# Patient Record
Sex: Female | Born: 1935
Health system: Southern US, Community
[De-identification: ages and names within clinical notes are randomized; demographics above are authoritative.]

## PROBLEM LIST (undated history)

## (undated) DIAGNOSIS — M1990 Unspecified osteoarthritis, unspecified site: Secondary | ICD-10-CM

## (undated) DIAGNOSIS — R3129 Other microscopic hematuria: Secondary | ICD-10-CM

## (undated) DIAGNOSIS — I10 Essential (primary) hypertension: Secondary | ICD-10-CM

## (undated) DIAGNOSIS — N35919 Unspecified urethral stricture, male, unspecified site: Secondary | ICD-10-CM

## (undated) DIAGNOSIS — B373 Candidiasis of vulva and vagina: Secondary | ICD-10-CM

## (undated) DIAGNOSIS — E663 Overweight: Secondary | ICD-10-CM

## (undated) DIAGNOSIS — E119 Type 2 diabetes mellitus without complications: Secondary | ICD-10-CM

## (undated) DIAGNOSIS — C50919 Malignant neoplasm of unspecified site of unspecified female breast: Secondary | ICD-10-CM

## (undated) DIAGNOSIS — B3731 Acute candidiasis of vulva and vagina: Secondary | ICD-10-CM

## (undated) DIAGNOSIS — Z923 Personal history of irradiation: Secondary | ICD-10-CM

## (undated) DIAGNOSIS — N1330 Unspecified hydronephrosis: Secondary | ICD-10-CM

## (undated) DIAGNOSIS — R109 Unspecified abdominal pain: Secondary | ICD-10-CM

## (undated) DIAGNOSIS — N952 Postmenopausal atrophic vaginitis: Secondary | ICD-10-CM

## (undated) DIAGNOSIS — K219 Gastro-esophageal reflux disease without esophagitis: Secondary | ICD-10-CM

## (undated) DIAGNOSIS — N3941 Urge incontinence: Secondary | ICD-10-CM

## (undated) HISTORY — DX: Unspecified hydronephrosis: N13.30

## (undated) HISTORY — DX: Candidiasis of vulva and vagina: B37.3

## (undated) HISTORY — DX: Urge incontinence: N39.41

## (undated) HISTORY — PX: TONSILLECTOMY: SUR1361

## (undated) HISTORY — PX: CATARACT EXTRACTION, BILATERAL: SHX1313

## (undated) HISTORY — DX: Other microscopic hematuria: R31.29

## (undated) HISTORY — DX: Unspecified abdominal pain: R10.9

## (undated) HISTORY — PX: OTHER SURGICAL HISTORY: SHX169

## (undated) HISTORY — DX: Type 2 diabetes mellitus without complications: E11.9

## (undated) HISTORY — PX: ABDOMINAL HYSTERECTOMY: SHX81

## (undated) HISTORY — PX: APPENDECTOMY: SHX54

## (undated) HISTORY — DX: Acute candidiasis of vulva and vagina: B37.31

## (undated) HISTORY — PX: CHOLECYSTECTOMY: SHX55

## (undated) HISTORY — DX: Overweight: E66.3

## (undated) HISTORY — PX: FOOT SURGERY: SHX648

## (undated) HISTORY — PX: GALLBLADDER SURGERY: SHX652

## (undated) HISTORY — DX: Postmenopausal atrophic vaginitis: N95.2

## (undated) HISTORY — DX: Unspecified urethral stricture, male, unspecified site: N35.919

---

## 1998-09-26 ENCOUNTER — Ambulatory Visit (HOSPITAL_COMMUNITY): Admission: RE | Admit: 1998-09-26 | Discharge: 1998-09-26 | Payer: Self-pay | Admitting: Neurosurgery

## 1998-09-26 ENCOUNTER — Encounter: Payer: Self-pay | Admitting: Neurosurgery

## 1998-11-17 ENCOUNTER — Encounter: Payer: Self-pay | Admitting: Neurosurgery

## 1998-11-19 ENCOUNTER — Ambulatory Visit (HOSPITAL_COMMUNITY): Admission: RE | Admit: 1998-11-19 | Discharge: 1998-11-20 | Payer: Self-pay | Admitting: Neurosurgery

## 1998-11-19 ENCOUNTER — Encounter: Payer: Self-pay | Admitting: Neurosurgery

## 2004-03-14 ENCOUNTER — Encounter: Payer: Self-pay | Admitting: Podiatry

## 2004-04-02 ENCOUNTER — Ambulatory Visit: Payer: Self-pay | Admitting: Gastroenterology

## 2004-04-14 ENCOUNTER — Encounter: Payer: Self-pay | Admitting: Podiatry

## 2005-09-01 ENCOUNTER — Ambulatory Visit: Payer: Self-pay | Admitting: Internal Medicine

## 2005-09-23 ENCOUNTER — Other Ambulatory Visit: Payer: Self-pay

## 2005-09-23 ENCOUNTER — Inpatient Hospital Stay: Payer: Self-pay | Admitting: Podiatry

## 2006-03-17 ENCOUNTER — Other Ambulatory Visit: Payer: Self-pay

## 2006-03-17 ENCOUNTER — Ambulatory Visit: Payer: Self-pay | Admitting: Podiatry

## 2006-04-01 ENCOUNTER — Ambulatory Visit: Payer: Self-pay | Admitting: Podiatry

## 2006-09-07 ENCOUNTER — Ambulatory Visit: Payer: Self-pay | Admitting: Internal Medicine

## 2006-09-12 ENCOUNTER — Other Ambulatory Visit: Payer: Self-pay

## 2006-09-12 ENCOUNTER — Ambulatory Visit: Payer: Self-pay | Admitting: General Practice

## 2006-09-22 ENCOUNTER — Inpatient Hospital Stay: Payer: Self-pay | Admitting: General Practice

## 2007-06-30 ENCOUNTER — Ambulatory Visit: Payer: Self-pay | Admitting: Podiatry

## 2007-11-07 ENCOUNTER — Ambulatory Visit: Payer: Self-pay | Admitting: Internal Medicine

## 2008-08-13 ENCOUNTER — Ambulatory Visit: Payer: Self-pay | Admitting: General Practice

## 2008-08-26 ENCOUNTER — Inpatient Hospital Stay: Payer: Self-pay | Admitting: General Practice

## 2008-12-12 ENCOUNTER — Ambulatory Visit: Payer: Self-pay | Admitting: Internal Medicine

## 2009-06-14 DIAGNOSIS — C50919 Malignant neoplasm of unspecified site of unspecified female breast: Secondary | ICD-10-CM

## 2009-06-14 HISTORY — DX: Malignant neoplasm of unspecified site of unspecified female breast: C50.919

## 2009-06-14 HISTORY — PX: BREAST BIOPSY: SHX20

## 2010-02-09 ENCOUNTER — Ambulatory Visit: Payer: Self-pay | Admitting: Internal Medicine

## 2010-02-12 ENCOUNTER — Ambulatory Visit: Payer: Self-pay | Admitting: Internal Medicine

## 2010-03-10 ENCOUNTER — Ambulatory Visit: Payer: Self-pay | Admitting: Surgery

## 2010-03-12 LAB — PATHOLOGY REPORT

## 2010-03-14 ENCOUNTER — Ambulatory Visit: Payer: Self-pay | Admitting: Oncology

## 2010-03-18 ENCOUNTER — Ambulatory Visit: Payer: Self-pay | Admitting: Oncology

## 2010-03-19 LAB — CANCER ANTIGEN 27.29: CA 27.29: 37.7 U/mL (ref 0.0–38.6)

## 2010-03-23 ENCOUNTER — Ambulatory Visit: Payer: Self-pay | Admitting: Internal Medicine

## 2010-04-01 ENCOUNTER — Ambulatory Visit: Payer: Self-pay | Admitting: Surgery

## 2010-04-07 ENCOUNTER — Ambulatory Visit: Payer: Self-pay | Admitting: Surgery

## 2010-04-11 LAB — PATHOLOGY REPORT

## 2010-04-14 ENCOUNTER — Ambulatory Visit: Payer: Self-pay | Admitting: Oncology

## 2010-05-14 ENCOUNTER — Ambulatory Visit: Payer: Self-pay | Admitting: Oncology

## 2010-06-14 ENCOUNTER — Ambulatory Visit: Payer: Self-pay | Admitting: Oncology

## 2010-07-15 ENCOUNTER — Ambulatory Visit: Payer: Self-pay | Admitting: Oncology

## 2010-08-13 ENCOUNTER — Ambulatory Visit: Payer: Self-pay | Admitting: Oncology

## 2010-09-13 ENCOUNTER — Ambulatory Visit: Payer: Self-pay | Admitting: Oncology

## 2010-09-30 ENCOUNTER — Ambulatory Visit: Payer: Self-pay | Admitting: Gastroenterology

## 2010-11-11 ENCOUNTER — Ambulatory Visit: Payer: Self-pay | Admitting: Internal Medicine

## 2010-11-13 ENCOUNTER — Ambulatory Visit: Payer: Self-pay | Admitting: Internal Medicine

## 2011-01-27 ENCOUNTER — Ambulatory Visit: Payer: Self-pay | Admitting: Internal Medicine

## 2011-02-13 ENCOUNTER — Ambulatory Visit: Payer: Self-pay | Admitting: Internal Medicine

## 2011-04-14 ENCOUNTER — Ambulatory Visit: Payer: Self-pay | Admitting: Internal Medicine

## 2011-05-28 ENCOUNTER — Ambulatory Visit: Payer: Self-pay | Admitting: Internal Medicine

## 2011-06-15 ENCOUNTER — Ambulatory Visit: Payer: Self-pay | Admitting: Internal Medicine

## 2011-08-04 ENCOUNTER — Ambulatory Visit: Payer: Self-pay | Admitting: Internal Medicine

## 2011-08-13 ENCOUNTER — Ambulatory Visit: Payer: Self-pay | Admitting: Internal Medicine

## 2011-11-24 ENCOUNTER — Ambulatory Visit: Payer: Self-pay | Admitting: Internal Medicine

## 2011-11-24 LAB — HEPATIC FUNCTION PANEL A (ARMC)
Albumin: 3.7 g/dL (ref 3.4–5.0)
Alkaline Phosphatase: 108 U/L (ref 50–136)
Bilirubin, Direct: 0.1 mg/dL (ref 0.00–0.20)
Bilirubin,Total: 0.3 mg/dL (ref 0.2–1.0)
SGOT(AST): 36 U/L (ref 15–37)
SGPT (ALT): 41 U/L
Total Protein: 7.4 g/dL (ref 6.4–8.2)

## 2011-11-24 LAB — CBC CANCER CENTER
Basophil #: 0 x10 3/mm (ref 0.0–0.1)
Basophil %: 0.6 %
Eosinophil #: 0.2 x10 3/mm (ref 0.0–0.7)
Eosinophil %: 3.1 %
HCT: 36 % (ref 35.0–47.0)
HGB: 12.2 g/dL (ref 12.0–16.0)
Lymphocyte #: 2.1 x10 3/mm (ref 1.0–3.6)
Lymphocyte %: 38.6 %
MCH: 32.1 pg (ref 26.0–34.0)
MCHC: 33.8 g/dL (ref 32.0–36.0)
MCV: 95 fL (ref 80–100)
Monocyte #: 0.4 x10 3/mm (ref 0.2–0.9)
Monocyte %: 8 %
Neutrophil #: 2.7 x10 3/mm (ref 1.4–6.5)
Neutrophil %: 49.7 %
Platelet: 153 x10 3/mm (ref 150–440)
RBC: 3.79 10*6/uL — ABNORMAL LOW (ref 3.80–5.20)
RDW: 13.7 % (ref 11.5–14.5)
WBC: 5.5 x10 3/mm (ref 3.6–11.0)

## 2011-11-24 LAB — CREATININE, SERUM
EGFR (African American): 44 — ABNORMAL LOW
EGFR (Non-African Amer.): 38 — ABNORMAL LOW

## 2011-12-13 ENCOUNTER — Ambulatory Visit: Payer: Self-pay | Admitting: Internal Medicine

## 2012-04-18 ENCOUNTER — Ambulatory Visit: Payer: Self-pay | Admitting: Internal Medicine

## 2012-04-20 ENCOUNTER — Ambulatory Visit: Payer: Self-pay | Admitting: Internal Medicine

## 2012-05-08 ENCOUNTER — Ambulatory Visit: Payer: Self-pay | Admitting: Surgery

## 2012-05-09 LAB — PATHOLOGY REPORT

## 2012-06-25 ENCOUNTER — Ambulatory Visit: Payer: Self-pay | Admitting: Internal Medicine

## 2012-06-26 LAB — HEPATIC FUNCTION PANEL A (ARMC)
Alkaline Phosphatase: 86 U/L (ref 50–136)
Bilirubin,Total: 0.4 mg/dL (ref 0.2–1.0)
SGOT(AST): 40 U/L — ABNORMAL HIGH (ref 15–37)
SGPT (ALT): 40 U/L (ref 12–78)
Total Protein: 7.3 g/dL (ref 6.4–8.2)

## 2012-06-26 LAB — CBC CANCER CENTER
Basophil #: 0 x10 3/mm (ref 0.0–0.1)
Basophil %: 0.5 %
Eosinophil #: 0.2 x10 3/mm (ref 0.0–0.7)
HCT: 35.9 % (ref 35.0–47.0)
HGB: 12.7 g/dL (ref 12.0–16.0)
MCH: 33.6 pg (ref 26.0–34.0)
MCV: 95 fL (ref 80–100)
Platelet: 151 x10 3/mm (ref 150–440)
RBC: 3.77 10*6/uL — ABNORMAL LOW (ref 3.80–5.20)
RDW: 13.8 % (ref 11.5–14.5)
WBC: 5.9 x10 3/mm (ref 3.6–11.0)

## 2012-06-26 LAB — CREATININE, SERUM: Creatinine: 1.32 mg/dL — ABNORMAL HIGH (ref 0.60–1.30)

## 2012-07-15 ENCOUNTER — Ambulatory Visit: Payer: Self-pay | Admitting: Internal Medicine

## 2012-10-11 ENCOUNTER — Ambulatory Visit: Payer: Self-pay

## 2012-11-07 ENCOUNTER — Ambulatory Visit: Payer: Self-pay | Admitting: Surgery

## 2012-11-21 ENCOUNTER — Ambulatory Visit: Payer: Self-pay | Admitting: Podiatry

## 2012-11-21 LAB — BASIC METABOLIC PANEL
Anion Gap: 6 — ABNORMAL LOW (ref 7–16)
Calcium, Total: 9.4 mg/dL (ref 8.5–10.1)
Creatinine: 1.01 mg/dL (ref 0.60–1.30)
EGFR (Non-African Amer.): 54 — ABNORMAL LOW
Osmolality: 289 (ref 275–301)

## 2012-11-22 ENCOUNTER — Ambulatory Visit: Payer: Self-pay | Admitting: Podiatry

## 2012-11-24 LAB — PATHOLOGY REPORT

## 2013-02-07 ENCOUNTER — Ambulatory Visit: Payer: Self-pay | Admitting: Ophthalmology

## 2013-02-07 DIAGNOSIS — I10 Essential (primary) hypertension: Secondary | ICD-10-CM

## 2013-02-19 ENCOUNTER — Ambulatory Visit: Payer: Self-pay | Admitting: Ophthalmology

## 2013-02-23 ENCOUNTER — Ambulatory Visit: Payer: Self-pay | Admitting: Internal Medicine

## 2013-02-26 LAB — CBC CANCER CENTER
Eosinophil %: 4.4 %
HCT: 36.4 % (ref 35.0–47.0)
HGB: 12.7 g/dL (ref 12.0–16.0)
Lymphocyte #: 1.8 x10 3/mm (ref 1.0–3.6)
Lymphocyte %: 32.7 %
MCH: 33.6 pg (ref 26.0–34.0)
MCHC: 35 g/dL (ref 32.0–36.0)
Neutrophil #: 3.1 x10 3/mm (ref 1.4–6.5)
Platelet: 156 x10 3/mm (ref 150–440)
RDW: 14.3 % (ref 11.5–14.5)

## 2013-02-26 LAB — CREATININE, SERUM
EGFR (African American): 39 — ABNORMAL LOW
EGFR (Non-African Amer.): 34 — ABNORMAL LOW

## 2013-02-26 LAB — HEPATIC FUNCTION PANEL A (ARMC)
Albumin: 3.9 g/dL (ref 3.4–5.0)
Alkaline Phosphatase: 100 U/L (ref 50–136)
Bilirubin, Direct: 0.1 mg/dL (ref 0.00–0.20)
Bilirubin,Total: 0.4 mg/dL (ref 0.2–1.0)
SGOT(AST): 41 U/L — ABNORMAL HIGH (ref 15–37)

## 2013-03-08 ENCOUNTER — Ambulatory Visit: Payer: Self-pay | Admitting: Ophthalmology

## 2013-03-14 ENCOUNTER — Ambulatory Visit: Payer: Self-pay | Admitting: Internal Medicine

## 2013-03-19 ENCOUNTER — Ambulatory Visit: Payer: Self-pay | Admitting: Ophthalmology

## 2013-04-19 ENCOUNTER — Ambulatory Visit: Payer: Self-pay | Admitting: Internal Medicine

## 2013-06-29 ENCOUNTER — Ambulatory Visit: Payer: Self-pay | Admitting: Urology

## 2013-07-18 DIAGNOSIS — Z853 Personal history of malignant neoplasm of breast: Secondary | ICD-10-CM | POA: Insufficient documentation

## 2014-03-06 ENCOUNTER — Ambulatory Visit: Payer: Self-pay | Admitting: Internal Medicine

## 2014-03-06 LAB — HEPATIC FUNCTION PANEL A (ARMC)
Albumin: 3.5 g/dL (ref 3.4–5.0)
Alkaline Phosphatase: 89 U/L
BILIRUBIN TOTAL: 0.4 mg/dL (ref 0.2–1.0)
Bilirubin, Direct: <0.05 mg/dL (ref 0.00–0.20)
SGOT(AST): 35 U/L (ref 15–37)
SGPT (ALT): 45 U/L
TOTAL PROTEIN: 7.1 g/dL (ref 6.4–8.2)

## 2014-03-06 LAB — CREATININE, SERUM
Creatinine: 1.44 mg/dL — ABNORMAL HIGH (ref 0.60–1.30)
EGFR (African American): 46 — ABNORMAL LOW
EGFR (Non-African Amer.): 38 — ABNORMAL LOW

## 2014-03-06 LAB — CBC CANCER CENTER
BASOS ABS: 0 x10 3/mm (ref 0.0–0.1)
Basophil %: 0.6 %
EOS ABS: 0.2 x10 3/mm (ref 0.0–0.7)
EOS PCT: 3.9 %
HCT: 36.9 % (ref 35.0–47.0)
HGB: 12.4 g/dL (ref 12.0–16.0)
Lymphocyte #: 2.1 x10 3/mm (ref 1.0–3.6)
Lymphocyte %: 34.3 %
MCH: 32.3 pg (ref 26.0–34.0)
MCHC: 33.6 g/dL (ref 32.0–36.0)
MCV: 96 fL (ref 80–100)
MONO ABS: 0.4 x10 3/mm (ref 0.2–0.9)
Monocyte %: 7.1 %
Neutrophil #: 3.4 x10 3/mm (ref 1.4–6.5)
Neutrophil %: 54.1 %
Platelet: 178 x10 3/mm (ref 150–440)
RBC: 3.85 10*6/uL (ref 3.80–5.20)
RDW: 14.6 % — ABNORMAL HIGH (ref 11.5–14.5)
WBC: 6.2 x10 3/mm (ref 3.6–11.0)

## 2014-03-14 ENCOUNTER — Ambulatory Visit: Payer: Self-pay | Admitting: Internal Medicine

## 2014-04-22 ENCOUNTER — Ambulatory Visit: Payer: Self-pay | Admitting: Internal Medicine

## 2014-10-04 NOTE — Op Note (Signed)
PATIENT NAME:  Danielle Nixon, Danielle Nixon MR#:  [id number] DATE OF BIRTH:  Oct 20, 1935  DATE OF PROCEDURE:  02/19/2013  PREOPERATIVE DIAGNOSIS:  Cataract, left eye.    POSTOPERATIVE DIAGNOSIS:  Cataract, left eye.  PROCEDURE PERFORMED: Extracapsular cataract extraction using phacoemulsification with placement Alcon [id number], 21.0-diopter posterior chamber lens, serial number [id number].   SURGEON: Loura Back. Sharlee Rufino, M.D.   ANESTHESIA: 4% lidocaine and 0.75% Marcaine. A 50:50 mixture with 10 units/mL of HyoMax added, given this peribulbar.   COMPLICATIONS: None.   ESTIMATED BLOOD LOSS: Less than 1 mL.   ANESTHESIOLOGIST:  Dr. Andree Elk.  COMPLICATIONS:  None.  ESTIMATED BLOOD LOSS:  Less than 1 ml.  DESCRIPTION OF PROCEDURE:  The patient was brought to the operating room and given a peribulbar block.  The patient was then prepped and draped in the usual fashion.  The vertical rectus muscles were imbricated using 5-0 silk sutures.  These sutures were then clamped to the sterile drapes as bridle sutures.  A limbal peritomy was performed extending two clock hours and hemostasis was obtained with cautery.  A partial thickness scleral groove was made at the surgical limbus and dissected anteriorly in a lamellar dissection using an Alcon crescent knife.  The anterior chamber was entered supero-temporally with a Superblade and through the lamellar dissection with a 2.6 mm keratome.  DisCoVisc was used to replace the aqueous and a continuous tear capsulorrhexis was carried out.  Hydrodissection and hydrodelineation were carried out with balanced salt and a 27 gauge canula.  The nucleus was rotated to confirm the effectiveness of the hydrodissection.  Phacoemulsification was carried out using a divide-and-conquer technique.  Total ultrasound time was 1 minute and 6 seconds with an average power of 22.4%. A CDE of 26.30. No suture was placed .  Irrigation/aspiration was used to remove the residual cortex.   DisCoVisc was used to inflate the capsule and the internal incision was enlarged to 3 mm with the crescent knife.  The intraocular lens was folded and inserted into the capsular bag using the AcrySert delivery system.  Irrigation/aspiration was used to remove the residual DisCoVisc.  Miostat was injected into the anterior chamber through the paracentesis track to inflate the anterior chamber and induce miosis. One tenth of one mL of Cefuroxime delivery 1 mg of drug was placed in the anterior chamber.  The wound was checked for leaks and none were found. The conjunctiva was closed with cautery and the bridle sutures were removed.  Two drops of 0.3% Vigamox were placed on the eye.   An eye shield was placed on the eye.  The patient was discharged to the recovery room in good condition.   ____________________________ Loura Back Jovee Dettinger, MD sad:np D: 02/19/2013 14:05:37 ET T: 02/19/2013 15:30:44 ET JOB#: [id number]  cc: Remo Lipps A. Keelon Zurn, MD, <Dictator> Martie Lee MD ELECTRONICALLY SIGNED 02/26/2013 13:12

## 2014-10-04 NOTE — Op Note (Signed)
PATIENT NAME:  Danielle, Nixon MR#:  [id number] DATE OF BIRTH:  06-Jul-1935  DATE OF PROCEDURE:  11/22/2012  PREOPERATIVE DIAGNOSES: 1.  Equinus right lower extremity.  2.  Ulcer, right plantar first metatarsophalangeal joint.   POSTOPERATIVE DIAGNOSES:   1.  Equinus right lower extremity.  2.  Ulcer, right plantar first metatarsophalangeal joint.   PROCEDURES: 1.  Strayer gastroc recession right leg.  2.  Excision tibial sesamoid, right first metatarsophalangeal joint with debridement of metatarsal head.   ANESTHESIA: IV sedation with local.   HEMOSTASIS: Epinephrine 1: 200,000 infiltrated along both incision sites.   COMPLICATIONS: None.   SPECIMEN: Tibial sesamoid, for pathology.   OPERATIVE INDICATIONS: This is a 79 year old female who I have seen in the outpatient clinic with a chronic nonhealing right great toe joint ulceration. She has undergone extensive conservative treatment and presents today for surgery. All risks, benefits, alternatives, and complications associated with surgery were discussed with the patient in full and consent has been given.   OPERATIVE PROCEDURE: The patient was brought into the operating room and placed on the operating table in the supine position. IV sedation was administered by the anesthesia team. The patient was then placed on the OR table in the prone position. The posterior aspect of the calf and plantar aspect of the foot was infiltrated with a total of 14 mL of 1% lidocaine with epinephrine. The right lower extremity was then prepped and draped in the usual sterile fashion. Attention was directed to the posterior aspect of the calf, where a longitudinal incision was made at the gastroc soleal junction. Sharp and blunt dissection was taken down to the peritenon. The small saphenous and sural nerves were noted and retracted throughout the procedure. The paratenon was then resected. The gastroc soleal junction was noted and a transfer Strayer  recession was performed with the deep muscle belly noted at this time. The wound was flushed with copious amounts of irrigation. Layered closure was performed with  4-0 Vicryl for the peritenon, 4-0 Vicryl for the subcutaneous tissue and a 4-0 nylon for the skin. A dressing was then applied overlying this area. Attention was then directed to the plantar aspect of the right first metatarsophalangeal joint where a longitudinal incision was made over the tibial sesamoid region. Sharp and blunt dissection was carried down to the deeper layers. The long flexor tendon was noted at this time and retracted throughout the procedure. Next, the tibial sesamoid was then noted and all soft tissues were sharply removed away. A small cuff of the plantar aspect of the tibial sesamoid was left intact to reanastomose this area. The tibial sesamoid was then removed in toto. These joint was flushed. There was a small prominent bone  in the area and I debrided this down with a rasp. This wound was then flushed with copious amounts of irrigation and layered closure was performed with a 4-0 Vicryl for the deeper and subcutaneous tissue and a 3-0 nylon for skin. A well compressive sterile dressing was placed on the patient's right foot and she was then placed in an equalizer walker boot. She will remain nonweightbearing to her right foot and given the fact she has plantar ulcer. I will see her in the outpatient clinic in 5 to 7 days. She is to call if there are any questions in the interim    ____________________________ Pete Glatter. Vickki Muff, DPM jaf:cc D: 11/22/2012 14:52:34 ET T: 11/22/2012 19:44:26 ET JOB#: [id number]  cc: Larkin Ina A. Vickki Muff, DPM, <Dictator> Margreat Widener  Deashia Soule DPM ELECTRONICALLY SIGNED 11/29/2012 11:52

## 2014-10-04 NOTE — Op Note (Signed)
PATIENT NAME:  Danielle Nixon, Danielle Nixon MR#:  [id number] DATE OF BIRTH:  06-Mar-1936  DATE OF PROCEDURE:  03/19/2013  PREOPERATIVE DIAGNOSIS: Cataract, right eye.   POSTOPERATIVE DIAGNOSIS: Cataract, right eye.   PROCEDURE PERFORMED: Extracapsular cataract extraction using phacoemulsification with placement of an Alcon SN6CWS, 21.0-diopter posterior chamber lens, serial number [id number].   ANESTHESIA: 4% lidocaine 0.75% Marcaine a 50/50 mixture with 10 units/mL of Hylenex  added given as a peribulbar.   ANESTHESIOLOGIST: Dr. Benjamine Mola.   COMPLICATIONS: None.   ESTIMATED BLOOD LOSS: Less than 1 mL.   SURGEON:  Loura Back. Tamatha Gadbois, MD  ASSISTANT:  None.  COMPLICATIONS:  None.  ESTIMATED BLOOD LOSS:  Less than 1 ml.  DESCRIPTION OF PROCEDURE:  The patient was brought to the operating room and given a peribulbar block.  The patient was then prepped and draped in the usual fashion.  The vertical rectus muscles were imbricated using 5-0 silk sutures.  These sutures were then clamped to the sterile drapes as bridle sutures.  A limbal peritomy was performed extending 2 clock-hours and hemostasis was obtained with cautery.  A partial-thickness scleral groove was made at the surgical limbus and then dissected anteriorly in a lamellar dissection with using an Alcon crescent knife.  The anterior chamber was entered superonasally with a Superblade and through the lamellar dissection with a 2.6-mm keratome.  DisCoVisc was used to replace the aqueous and a continuous tear capsulorrhexis was carried out.  Hydrodissection and hydrodelineation were carried out with balanced salt and a 27-gauge cannula.  The nucleus was rotated to confirm the effectiveness of the hydrodissection.  Phacoemulsification was carried out using a divide-and-conquer technique.  Ultrasound time was 1 minute and 1 second with an average power of 22.8%. CDE of 24.27. No suture was placed. An AcrySert delivery system was used.    Irrigation/aspiration was used to remove the residual cortex.  DisCoVisc was used to inflate the capsule and the internal wound was enlarged to 3 mm with the crescent knife.  The intraocular lens was inserted into the capsular bag using the [employer].  Irrigation/aspiration was used to remove the residual DisCoVisc.  Miostat was injected into the anterior chamber through the paracentesis track to inflate the anterior chamber and induce miosis.  The wound was checked for leaks and wound leakage was found.  A single 10-0 suture was placed across the incision, tied and the knot was rotated superiorly.  The conjunctiva was closed with cautery and the bridle sutures were removed.  Two drops of 0.3% Vigamox were placed on the eye.  An eye shield was placed on the eye.  The patient was discharged to the recovery room in good condition.  No antibiotics were injected.    ____________________________ Loura Back. Gean Larose, MD sad:dm D: 03/19/2013 13:13:02 ET T: 03/19/2013 14:04:34 ET JOB#: [id number]  cc: Remo Lipps A. Ardie Dragoo, MD, <Dictator> Martie Lee MD ELECTRONICALLY SIGNED 04/02/2013 12:25

## 2015-03-07 ENCOUNTER — Inpatient Hospital Stay: Payer: Commercial Managed Care - HMO

## 2015-03-07 ENCOUNTER — Inpatient Hospital Stay: Payer: Commercial Managed Care - HMO | Admitting: Internal Medicine

## 2015-03-14 ENCOUNTER — Inpatient Hospital Stay: Payer: Commercial Managed Care - HMO | Attending: Internal Medicine

## 2015-03-14 ENCOUNTER — Other Ambulatory Visit: Payer: Self-pay | Admitting: *Deleted

## 2015-03-14 ENCOUNTER — Inpatient Hospital Stay (HOSPITAL_BASED_OUTPATIENT_CLINIC_OR_DEPARTMENT_OTHER): Payer: Commercial Managed Care - HMO | Admitting: Internal Medicine

## 2015-03-14 VITALS — BP 118/78 | HR 112 | Temp 98.4°F | Resp 18 | Ht 69.0 in | Wt 225.5 lb

## 2015-03-14 DIAGNOSIS — E669 Obesity, unspecified: Secondary | ICD-10-CM | POA: Insufficient documentation

## 2015-03-14 DIAGNOSIS — I10 Essential (primary) hypertension: Secondary | ICD-10-CM | POA: Insufficient documentation

## 2015-03-14 DIAGNOSIS — Z79811 Long term (current) use of aromatase inhibitors: Secondary | ICD-10-CM | POA: Insufficient documentation

## 2015-03-14 DIAGNOSIS — M8580 Other specified disorders of bone density and structure, unspecified site: Secondary | ICD-10-CM

## 2015-03-14 DIAGNOSIS — Z170 Estrogen receptor positive status [ER+]: Secondary | ICD-10-CM | POA: Diagnosis not present

## 2015-03-14 DIAGNOSIS — E785 Hyperlipidemia, unspecified: Secondary | ICD-10-CM | POA: Diagnosis not present

## 2015-03-14 DIAGNOSIS — C50911 Malignant neoplasm of unspecified site of right female breast: Secondary | ICD-10-CM

## 2015-03-14 LAB — CBC WITH DIFFERENTIAL/PLATELET
BASOS ABS: 0 10*3/uL (ref 0–0.1)
Basophils Relative: 1 %
Eosinophils Absolute: 0.2 10*3/uL (ref 0–0.7)
Eosinophils Relative: 4 %
HEMATOCRIT: 36.3 % (ref 35.0–47.0)
HEMOGLOBIN: 12.5 g/dL (ref 12.0–16.0)
LYMPHS PCT: 33 %
Lymphs Abs: 2 10*3/uL (ref 1.0–3.6)
MCH: 32 pg (ref 26.0–34.0)
MCHC: 34.5 g/dL (ref 32.0–36.0)
MCV: 92.6 fL (ref 80.0–100.0)
MONO ABS: 0.5 10*3/uL (ref 0.2–0.9)
MONOS PCT: 9 %
NEUTROS ABS: 3.3 10*3/uL (ref 1.4–6.5)
Neutrophils Relative %: 53 %
Platelets: 179 10*3/uL (ref 150–440)
RBC: 3.92 MIL/uL (ref 3.80–5.20)
RDW: 14.2 % (ref 11.5–14.5)
WBC: 6.1 10*3/uL (ref 3.6–11.0)

## 2015-03-14 LAB — CREATININE, SERUM
Creatinine, Ser: 1.27 mg/dL — ABNORMAL HIGH (ref 0.44–1.00)
GFR calc Af Amer: 46 mL/min — ABNORMAL LOW (ref >60)
GFR calc non Af Amer: 40 mL/min — ABNORMAL LOW (ref >60)

## 2015-03-14 LAB — HEPATIC FUNCTION PANEL
ALK PHOS: 76 U/L (ref 38–126)
ALT: 25 U/L (ref 14–54)
AST: 30 U/L (ref 15–41)
Albumin: 4.2 g/dL (ref 3.5–5.0)
BILIRUBIN TOTAL: 0.6 mg/dL (ref 0.3–1.2)
Total Protein: 7.6 g/dL (ref 6.5–8.1)

## 2015-03-14 NOTE — Progress Notes (Signed)
Patient is here for follow-up of breast cancer. She states that she still has bad neuropathy, but her medication is helping. She states that she forgot to take it one night and her neuropathy was very bad, so she know that her medicine is working.

## 2015-03-23 NOTE — Progress Notes (Signed)
[facility]  [phone] [phone]     ID: Danielle Nixon OB: 1936/02/18  MR#: [id number]  GDJ#:[id number]  Patient Care Team: Lottie Mussel III, MD as PCP - General (Internal Medicine)  CHIEF COMPLAINT/DIAGNOSIS:  pT2 pN0 (sn) cM0 invasive ductal carcinoma of the right breast status post lumpectomy and sentinel node study on April 07, 2010. Tumor size 2.7 cm, margins negative, grade 1.  Two sentinel lymph nodes negative. ER and PR positive. HER-2/neu negative (2+ on IHC, FISH negative, low Her2/CEP17 ratio of 1.15). Oncotype DX test on 04/22/10 showed low-risk recurrence score of 11 (average rate of distant recurrence of 7%). Started aromatase inhibitor in Feb 2012.   HPI:   Patient here for continued oncology follow-up for history of breast cancer as described above, she was last seen 1 year ago. Overall states that she is doing fairly steady. She denies feeling any new breast masses on self exam.  She is on anastrozole, denies any new side effects from it. Appetite and weight are steady. Denies any new bone pains, has chronic arthritis which is unchanged. Denies feeling new breast masses on self-exam. No new DOE, cough or chest pain.  Denies new mood disturbances.   Review of Systems   As in HPI above. In addition, no fevers, chills or night sweats. No sore throat or dysphagia.  No new cough, shortness of breath, sputum, hemoptysis or chest pain. No abdominal pain, constipation, diarrhea, dysuria or hematuria. No new bone pain. No skin rash or bleeding symptoms. Has chronic peripheral neuropathy. PS ECOG 1. Past Medical History/Past Surgical History -  Hypertension  Hyperlipidemia  Chronic peripheral neuropathy of unclear etiology (since 1995, affects legs/feet more than hands)  Obesity  Hysterectomy  Cholecystectomy  Appendectomy  Lumbar laminectomy  Status post total bilateral knee replacement  Previous left urethra stricture    Family  History -  Denies breast, ovarian or colon cancers.  Remarkable for diabetes, hypertension and heart disease.    Social History -  Denies smoking or alcohol use.  Ambulates slowly because of peripheral neuropathy.   Allergies:  Penicillin: Rash  Sulfa: Rash  Cipro: Rash  Sudafed: Agitation, Other  Macrobid: Rash  Codeine: Unknown    ROS Current Outpatient Prescriptions  Medication Sig Dispense Refill  . amitriptyline (ELAVIL) 50 MG tablet TAKE 1 TABLET EVERY DAY    . anastrozole (ARIMIDEX) 1 MG tablet Take by mouth.    Marland Kitchen atorvastatin (LIPITOR) 10 MG tablet TAKE 1 TABLET ONE TIME DAILY  FOR  CHOLESTEROL    . gabapentin (NEURONTIN) 300 MG capsule TAKE 2 CAPSULES THREE TIMES DAILY    . tetracycline (ACHROMYCIN,SUMYCIN) 250 MG capsule TAKE 1 CAPSULE (250 MG TOTAL) BY MOUTH 4 (FOUR) TIMES DAILY.  0  . valsartan-hydrochlorothiazide (DIOVAN-HCT) 80-12.5 MG tablet TAKE 1 TABLET EVERY DAY     No current facility-administered medications for this visit.    PHYSICAL EXAM: Filed Vitals:   03/14/15 1436  BP: 118/78  Pulse: 112  Temp: 98.4 F (36.9 C)  Resp: 18     Body mass index is 33.29 kg/(m^2).      GENERAL: Patient is alert and oriented and in no acute distress. There is no icterus. HEENT: EOMs intact. No cervical lymphadenopathy. CVS: S1S2, regular LUNGS: Bilaterally clear to auscultation, no rhonchi. ABDOMEN: Soft, nontender. No hepatomegaly clinically.  EXTREMITIES: No pedal edema. BREASTS: no abnormal masses in either breast. No axillary adenopathy. Exam performed in presence of a nurse.   LAB  RESULTS:    Component Value Date/Time   NA 141 11/21/2012 1417   K 4.3 03/08/2013 1154   CL 106 11/21/2012 1417   CO2 29 11/21/2012 1417   GLUCOSE 135* 11/21/2012 1417   BUN 30* 11/21/2012 1417   CREATININE 1.27* 03/14/2015 1416   CREATININE 1.44* 03/06/2014 1415   CALCIUM 9.4 11/21/2012 1417   PROT 7.6 03/14/2015 1416   PROT 7.1 03/06/2014 1415   ALBUMIN 4.2 03/14/2015  1416   ALBUMIN 3.5 03/06/2014 1415   AST 30 03/14/2015 1416   AST 35 03/06/2014 1415   ALT 25 03/14/2015 1416   ALT 45 03/06/2014 1415   ALKPHOS 76 03/14/2015 1416   ALKPHOS 89 03/06/2014 1415   BILITOT 0.6 03/14/2015 1416   BILITOT 0.4 03/06/2014 1415   GFRNONAA 40* 03/14/2015 1416   GFRNONAA 38* 03/06/2014 1415   GFRNONAA 34* 02/26/2013 1404   GFRAA 46* 03/14/2015 1416   GFRAA 46* 03/06/2014 1415   GFRAA 39* 02/26/2013 1404    Lab Results  Component Value Date   WBC 6.1 03/14/2015   NEUTROABS 3.3 03/14/2015   HGB 12.5 03/14/2015   HCT 36.3 03/14/2015   MCV 92.6 03/14/2015   PLT 179 03/14/2015    STUDIES: 04/22/14 - Mammogram.  IMPRESSION: No evidence of recurrent or new malignancy. Benign postsurgical changes on the right. RECOMMENDATION: Diagnostic mammography in 1 year per standard post lumpectomy protocol.  BI-RADS CATEGORY  2: Benign Finding(s)   ASSESSMENT / PLAN:   pT2 pN0 (sn) cM0 invasive ductal carcinoma of the right breast status post lumpectomy and sentinel node study on April 07, 2010. Tumor size 2.7 cm, margins negative, grade 1.  Two sentinel lymph nodes negative. ER and PR positive, HER-2/neu negative (2+ on IHC with negative FISH). Oncotype DX test on 04/22/10 showed low-risk recurrence score of 11 (average rate of distant recurrence of 7%).  Started aromatase inhibitor in Feb 2012.  -    reviewed labs and discussed with patient. She continues to do well without any clinical evidence to suggest recurrent or metastatic breast cancer. She underwent a right breast biopsy November 2013 for microcalcifications which was negative for atypia or malignancy. She also had followup mammogram in Nov 2015 which was reported benign findings, BI-RADS 2.  Patient is otherwise tolerating anastrazole well without any new side effects, plan is to continue on hormonal therapy with anastrazole 1 mg daily, have advised her to stop this after February 2017 since she will complete 5 years  of planned treatment. She was advised to continue calcium + vitamin D BID. Prior DEXA scan showed osteopenia, states that she is following with primary physician regarding this. We will request next mammogram for November 2016. Will see her back in one year with repeat labs including CBC, creatinine, LFT. In between visits, she was advised to call in case of any new breast masses on self-exam or new side effects from anastrazole. Patient is agreeable to this plan.   Leia Alf, MD   03/23/2015 4:24 PM

## 2015-06-17 ENCOUNTER — Ambulatory Visit (INDEPENDENT_AMBULATORY_CARE_PROVIDER_SITE_OTHER): Payer: PPO | Admitting: Urology

## 2015-06-17 ENCOUNTER — Encounter: Payer: Self-pay | Admitting: *Deleted

## 2015-06-17 VITALS — BP 136/83 | HR 120 | Ht 69.0 in | Wt 228.0 lb

## 2015-06-17 DIAGNOSIS — N390 Urinary tract infection, site not specified: Secondary | ICD-10-CM | POA: Diagnosis not present

## 2015-06-17 DIAGNOSIS — R3915 Urgency of urination: Secondary | ICD-10-CM | POA: Diagnosis not present

## 2015-06-17 DIAGNOSIS — R32 Unspecified urinary incontinence: Secondary | ICD-10-CM

## 2015-06-17 DIAGNOSIS — N952 Postmenopausal atrophic vaginitis: Secondary | ICD-10-CM | POA: Diagnosis not present

## 2015-06-17 DIAGNOSIS — N3941 Urge incontinence: Secondary | ICD-10-CM | POA: Diagnosis not present

## 2015-06-17 LAB — URINALYSIS, COMPLETE
Bilirubin, UA: NEGATIVE
Glucose, UA: NEGATIVE
KETONES UA: NEGATIVE
NITRITE UA: POSITIVE — AB
PH UA: 6 (ref 5.0–7.5)
Protein, UA: NEGATIVE
Specific Gravity, UA: 1.02 (ref 1.005–1.030)
Urobilinogen, Ur: 0.2 mg/dL (ref 0.2–1.0)

## 2015-06-17 LAB — MICROSCOPIC EXAMINATION: RBC MICROSCOPIC, UA: NONE SEEN /HPF (ref 0–?)

## 2015-06-17 LAB — BLADDER SCAN AMB NON-IMAGING: Scan Result: 23

## 2015-06-17 NOTE — Progress Notes (Signed)
06/17/2015 12:06 PM   Rae Lips 1936/01/17 [id number]:[id number]  Referring provider: Madelyn Brunner, MD [facility] [place], [place] [address]  Chief Complaint  Patient presents with  . Urinary Incontinence    1 year recheck    HPI: Patient is a 80 year old Caucasian female who presents early for her one year follow up due to UTI symptoms.    Patient states that last week she developed the acute onset of dysuria.  The dysuria occurred consistently for three days and she contacted her PCP, Dr. Gilford Rile, and he phoned in tetracycline.  She had been on the antibiotic for three days with minimal improvement in her urinary symptoms.  She has since stopped the antibiotic.  She has not had fevers, chills, nausea or vomiting.  She is not experiencing gross hematuria or suprapubic pain.  Her UA today is nitrate positive.    Patient also has a history of urge incontinence for which she was given Vesicare samples.  She does not recall if the Vesicare samples were effective.  She is still experiencing severe urge incontinence.  She states as soon as her feet hit the floor to use the restroom, she leaks.  This is very frustrating to her.  Her PVR's have been minimal.  Today's PVR is 23 mL.    She has a history of atrophic vaginitis and recurrent UTI's, but she has not been using her vaginal estrogen cream consistently.     PMH: Past Medical History  Diagnosis Date  . Heart disease   . Flank pain   . DM (diabetes mellitus) ([place])   . Urge incontinence   . Atrophic vaginitis   . Vaginal yeast infection   . Over weight   . Hydronephrosis   . Hematuria, microscopic   . Urethral stricture     Surgical History: Past Surgical History  Procedure Laterality Date  . Tonsillectomy    . Dixon flap ureteroscopy    . Gallbladder surgery    . Appendectomy    . Foot surgery    . Abdominal hysterectomy      Home Medications:    Medication List       This list  is accurate as of: 06/17/15 12:06 PM.  Always use your most recent med list.               amitriptyline 50 MG tablet  Commonly known as:  ELAVIL  TAKE 1 TABLET EVERY DAY     anastrozole 1 MG tablet  Commonly known as:  ARIMIDEX  Take by mouth.     atorvastatin 10 MG tablet  Commonly known as:  LIPITOR  TAKE 1 TABLET ONE TIME DAILY  FOR  CHOLESTEROL     gabapentin 300 MG capsule  Commonly known as:  NEURONTIN  TAKE 2 CAPSULES THREE TIMES DAILY     tetracycline 250 MG capsule  Commonly known as:  ACHROMYCIN,SUMYCIN  TAKE 1 CAPSULE (250 MG TOTAL) BY MOUTH 4 (FOUR) TIMES DAILY.     valsartan-hydrochlorothiazide 80-12.5 MG tablet  Commonly known as:  DIOVAN-HCT  TAKE 1 TABLET EVERY DAY        Allergies:  Allergies  Allergen Reactions  . Ciprofloxacin Nausea And Vomiting  . Doxycycline Other (See Comments)  . Nitrofurantoin Monohyd Macro Other (See Comments)  . Nsaids Other (See Comments)  . Penicillins Other (See Comments)    Has taken recently without problems  . Pseudoephedrine Hcl Other (See Comments)  .  Sulfa Antibiotics   . Terazosin Other (See Comments)    Family History: Family History  Problem Relation Age of Onset  . Diabetes    . Breast cancer    . Heart disease    . Kidney disease Neg Hx   . Bladder Cancer Neg Hx     Social History:  reports that she has never smoked. She does not have any smokeless tobacco history on file. She reports that she does not drink alcohol or use illicit drugs.  ROS: UROLOGY Frequent Urination?: Yes Hard to postpone urination?: Yes Burning/pain with urination?: Yes Get up at night to urinate?: No Leakage of urine?: Yes Urine stream starts and stops?: No Trouble starting stream?: No Do you have to strain to urinate?: No Blood in urine?: No Urinary tract infection?: Yes Sexually transmitted disease?: No Injury to kidneys or bladder?: No Painful intercourse?: No Weak stream?: No Currently pregnant?: No Vaginal  bleeding?: No Last menstrual period?: n  Gastrointestinal Nausea?: No Vomiting?: No Indigestion/heartburn?: Yes Diarrhea?: No Constipation?: No  Constitutional Fever: No Night sweats?: No Weight loss?: No Fatigue?: Yes  Skin Skin rash/lesions?: No Itching?: No  Eyes Blurred vision?: No Double vision?: No  Ears/Nose/Throat Sore throat?: No Sinus problems?: No  Hematologic/Lymphatic Swollen glands?: No Easy bruising?: No  Cardiovascular Leg swelling?: No Chest pain?: No  Respiratory Cough?: Yes Shortness of breath?: No  Endocrine Excessive thirst?: No  Musculoskeletal Back pain?: No Joint pain?: No  Neurological Headaches?: No Dizziness?: No  Psychologic Depression?: No Anxiety?: No  Physical Exam: BP 136/83 mmHg  Pulse 120  Ht 5\' 9"  (1.753 m)  Wt 228 lb (103.42 kg)  BMI 33.65 kg/m2  Constitutional: Well nourished. Alert and oriented, No acute distress. HEENT: [place] AT, moist mucus membranes. Trachea midline, no masses. Cardiovascular: No clubbing, cyanosis, or edema. Respiratory: Normal respiratory effort, no increased work of breathing. GI: Abdomen is soft, non tender, non distended, no abdominal masses. Liver and spleen not palpable.  No hernias appreciated.  Stool sample for occult testing is not indicated.   GU: No CVA tenderness.  No bladder fullness or masses.  Skin: No rashes, bruises or suspicious lesions. Lymph: No cervical or inguinal adenopathy. Neurologic: Grossly intact, no focal deficits, moving all 4 extremities. Psychiatric: Normal mood and affect.  Laboratory Data: Lab Results  Component Value Date   WBC 6.1 03/14/2015   HGB 12.5 03/14/2015   HCT 36.3 03/14/2015   MCV 92.6 03/14/2015   PLT 179 03/14/2015    Lab Results  Component Value Date   CREATININE 1.27* 03/14/2015    Urinalysis Results for orders placed or performed in visit on 06/17/15  Microscopic Examination  Result Value Ref Range   WBC, UA 11-30 (A) 0  -  5 /hpf   RBC, UA None seen 0 -  2 /hpf   Epithelial Cells (non renal) 0-10 0 - 10 /hpf   Mucus, UA Present (A) Not Estab.   Bacteria, UA Few (A) None seen/Few  Urinalysis, Complete  Result Value Ref Range   Specific Gravity, UA 1.020 1.005 - 1.030   pH, UA 6.0 5.0 - 7.5   Color, UA Yellow Yellow   Appearance Ur Clear Clear   Leukocytes, UA 2+ (A) Negative   Protein, UA Negative Negative/Trace   Glucose, UA Negative Negative   Ketones, UA Negative Negative   RBC, UA Trace (A) Negative   Bilirubin, UA Negative Negative   Urobilinogen, Ur 0.2 0.2 - 1.0 mg/dL   Nitrite, UA  Positive (A) Negative   Microscopic Examination See below:   BLADDER SCAN AMB NON-IMAGING  Result Value Ref Range   Scan Result 23     Pertinent Imaging: Results for CARMELITE, SHARE (MRN [id number]) as of 06/17/2015 14:14  Ref. Range 06/17/2015 11:45  Scan Result Unknown 23    Assessment & Plan:    1. UTI:    Patient is with UTI symptoms and has been on an antibiotic without improvement.  Her UA is suspicious of an infection.  I will send the urine for culture and she will continue the tetracycline until culture results are available.    - Urinalysis, Complete - CULTURE, URINE COMPREHENSIVE  2. Urge incontinence:   Patient's PVR's have been minimal.  She cannot recall the effect of the Vesicare.  I will start Myrbetriq 25 mg daily.  Samples are given to the patient.  She will follow up in one month for PVR and symptom recheck.    - BLADDER SCAN AMB NON-IMAGING  3. Atrophic vaginitis:   Patient has not been using her vaginal estrogen cream consistently.  I have given her samples of the Premarin cream.  We will exam her when she returns in one month.    Return in about 1 month (around 07/18/2015) for PVR, UA and exam.  Zara Council, PA-C  [facility] [address] [place], [place] [address] [phone]

## 2015-06-19 ENCOUNTER — Telehealth: Payer: Self-pay

## 2015-06-19 DIAGNOSIS — N390 Urinary tract infection, site not specified: Secondary | ICD-10-CM

## 2015-06-19 LAB — CULTURE, URINE COMPREHENSIVE

## 2015-06-19 MED ORDER — FOSFOMYCIN TROMETHAMINE 3 G PO PACK: PACK | ORAL | Status: DC

## 2015-06-19 NOTE — Telephone Encounter (Signed)
-----   Message from Nori Riis, PA-C sent at 06/19/2015  1:19 PM EST ----- Patient has a positive urine culture, unfortunately she is allergic to many antibiotics.  I would like to try Monurol 3 gm, every other day for three doses then recheck her urine 3 to 5 days after she finishes her antibiotic.

## 2015-06-19 NOTE — Telephone Encounter (Signed)
Spoke with pt in reference to +ucx. Pt voiced understanding. Pt will RTC on 07/01/15 for a cath specimen. Medication sent to pt pharmacy.

## 2015-07-01 ENCOUNTER — Ambulatory Visit (INDEPENDENT_AMBULATORY_CARE_PROVIDER_SITE_OTHER): Payer: PPO

## 2015-07-01 DIAGNOSIS — N390 Urinary tract infection, site not specified: Secondary | ICD-10-CM | POA: Diagnosis not present

## 2015-07-01 LAB — MICROSCOPIC EXAMINATION: RBC, UA: NONE SEEN /hpf (ref 0–?)

## 2015-07-01 LAB — URINALYSIS, COMPLETE
BILIRUBIN UA: NEGATIVE
GLUCOSE, UA: NEGATIVE
KETONES UA: NEGATIVE
NITRITE UA: NEGATIVE
Protein, UA: NEGATIVE
RBC UA: NEGATIVE
SPEC GRAV UA: 1.02 (ref 1.005–1.030)
Urobilinogen, Ur: 0.2 mg/dL (ref 0.2–1.0)
pH, UA: 5.5 (ref 5.0–7.5)

## 2015-07-01 NOTE — Progress Notes (Signed)
In and Out Catheterization  Patient is present today for a I & O catheterization due to post abx therapy. Patient was cleaned and prepped in a sterile fashion with betadine and Lidocaine 2% jelly was instilled into the urethra.  A 14FR cath was inserted no complications were noted , 165ml of urine return was noted, urine was clear and yellow in color. A clean urine sample was collected for u/a and cx. Bladder was drained  And catheter was removed with out difficulty.    Preformed by: Toniann Fail, LPN

## 2015-07-03 LAB — CULTURE, URINE COMPREHENSIVE

## 2015-07-04 ENCOUNTER — Telehealth: Payer: Self-pay

## 2015-07-04 NOTE — Telephone Encounter (Signed)
-----   Message from Nori Riis, PA-C sent at 07/03/2015  3:54 PM EST ----- Please tell the patient that her urine culture is negative.  We will see her on the 7th of February.

## 2015-07-04 NOTE — Telephone Encounter (Signed)
Spoke with pt in reference to -ucx. Pt voiced understanding.  

## 2015-07-22 ENCOUNTER — Encounter: Payer: Self-pay | Admitting: Urology

## 2015-07-22 ENCOUNTER — Ambulatory Visit (INDEPENDENT_AMBULATORY_CARE_PROVIDER_SITE_OTHER): Payer: PPO | Admitting: Urology

## 2015-07-22 VITALS — BP 103/67 | HR 130 | Ht 69.0 in | Wt 227.5 lb

## 2015-07-22 DIAGNOSIS — N3941 Urge incontinence: Secondary | ICD-10-CM

## 2015-07-22 DIAGNOSIS — N952 Postmenopausal atrophic vaginitis: Secondary | ICD-10-CM

## 2015-07-22 LAB — URINALYSIS, COMPLETE
BILIRUBIN UA: NEGATIVE
GLUCOSE, UA: NEGATIVE
KETONES UA: NEGATIVE
NITRITE UA: NEGATIVE
PROTEIN UA: NEGATIVE
RBC, UA: NEGATIVE
SPEC GRAV UA: 1.025 (ref 1.005–1.030)
UUROB: 0.2 mg/dL (ref 0.2–1.0)
pH, UA: 5.5 (ref 5.0–7.5)

## 2015-07-22 LAB — MICROSCOPIC EXAMINATION

## 2015-07-22 LAB — BLADDER SCAN AMB NON-IMAGING: SCAN RESULT: 17

## 2015-07-22 MED ORDER — ESTROGENS, CONJUGATED 0.625 MG/GM VA CREA: 1.0000 | TOPICAL_CREAM | Freq: Every day | VAGINAL | Status: DC

## 2015-07-22 MED ORDER — ESTRADIOL 0.1 MG/GM VA CREA: TOPICAL_CREAM | VAGINAL | Status: DC

## 2015-07-22 NOTE — Progress Notes (Signed)
10:35 PM   Danielle Nixon 11/14/1935 [id number]  Referring provider: Madelyn Brunner, MD [facility] [place], [place] [address]  Chief Complaint  Patient presents with  . Urge Incontinence    1 month follow up  . Vaginitis    HPI: Patient is a 80 year old Caucasian female who presents today for a recheck after being placed on vaginal estrogen cream and Myrbetriq.    Background story Patient presented one month ago stating that last she developed the acute onset of dysuria.  The dysuria occurred consistently for three days and she contacted her PCP, Dr. Gilford Rile, and he phoned in tetracycline.  She had been on the antibiotic for three days with minimal improvement in her urinary symptoms.  She has since stopped the antibiotic.  She has not had fevers, chills, nausea or vomiting.  She is not experiencing gross hematuria or suprapubic pain.  Her UA one month ago was nitrate positive, but her culture was negative.    Urge incontinence Patient also has a history of urge incontinence for which she was given Vesicare samples.  She did not recall if the Vesicare samples were effective.  She is still experiencing severe urge incontinence.  She states as soon as her feet hit the floor to use the restroom, she leaks.  This is very frustrating to her.  Her PVR's have been minimal.  She was initiated on Myrbetriq 25 mg daily.  She notes a mild improvement in her urinary urge incontinence.  Her PVR 17 mL on today's exam. Her UA is unremarkable.  She would like to try a higher dose of the medication.  Atrophic vaginitis She has a history of atrophic vaginitis and recurrent UTI's, but she has not been using her vaginal estrogen cream consistently.  She was given samples and encouraged to use that cream more consistently. She is applying it Monday, Wednesday and Friday nights. She is not experiencing vaginal irritation or burning.     PMH: Past Medical History    Diagnosis Date  . Heart disease   . Flank pain   . DM (diabetes mellitus) ([place])   . Urge incontinence   . Atrophic vaginitis   . Vaginal yeast infection   . Over weight   . Hydronephrosis   . Hematuria, microscopic   . Urethral stricture     Surgical History: Past Surgical History  Procedure Laterality Date  . Tonsillectomy    . [place] flap ureteroscopy    . Gallbladder surgery    . Appendectomy    . Foot surgery    . Abdominal hysterectomy      Home Medications:    Medication List       This list is accurate as of: 07/22/15 11:59 PM.  Always use your most recent med list.               amitriptyline 50 MG tablet  Commonly known as:  ELAVIL  TAKE 1 TABLET EVERY DAY     anastrozole 1 MG tablet  Commonly known as:  ARIMIDEX  Take by mouth.     atorvastatin 10 MG tablet  Commonly known as:  LIPITOR  TAKE 1 TABLET ONE TIME DAILY  FOR  CHOLESTEROL     conjugated estrogens vaginal cream  Commonly known as:  PREMARIN  Place 1 Applicatorful vaginally daily. Apply 0.5mg  (pea-sized amount)  just inside the vaginal introitus with a finger-tip every night for two weeks  and then Monday, Wednesday and Friday nights.     estradiol 0.1 MG/GM vaginal cream  Commonly known as:  ESTRACE VAGINAL  Apply 0.5mg  (pea-sized amount)  just inside the vaginal introitus with a finger-tip every night for two weeks and then Monday, Wednesday and Friday nights.     fosfomycin 3 g Pack  Commonly known as:  MONUROL  Take 1 pill every other day x3     gabapentin 300 MG capsule  Commonly known as:  NEURONTIN  TAKE 2 CAPSULES THREE TIMES DAILY     tetracycline 250 MG capsule  Commonly known as:  ACHROMYCIN,SUMYCIN  Reported on 07/22/2015     valsartan-hydrochlorothiazide 80-12.5 MG tablet  Commonly known as:  DIOVAN-HCT  TAKE 1 TABLET EVERY DAY        Allergies:  Allergies  Allergen Reactions  . Ciprofloxacin Nausea And Vomiting  . Doxycycline Other (See Comments)  .  Nitrofurantoin Monohyd Macro Other (See Comments)  . Nsaids Other (See Comments)  . Penicillins Other (See Comments)    Has taken recently without problems  . Pseudoephedrine Hcl Other (See Comments)  . Sulfa Antibiotics   . Terazosin Other (See Comments)    Family History: Family History  Problem Relation Age of Onset  . Diabetes    . Breast cancer    . Heart disease    . Kidney disease Neg Hx   . Bladder Cancer Neg Hx     Social History:  reports that she has never smoked. She does not have any smokeless tobacco history on file. She reports that she does not drink alcohol or use illicit drugs.  ROS: UROLOGY Frequent Urination?: No Hard to postpone urination?: Yes Burning/pain with urination?: No Get up at night to urinate?: No Leakage of urine?: Yes Urine stream starts and stops?: No Trouble starting stream?: No Do you have to strain to urinate?: No Blood in urine?: No Urinary tract infection?: No Sexually transmitted disease?: No Injury to kidneys or bladder?: No Painful intercourse?: No Weak stream?: No Currently pregnant?: No Vaginal bleeding?: No Last menstrual period?: n  Gastrointestinal Nausea?: No Vomiting?: No Indigestion/heartburn?: No Diarrhea?: No Constipation?: No  Constitutional Fever: No Night sweats?: No Weight loss?: No Fatigue?: Yes  Skin Skin rash/lesions?: No Itching?: No  Eyes Blurred vision?: No Double vision?: No  Ears/Nose/Throat Sore throat?: No Sinus problems?: No  Hematologic/Lymphatic Swollen glands?: No Easy bruising?: No  Cardiovascular Leg swelling?: No Chest pain?: No  Respiratory Cough?: Yes Shortness of breath?: No  Endocrine Excessive thirst?: No  Musculoskeletal Back pain?: No Joint pain?: No  Neurological Headaches?: No Dizziness?: No  Psychologic Depression?: No Anxiety?: No  Physical Exam: BP 103/67 mmHg  Pulse 130  Ht 5\' 9"  (1.753 m)  Wt 227 lb 8 oz (103.193 kg)  BMI 33.58 kg/m2   Constitutional: Well nourished. Alert and oriented, No acute distress. HEENT: [place] AT, moist mucus membranes. Trachea midline, no masses. Cardiovascular: No clubbing, cyanosis, or edema. Respiratory: Normal respiratory effort, no increased work of breathing. GI: Abdomen is soft, non tender, non distended, no abdominal masses. Liver and spleen not palpable.  No hernias appreciated.  Stool sample for occult testing is not indicated.   GU: No CVA tenderness.  No bladder fullness or masses.  Skin: No rashes, bruises or suspicious lesions. Lymph: No cervical or inguinal adenopathy. Neurologic: Grossly intact, no focal deficits, moving all 4 extremities. Psychiatric: Normal mood and affect.  Laboratory Data: Lab Results  Component Value Date   WBC 6.1  03/14/2015   HGB 12.5 03/14/2015   HCT 36.3 03/14/2015   MCV 92.6 03/14/2015   PLT 179 03/14/2015    Lab Results  Component Value Date   CREATININE 1.27* 03/14/2015    Urinalysis Results for orders placed or performed in visit on 07/22/15  Microscopic Examination  Result Value Ref Range   WBC, UA 11-30 (A) 0 -  5 /hpf   RBC, UA 0-2 0 -  2 /hpf   Epithelial Cells (non renal) 0-10 0 - 10 /hpf   Renal Epithel, UA 0-10 (A) None seen /hpf   Casts Present (A) None seen /lpf   Cast Type Hyaline casts N/A   Bacteria, UA Few None seen/Few  Urinalysis, Complete  Result Value Ref Range   Specific Gravity, UA 1.025 1.005 - 1.030   pH, UA 5.5 5.0 - 7.5   Color, UA Yellow Yellow   Appearance Ur Clear Clear   Leukocytes, UA 1+ (A) Negative   Protein, UA Negative Negative/Trace   Glucose, UA Negative Negative   Ketones, UA Negative Negative   RBC, UA Negative Negative   Bilirubin, UA Negative Negative   Urobilinogen, Ur 0.2 0.2 - 1.0 mg/dL   Nitrite, UA Negative Negative   Microscopic Examination See below:     Pertinent Imaging: Results for CECIL, AUGUST (MRN [id number]) as of 07/27/2015 22:31  Ref. Range 07/22/2015 13:47  Scan  Result Unknown 17     Assessment & Plan:    1. Urge incontinence:   Patient's PVR's have been minimal.  She found mild improvement with Myrbetriq 25 mg daily.    She will now try Myrbetriq 50 mg daily.  Samples are given to the patient.  She will follow up in one month for PVR and symptom recheck.    - Urinalysis, Complete - BLADDER SCAN AMB NON-IMAGING  2. Atrophic vaginitis:   Patient is using her vaginal estrogen cream consistently, applying it Monday nights, Wednesday nights and Friday nights.  She will continue the vaginal cream.  A prescription for the Premarin cream is sent to her pharmacy.  Return in about 1 month (around 08/19/2015) for PVR .  Zara Council, [facility] [address], [place],  [address] [phone]

## 2015-08-08 ENCOUNTER — Ambulatory Visit: Payer: Self-pay | Admitting: Urology

## 2015-08-27 ENCOUNTER — Encounter: Payer: Self-pay | Admitting: Urology

## 2015-08-27 ENCOUNTER — Ambulatory Visit (INDEPENDENT_AMBULATORY_CARE_PROVIDER_SITE_OTHER): Payer: PPO | Admitting: Urology

## 2015-08-27 VITALS — BP 131/79 | HR 121 | Ht 69.0 in | Wt 225.3 lb

## 2015-08-27 DIAGNOSIS — N3941 Urge incontinence: Secondary | ICD-10-CM | POA: Diagnosis not present

## 2015-08-27 DIAGNOSIS — N952 Postmenopausal atrophic vaginitis: Secondary | ICD-10-CM

## 2015-08-27 LAB — MICROSCOPIC EXAMINATION: Bacteria, UA: NONE SEEN

## 2015-08-27 LAB — URINALYSIS, COMPLETE
BILIRUBIN UA: NEGATIVE
KETONES UA: NEGATIVE
NITRITE UA: NEGATIVE
Protein, UA: NEGATIVE
RBC UA: NEGATIVE
SPEC GRAV UA: 1.02 (ref 1.005–1.030)
Urobilinogen, Ur: 0.2 mg/dL (ref 0.2–1.0)
pH, UA: 5.5 (ref 5.0–7.5)

## 2015-08-27 LAB — BLADDER SCAN AMB NON-IMAGING: SCAN RESULT: 42

## 2015-08-27 MED ORDER — MIRABEGRON ER 25 MG PO TB24: 25.0000 mg | ORAL_TABLET | Freq: Every day | ORAL | Status: DC

## 2015-08-27 NOTE — Progress Notes (Signed)
2:03 PM   Danielle Nixon 04/11/1936 [id number]  Referring provider: Madelyn Brunner, MD [place] [facility] [place], [place] [address]  Chief Complaint  Patient presents with  . Follow-up    HPI: Patient is a 80 year old Caucasian female who presents today for a recheck after being placed on Myrbetriq 50 mg daily.    Urge incontinence Patient also has a history of urge incontinence for which she was given Vesicare samples.  She did not recall if the Vesicare samples were effective.  She is still experiencing severe urge incontinence.  She states as soon as her feet hit the floor to use the restroom, she leaks.  This is very frustrating to her.  Her PVR's have been minimal.  She was initiated on Myrbetriq 25 mg daily.  She notes a mild improvement in her urinary urge incontinence.   She was then increased to Myrbetriq 50 mg daily and has seen remarkable improvements in her symptoms.   Her PVR today is 42 mL on today's exam.   Her UA today is unremarkable.  PMH: Past Medical History  Diagnosis Date  . Heart disease   . Flank pain   . DM (diabetes mellitus) ([place])   . Urge incontinence   . Atrophic vaginitis   . Vaginal yeast infection   . Over weight   . Hydronephrosis   . Hematuria, microscopic   . Urethral stricture     Surgical History: Past Surgical History  Procedure Laterality Date  . Tonsillectomy    . [place] flap ureteroscopy    . Gallbladder surgery    . Appendectomy    . Foot surgery    . Abdominal hysterectomy      Home Medications:    Medication List       This list is accurate as of: 08/27/15  2:03 PM.  Always use your most recent med list.               amitriptyline 50 MG tablet  Commonly known as:  ELAVIL  TAKE 1 TABLET EVERY DAY     anastrozole 1 MG tablet  Commonly known as:  ARIMIDEX  Take by mouth.     atorvastatin 10 MG tablet  Commonly known as:  LIPITOR  TAKE 1 TABLET ONE TIME DAILY  FOR   CHOLESTEROL     conjugated estrogens vaginal cream  Commonly known as:  PREMARIN  Place 1 Applicatorful vaginally daily. Apply 0.5mg  (pea-sized amount)  just inside the vaginal introitus with a finger-tip every night for two weeks and then Monday, Wednesday and Friday nights.     estradiol 0.1 MG/GM vaginal cream  Commonly known as:  ESTRACE VAGINAL  Apply 0.5mg  (pea-sized amount)  just inside the vaginal introitus with a finger-tip every night for two weeks and then Monday, Wednesday and Friday nights.     fosfomycin 3 g Pack  Commonly known as:  MONUROL  Take 1 pill every other day x3     gabapentin 300 MG capsule  Commonly known as:  NEURONTIN  TAKE 2 CAPSULES THREE TIMES DAILY     mirabegron ER 25 MG Tb24 tablet  Commonly known as:  MYRBETRIQ  Take 1 tablet (25 mg total) by mouth daily.     tetracycline 250 MG capsule  Commonly known as:  ACHROMYCIN,SUMYCIN  Reported on 07/22/2015     valsartan-hydrochlorothiazide 80-12.5 MG tablet  Commonly known as:  DIOVAN-HCT  TAKE 1 TABLET  EVERY DAY        Allergies:  Allergies  Allergen Reactions  . Ciprofloxacin Nausea And Vomiting  . Doxycycline Other (See Comments)  . Nitrofurantoin Monohyd Macro Other (See Comments)  . Nsaids Other (See Comments)  . Penicillins Other (See Comments)    Has taken recently without problems  . Pseudoephedrine Hcl Other (See Comments)  . Sulfa Antibiotics   . Terazosin Other (See Comments)    Family History: Family History  Problem Relation Age of Onset  . Diabetes    . Breast cancer    . Heart disease    . Kidney disease Neg Hx   . Bladder Cancer Neg Hx     Social History:  reports that she has never smoked. She does not have any smokeless tobacco history on file. She reports that she does not drink alcohol or use illicit drugs.  ROS: UROLOGY Frequent Urination?: No Hard to postpone urination?: Yes Burning/pain with urination?: No Get up at night to urinate?: No Leakage of  urine?: Yes Urine stream starts and stops?: No Trouble starting stream?: No Do you have to strain to urinate?: No Blood in urine?: No Urinary tract infection?: No Sexually transmitted disease?: No Injury to kidneys or bladder?: No Painful intercourse?: No Weak stream?: No Currently pregnant?: No Vaginal bleeding?: No Last menstrual period?: n  Gastrointestinal Nausea?: No Vomiting?: No Indigestion/heartburn?: No Diarrhea?: No Constipation?: No  Constitutional Fever: No Night sweats?: No Weight loss?: No Fatigue?: No  Skin Skin rash/lesions?: No Itching?: No  Eyes Blurred vision?: No Double vision?: No  Ears/Nose/Throat Sore throat?: No Sinus problems?: No  Hematologic/Lymphatic Swollen glands?: No Easy bruising?: No  Cardiovascular Leg swelling?: No Chest pain?: No  Respiratory Cough?: No Shortness of breath?: No  Endocrine Excessive thirst?: No  Musculoskeletal Back pain?: No Joint pain?: No  Neurological Headaches?: No Dizziness?: No  Psychologic Depression?: No Anxiety?: No  Physical Exam: BP 131/79 mmHg  Pulse 121  Ht 5\' 9"  (1.753 m)  Wt 225 lb 4.8 oz (102.195 kg)  BMI 33.26 kg/m2  Constitutional: Well nourished. Alert and oriented, No acute distress. HEENT: [place] AT, moist mucus membranes. Trachea midline, no masses. Cardiovascular: No clubbing, cyanosis, or edema. Respiratory: Normal respiratory effort, no increased work of breathing. GI: Abdomen is soft, non tender, non distended, no abdominal masses. Liver and spleen not palpable.  No hernias appreciated.  Stool sample for occult testing is not indicated.   GU: No CVA tenderness.  No bladder fullness or masses.  Skin: No rashes, bruises or suspicious lesions. Lymph: No cervical or inguinal adenopathy. Neurologic: Grossly intact, no focal deficits, moving all 4 extremities. Psychiatric: Normal mood and affect.  Laboratory Data: Lab Results  Component Value Date   WBC 6.1  03/14/2015   HGB 12.5 03/14/2015   HCT 36.3 03/14/2015   MCV 92.6 03/14/2015   PLT 179 03/14/2015    Lab Results  Component Value Date   CREATININE 1.27* 03/14/2015    Urinalysis Results for orders placed or performed in visit on 08/27/15  Microscopic Examination  Result Value Ref Range   WBC, UA 6-10 (A) 0 -  5 /hpf   RBC, UA 0-2 0 -  2 /hpf   Epithelial Cells (non renal) 0-10 0 - 10 /hpf   Renal Epithel, UA 0-10 (A) None seen /hpf   Bacteria, UA None seen None seen/Few  Urinalysis, Complete  Result Value Ref Range   Specific Gravity, UA 1.020 1.005 - 1.030   pH, UA  5.5 5.0 - 7.5   Color, UA Yellow Yellow   Appearance Ur Clear Clear   Leukocytes, UA Trace (A) Negative   Protein, UA Negative Negative/Trace   Glucose, UA Trace (A) Negative   Ketones, UA Negative Negative   RBC, UA Negative Negative   Bilirubin, UA Negative Negative   Urobilinogen, Ur 0.2 0.2 - 1.0 mg/dL   Nitrite, UA Negative Negative   Microscopic Examination See below:   BLADDER SCAN AMB NON-IMAGING  Result Value Ref Range   Scan Result 42     Pertinent Imaging: Results for LATIFFANY, PIETRUCHA (MRN [id number]) as of 08/27/2015 14:12  Ref. Range 08/27/2015 13:58  Scan Result Unknown 42    Assessment & Plan:    1. Urge incontinence:   Patient's PVR's have been minimal.  She found mild improvement with Myrbetriq 25 mg daily and greater improvement on Myrbetriq 50 mg daily.  Samples are given to the patient.  Prescription is sent to her pharmacy.   She will follow up in one year for PVR and symptom recheck.    - Urinalysis, Complete - BLADDER SCAN AMB NON-IMAGING  2. Atrophic vaginitis:   Patient is using her vaginal estrogen cream consistently, applying it Monday nights, Wednesday nights and Friday nights.  She will continue the vaginal cream.  She will follow up in one year for an exam.    Return in about 1 year (around 08/26/2016) for PVR and exam.  Zara Council, [facility]  [place] [address] [place], [place] [address] [phone]

## 2015-11-20 DIAGNOSIS — R739 Hyperglycemia, unspecified: Secondary | ICD-10-CM | POA: Diagnosis not present

## 2015-11-27 DIAGNOSIS — E7800 Pure hypercholesterolemia, unspecified: Secondary | ICD-10-CM | POA: Diagnosis not present

## 2015-11-27 DIAGNOSIS — D649 Anemia, unspecified: Secondary | ICD-10-CM | POA: Diagnosis not present

## 2015-11-27 DIAGNOSIS — I10 Essential (primary) hypertension: Secondary | ICD-10-CM | POA: Diagnosis not present

## 2015-11-27 DIAGNOSIS — Z853 Personal history of malignant neoplasm of breast: Secondary | ICD-10-CM | POA: Diagnosis not present

## 2015-11-27 DIAGNOSIS — N183 Chronic kidney disease, stage 3 (moderate): Secondary | ICD-10-CM | POA: Diagnosis not present

## 2015-12-01 ENCOUNTER — Other Ambulatory Visit: Payer: Self-pay | Admitting: Internal Medicine

## 2015-12-01 DIAGNOSIS — Z853 Personal history of malignant neoplasm of breast: Secondary | ICD-10-CM

## 2015-12-25 ENCOUNTER — Ambulatory Visit
Admission: RE | Admit: 2015-12-25 | Discharge: 2015-12-25 | Disposition: A | Discharge status: Home / Self Care | Payer: PPO | Source: Ambulatory Visit | Attending: Internal Medicine | Admitting: Internal Medicine

## 2015-12-25 DIAGNOSIS — Z853 Personal history of malignant neoplasm of breast: Secondary | ICD-10-CM

## 2015-12-25 DIAGNOSIS — R922 Inconclusive mammogram: Secondary | ICD-10-CM | POA: Diagnosis not present

## 2015-12-25 DIAGNOSIS — Z1231 Encounter for screening mammogram for malignant neoplasm of breast: Secondary | ICD-10-CM | POA: Insufficient documentation

## 2015-12-25 HISTORY — DX: Malignant neoplasm of unspecified site of unspecified female breast: C50.919

## 2016-01-07 DIAGNOSIS — K648 Other hemorrhoids: Secondary | ICD-10-CM | POA: Diagnosis not present

## 2016-01-07 DIAGNOSIS — K219 Gastro-esophageal reflux disease without esophagitis: Secondary | ICD-10-CM | POA: Diagnosis not present

## 2016-01-16 DIAGNOSIS — K648 Other hemorrhoids: Secondary | ICD-10-CM | POA: Diagnosis not present

## 2016-03-08 ENCOUNTER — Other Ambulatory Visit: Payer: Self-pay

## 2016-03-08 DIAGNOSIS — Z853 Personal history of malignant neoplasm of breast: Secondary | ICD-10-CM

## 2016-03-12 ENCOUNTER — Inpatient Hospital Stay: Payer: PPO

## 2016-03-12 ENCOUNTER — Inpatient Hospital Stay: Payer: PPO | Attending: Internal Medicine | Admitting: Internal Medicine

## 2016-03-12 ENCOUNTER — Encounter: Payer: Self-pay | Admitting: Internal Medicine

## 2016-03-12 DIAGNOSIS — C50811 Malignant neoplasm of overlapping sites of right female breast: Secondary | ICD-10-CM

## 2016-03-12 DIAGNOSIS — Z170 Estrogen receptor positive status [ER+]: Secondary | ICD-10-CM | POA: Diagnosis not present

## 2016-03-12 DIAGNOSIS — Z9223 Personal history of estrogen therapy: Secondary | ICD-10-CM | POA: Insufficient documentation

## 2016-03-12 DIAGNOSIS — N183 Chronic kidney disease, stage 3 (moderate): Secondary | ICD-10-CM | POA: Insufficient documentation

## 2016-03-12 DIAGNOSIS — E1122 Type 2 diabetes mellitus with diabetic chronic kidney disease: Secondary | ICD-10-CM | POA: Diagnosis not present

## 2016-03-12 DIAGNOSIS — Z923 Personal history of irradiation: Secondary | ICD-10-CM | POA: Insufficient documentation

## 2016-03-12 DIAGNOSIS — Z79899 Other long term (current) drug therapy: Secondary | ICD-10-CM | POA: Insufficient documentation

## 2016-03-12 DIAGNOSIS — Z853 Personal history of malignant neoplasm of breast: Secondary | ICD-10-CM

## 2016-03-12 LAB — CBC WITH DIFFERENTIAL/PLATELET
Basophils Absolute: 0 10*3/uL (ref 0–0.1)
Basophils Relative: 1 %
EOS ABS: 0.3 10*3/uL (ref 0–0.7)
EOS PCT: 5 %
HCT: 35.5 % (ref 35.0–47.0)
HEMOGLOBIN: 12.4 g/dL (ref 12.0–16.0)
Lymphocytes Relative: 33 %
Lymphs Abs: 2.1 10*3/uL (ref 1.0–3.6)
MCH: 32.4 pg (ref 26.0–34.0)
MCHC: 34.9 g/dL (ref 32.0–36.0)
MCV: 92.9 fL (ref 80.0–100.0)
MONOS PCT: 6 %
Monocytes Absolute: 0.4 10*3/uL (ref 0.2–0.9)
NEUTROS ABS: 3.5 10*3/uL (ref 1.4–6.5)
NEUTROS PCT: 55 %
Platelets: 170 10*3/uL (ref 150–440)
RBC: 3.82 MIL/uL (ref 3.80–5.20)
RDW: 14.8 % — ABNORMAL HIGH (ref 11.5–14.5)
WBC: 6.4 10*3/uL (ref 3.6–11.0)

## 2016-03-12 LAB — COMPREHENSIVE METABOLIC PANEL
ALK PHOS: 74 U/L (ref 38–126)
ALT: 22 U/L (ref 14–54)
ANION GAP: 7 (ref 5–15)
AST: 33 U/L (ref 15–41)
Albumin: 4.2 g/dL (ref 3.5–5.0)
BUN: 34 mg/dL — ABNORMAL HIGH (ref 6–20)
CALCIUM: 9.6 mg/dL (ref 8.9–10.3)
CO2: 29 mmol/L (ref 22–32)
CREATININE: 1.28 mg/dL — AB (ref 0.44–1.00)
Chloride: 103 mmol/L (ref 101–111)
GFR, EST AFRICAN AMERICAN: 45 mL/min — AB (ref >60)
GFR, EST NON AFRICAN AMERICAN: 39 mL/min — AB (ref >60)
Glucose, Bld: 160 mg/dL — ABNORMAL HIGH (ref 65–99)
Potassium: 3.7 mmol/L (ref 3.5–5.1)
SODIUM: 139 mmol/L (ref 135–145)
TOTAL PROTEIN: 7.5 g/dL (ref 6.5–8.1)
Total Bilirubin: 0.6 mg/dL (ref 0.3–1.2)

## 2016-03-12 NOTE — Progress Notes (Signed)
Pt reports neuropathy in hands and feet.  Mammogram was 2-3 months ago with no concerns.

## 2016-03-12 NOTE — Progress Notes (Signed)
[place] OFFICE PROGRESS NOTE  Patient Care Team: Madelyn Brunner, MD as PCP - General (Internal Medicine)  No matching staging information was found for the patient.   Oncology History   # OCT 2011- pT2 pN0 (sn) cM0 invasive ductal carcinoma of the right breast status post lumpectomy and sentinel node study on April 07, 2010. Tumor size 2.7 cm, margins negative, grade 1.  Two sentinel lymph nodes negative. ER and PR positive. HER-2/neu negative (2+ on IHC, FISH negative, low Her2/CEP17 ratio of 1.15). Oncotype DX test on 04/22/10 showed low-risk recurrence score of 11 (average rate of distant recurrence of 7%).; Started aromatase inhibitor in Feb 2012; STOPPED FEB 2017.      Malignant neoplasm of overlapping sites of right breast [facility])     This is my first interaction with the patient as patient's primary oncologist has been [place]. I reviewed the patient's prior charts/pertinent labs/imaging in detail; findings are summarized above.     INTERVAL HISTORY:  Danielle Nixon 80 y.o.  female pleasant patient above history of Stage II breast cancer low-risk of recurrence on Oncotype/ is here for follow-up. She come off her anastrozole January 2017.  She denies any new symptoms. No nausea no vomiting. No headaches or vision changes. No body pain bone pain.   REVIEW OF SYSTEMS:  A complete 10 point review of system is done which is negative except mentioned above/history of present illness.   PAST MEDICAL HISTORY :  Past Medical History:  Diagnosis Date  . Atrophic vaginitis   . Breast cancer ([place]) 2011   RT LUMPECTOMY W/RADIATION  . DM (diabetes mellitus) ([place])   . Flank pain   . Heart disease   . Hematuria, microscopic   . Hydronephrosis   . Over weight   . Urethral stricture   . Urge incontinence   . Vaginal yeast infection     PAST SURGICAL HISTORY :   Past Surgical History:  Procedure Laterality Date  . ABDOMINAL HYSTERECTOMY    .  APPENDECTOMY    . [place] flap ureteroscopy    . FOOT SURGERY    . GALLBLADDER SURGERY    . TONSILLECTOMY      FAMILY HISTORY :   Family History  Problem Relation Age of Onset  . Diabetes    . Breast cancer    . Heart disease    . Kidney disease Neg Hx   . Bladder Cancer Neg Hx     SOCIAL HISTORY:   Social History  Substance Use Topics  . Smoking status: Never Smoker  . Smokeless tobacco: Never Used  . Alcohol use No    ALLERGIES:  is allergic to ciprofloxacin; doxycycline; nitrofurantoin monohyd macro; nsaids; penicillins; pseudoephedrine hcl; sulfa antibiotics; and terazosin.  MEDICATIONS:  Current Outpatient Prescriptions  Medication Sig Dispense Refill  . amitriptyline (ELAVIL) 50 MG tablet TAKE 1 TABLET EVERY DAY    . atorvastatin (LIPITOR) 10 MG tablet TAKE 1 TABLET ONE TIME DAILY  FOR  CHOLESTEROL    . conjugated estrogens (PREMARIN) vaginal cream Place 1 Applicatorful vaginally daily. Apply 0.42m (pea-sized amount)  just inside the vaginal introitus with a finger-tip every night for two weeks and then Monday, Wednesday and Friday nights. 30 g 12  . estradiol (ESTRACE VAGINAL) 0.1 MG/GM vaginal cream Apply 0.554m(pea-sized amount)  just inside the vaginal introitus with a finger-tip every night for two weeks and then Monday, Wednesday and Friday nights. 30 g 12  . gabapentin (NEURONTIN)  300 MG capsule TAKE 2 CAPSULES THREE TIMES DAILY    . mirabegron ER (MYRBETRIQ) 25 MG TB24 tablet Take 1 tablet (25 mg total) by mouth daily. 30 tablet 12  . valsartan-hydrochlorothiazide (DIOVAN-HCT) 80-12.5 MG tablet TAKE 1 TABLET EVERY DAY     No current facility-administered medications for this visit.     PHYSICAL EXAMINATION: ECOG PERFORMANCE STATUS: 0 - Asymptomatic  BP 118/78 (BP Location: Left Arm, Patient Position: Sitting)   Pulse (!) 103   Temp (!) 96.7 F (35.9 C) (Tympanic)   Resp 17   Ht '5\' 9"'  (1.753 m)   Wt 227 lb 11.2 oz (103.3 kg)   BMI 33.63 kg/m   Filed  Weights   03/12/16 1509  Weight: 227 lb 11.2 oz (103.3 kg)    GENERAL: Well-nourished well-developed; Alert, no distress and comfortable.   Alone. Walks with a cane.  EYES: no pallor or icterus OROPHARYNX: no thrush or ulceration; good dentition  NECK: supple, no masses felt LYMPH:  no palpable lymphadenopathy in the cervical, axillary or inguinal regions LUNGS: clear to auscultation and  No wheeze or crackles HEART/CVS: regular rate & rhythm and no murmurs; No lower extremity edema ABDOMEN:abdomen soft, non-tender and normal bowel sounds Musculoskeletal:no cyanosis of digits and no clubbing  PSYCH: alert & oriented x 3 with fluent speech NEURO: no focal motor/sensory deficits SKIN:  no rashes or significant lesions  LABORATORY DATA:  I have reviewed the data as listed    Component Value Date/Time   NA 139 03/12/2016 1441   NA 141 11/21/2012 1417   K 3.7 03/12/2016 1441   K 4.3 03/08/2013 1154   CL 103 03/12/2016 1441   CL 106 11/21/2012 1417   CO2 29 03/12/2016 1441   CO2 29 11/21/2012 1417   GLUCOSE 160 (H) 03/12/2016 1441   GLUCOSE 135 (H) 11/21/2012 1417   BUN 34 (H) 03/12/2016 1441   BUN 30 (H) 11/21/2012 1417   CREATININE 1.28 (H) 03/12/2016 1441   CREATININE 1.44 (H) 03/06/2014 1415   CALCIUM 9.6 03/12/2016 1441   CALCIUM 9.4 11/21/2012 1417   PROT 7.5 03/12/2016 1441   PROT 7.1 03/06/2014 1415   ALBUMIN 4.2 03/12/2016 1441   ALBUMIN 3.5 03/06/2014 1415   AST 33 03/12/2016 1441   AST 35 03/06/2014 1415   ALT 22 03/12/2016 1441   ALT 45 03/06/2014 1415   ALKPHOS 74 03/12/2016 1441   ALKPHOS 89 03/06/2014 1415   BILITOT 0.6 03/12/2016 1441   BILITOT 0.4 03/06/2014 1415   GFRNONAA 39 (L) 03/12/2016 1441   GFRNONAA 38 (L) 03/06/2014 1415   GFRNONAA 34 (L) 02/26/2013 1404   GFRAA 45 (L) 03/12/2016 1441   GFRAA 46 (L) 03/06/2014 1415   GFRAA 39 (L) 02/26/2013 1404    No results found for: SPEP, UPEP  Lab Results  Component Value Date   WBC 6.4 03/12/2016    NEUTROABS 3.5 03/12/2016   HGB 12.4 03/12/2016   HCT 35.5 03/12/2016   MCV 92.9 03/12/2016   PLT 170 03/12/2016      Chemistry      Component Value Date/Time   NA 139 03/12/2016 1441   NA 141 11/21/2012 1417   K 3.7 03/12/2016 1441   K 4.3 03/08/2013 1154   CL 103 03/12/2016 1441   CL 106 11/21/2012 1417   CO2 29 03/12/2016 1441   CO2 29 11/21/2012 1417   BUN 34 (H) 03/12/2016 1441   BUN 30 (H) 11/21/2012 1417   CREATININE 1.28 (  H) 03/12/2016 1441   CREATININE 1.44 (H) 03/06/2014 1415      Component Value Date/Time   CALCIUM 9.6 03/12/2016 1441   CALCIUM 9.4 11/21/2012 1417   ALKPHOS 74 03/12/2016 1441   ALKPHOS 89 03/06/2014 1415   AST 33 03/12/2016 1441   AST 35 03/06/2014 1415   ALT 22 03/12/2016 1441   ALT 45 03/06/2014 1415   BILITOT 0.6 03/12/2016 1441   BILITOT 0.4 03/06/2014 1415       RADIOGRAPHIC STUDIES: I have personally reviewed the radiological images as listed and agreed with the findings in the report. No results found.   ASSESSMENT & PLAN:  Malignant neoplasm of overlapping sites of right breast ([place]) # Right breast cancer stage II- ER/PR pos s/p Lumpec & RT. mammo-NEG; clinically- no evidence of disease. Mammogram July 2017 negative.  # CKD- Stage III/stable.  # Recommend follow-up with PCP regarding bone density test. She had an appointment in December 2017.  #  Follow up in 12 months/ cbc/ cmp/mammogram in July 2018.    Orders Placed This Encounter  Procedures  . MM Digital Diagnostic Bilat    Standing Status:   Future    Standing Expiration Date:   03/12/2017    Order Specific Question:   Reason for Exam (SYMPTOM  OR DIAGNOSIS REQUIRED)    Answer:   right breast cancer    Order Specific Question:   Preferred imaging location?    Answer:   [facility]  . US Breast Complete [place]    Standing Status:   Future    Standing Expiration Date:   05/12/2017    Order Specific Question:   Reason for Exam (SYMPTOM  OR  DIAGNOSIS REQUIRED)    Answer:   right breast cancer    Order Specific Question:   Preferred imaging location?    Answer:   [facility]  . US Breast Complete [facility] [place]    Standing Status:   Future    Standing Expiration Date:   05/12/2017    Order Specific Question:   Reason for Exam (SYMPTOM  OR DIAGNOSIS REQUIRED)    Answer:   right breast cancer    Order Specific Question:   Preferred imaging location?    Answer:   [facility]  . CBC with Differential    Standing Status:   Future    Standing Expiration Date:   03/12/2017  . Comprehensive metabolic panel    Standing Status:   Future    Standing Expiration Date:   03/12/2017   All questions were answered. The patient knows to call the clinic with any problems, questions or concerns.      Cammie Sickle, MD 03/12/2016 6:10 PM

## 2016-03-12 NOTE — Assessment & Plan Note (Addendum)
#   Right breast cancer stage II- ER/PR pos s/p Lumpec & RT. mammo-NEG; clinically- no evidence of disease. Mammogram July 2017 negative.  # CKD- Stage III/stable.  # Recommend follow-up with PCP regarding bone density test. She had an appointment in December 2017.  #  Follow up in 12 months/ cbc/ cmp/mammogram in July 2018.

## 2016-03-18 DIAGNOSIS — K648 Other hemorrhoids: Secondary | ICD-10-CM | POA: Diagnosis not present

## 2016-04-14 DIAGNOSIS — K648 Other hemorrhoids: Secondary | ICD-10-CM | POA: Diagnosis not present

## 2016-05-17 DIAGNOSIS — K648 Other hemorrhoids: Secondary | ICD-10-CM | POA: Diagnosis not present

## 2016-05-24 DIAGNOSIS — N183 Chronic kidney disease, stage 3 (moderate): Secondary | ICD-10-CM | POA: Diagnosis not present

## 2016-05-26 DIAGNOSIS — I10 Essential (primary) hypertension: Secondary | ICD-10-CM | POA: Diagnosis not present

## 2016-05-26 DIAGNOSIS — G609 Hereditary and idiopathic neuropathy, unspecified: Secondary | ICD-10-CM | POA: Diagnosis not present

## 2016-05-26 DIAGNOSIS — E7800 Pure hypercholesterolemia, unspecified: Secondary | ICD-10-CM | POA: Diagnosis not present

## 2016-08-16 DIAGNOSIS — K648 Other hemorrhoids: Secondary | ICD-10-CM | POA: Diagnosis not present

## 2016-08-20 DIAGNOSIS — I10 Essential (primary) hypertension: Secondary | ICD-10-CM | POA: Diagnosis not present

## 2016-08-26 ENCOUNTER — Ambulatory Visit: Payer: PPO | Admitting: Urology

## 2016-08-26 DIAGNOSIS — N183 Chronic kidney disease, stage 3 (moderate): Secondary | ICD-10-CM | POA: Diagnosis not present

## 2016-08-26 DIAGNOSIS — E119 Type 2 diabetes mellitus without complications: Secondary | ICD-10-CM | POA: Diagnosis not present

## 2016-08-26 DIAGNOSIS — G609 Hereditary and idiopathic neuropathy, unspecified: Secondary | ICD-10-CM | POA: Diagnosis not present

## 2016-08-26 DIAGNOSIS — I10 Essential (primary) hypertension: Secondary | ICD-10-CM | POA: Diagnosis not present

## 2016-10-06 ENCOUNTER — Inpatient Hospital Stay: Payer: PPO | Attending: Internal Medicine | Admitting: Internal Medicine

## 2016-10-06 DIAGNOSIS — Z79899 Other long term (current) drug therapy: Secondary | ICD-10-CM | POA: Insufficient documentation

## 2016-10-06 DIAGNOSIS — Z853 Personal history of malignant neoplasm of breast: Secondary | ICD-10-CM | POA: Diagnosis not present

## 2016-10-06 DIAGNOSIS — E1122 Type 2 diabetes mellitus with diabetic chronic kidney disease: Secondary | ICD-10-CM | POA: Diagnosis not present

## 2016-10-06 DIAGNOSIS — N183 Chronic kidney disease, stage 3 (moderate): Secondary | ICD-10-CM

## 2016-10-06 DIAGNOSIS — Z170 Estrogen receptor positive status [ER+]: Secondary | ICD-10-CM | POA: Insufficient documentation

## 2016-10-06 DIAGNOSIS — Z9223 Personal history of estrogen therapy: Secondary | ICD-10-CM | POA: Diagnosis not present

## 2016-10-06 DIAGNOSIS — C50811 Malignant neoplasm of overlapping sites of right female breast: Secondary | ICD-10-CM

## 2016-10-06 NOTE — Assessment & Plan Note (Addendum)
#   Right breast cancer stage II- ER/PR pos s/p Lumpec & RT. July 2017 mammo-NEG; clinically- no evidence of disease [C discussion below]  # Intermittent hypercalcemia- up to 11- almost going back to 2015. Reviewed recent workup with PCP PTH normal; M protein normal. Given the chronicity of the hypercalcemia/ without any significant worsening over the last many years- I suspect patient has some benign cause of hypercalcemia. Question hydrochlorothiazide versus others. Recommend evaluation with nephrology versus endocrinology- per PCP discretion.  # CKD- Stage III/stable- 1.2 creatinine.  #  Follow up in 12 months/ cbc/ cmp/mammogram in July 2018 [Dr.Walker]; I discussed the above plan with the patient and husband in detail. They agree.

## 2016-10-06 NOTE — Progress Notes (Signed)
[place] OFFICE PROGRESS NOTE  Patient Care Team: Madelyn Brunner, MD as PCP - General (Internal Medicine)  Cancer Staging No matching staging information was found for the patient.   Oncology History   # OCT 2011- pT2 pN0 (sn) cM0 invasive ductal carcinoma of the right breast status post lumpectomy and sentinel node study on April 07, 2010. Tumor size 2.7 cm, margins negative, grade 1.  Two sentinel lymph nodes negative. ER and PR positive. HER-2/neu negative (2+ on IHC, FISH negative, low Her2/CEP17 ratio of 1.15). Oncotype DX test on 04/22/10 showed low-risk recurrence score of 11 (average rate of distant recurrence of 7%).; Started aromatase inhibitor in Feb 2012; STOPPED FEB 2017.      Carcinoma of overlapping sites of right breast in female, estrogen receptor positive ([place])      INTERVAL HISTORY:  Danielle Nixon 81 y.o.  female pleasant patient above history of Stage II breast cancer low-risk of recurrence on Oncotype/ is here for follow-up. She come off her anastrozole January 2017.   Patient has been referred to [place]- regarding intermittent hypercalcemia calcium 11- concerns for paraneoplastic syndrome.  She denies any new symptoms. No nausea no vomiting. No headaches or vision changes. No body pain bone pain. No weight loss. Appetite is good. Denies any swelling in the legs.  REVIEW OF SYSTEMS:  A complete 10 point review of system is done which is negative except mentioned above/history of present illness.   PAST MEDICAL HISTORY :  Past Medical History:  Diagnosis Date  . Atrophic vaginitis   . Breast cancer ([place]) 2011   RT LUMPECTOMY W/RADIATION  . DM (diabetes mellitus) ([place])   . Flank pain   . Heart disease   . Hematuria, microscopic   . Hydronephrosis   . Over weight   . Urethral stricture   . Urge incontinence   . Vaginal yeast infection     PAST SURGICAL HISTORY :   Past Surgical History:  Procedure Laterality Date  .  ABDOMINAL HYSTERECTOMY    . APPENDECTOMY    . [place] flap ureteroscopy    . FOOT SURGERY    . GALLBLADDER SURGERY    . TONSILLECTOMY      FAMILY HISTORY :   Family History  Problem Relation Age of Onset  . Diabetes    . Breast cancer    . Heart disease    . Kidney disease Neg Hx   . Bladder Cancer Neg Hx     SOCIAL HISTORY:   Social History  Substance Use Topics  . Smoking status: Never Smoker  . Smokeless tobacco: Never Used  . Alcohol use No    ALLERGIES:  is allergic to ciprofloxacin; doxycycline; nitrofurantoin monohyd macro; nsaids; penicillins; pseudoephedrine hcl; sulfa antibiotics; and terazosin.  MEDICATIONS:  Current Outpatient Prescriptions  Medication Sig Dispense Refill  . amitriptyline (ELAVIL) 50 MG tablet TAKE 1 TABLET EVERY DAY    . atorvastatin (LIPITOR) 10 MG tablet TAKE 1 TABLET ONE TIME DAILY  FOR  CHOLESTEROL    . gabapentin (NEURONTIN) 300 MG capsule TAKE 2 CAPSULES THREE TIMES DAILY    . valsartan-hydrochlorothiazide (DIOVAN-HCT) 80-12.5 MG tablet TAKE 1 TABLET EVERY DAY     No current facility-administered medications for this visit.     PHYSICAL EXAMINATION: ECOG PERFORMANCE STATUS: 0 - Asymptomatic  BP 120/75 (BP Location: Left Arm, Patient Position: Sitting)   Pulse (!) 109   Temp 98.4 F (36.9 C) (Tympanic)   Resp 16  Wt 214 lb 2 oz (97.1 kg)   BMI 31.62 kg/m   Filed Weights   10/06/16 1057  Weight: 214 lb 2 oz (97.1 kg)    GENERAL: Well-nourished well-developed; Alert, no distress and comfortable.  She is accompanied by her husband. Walks with a cane.  EYES: no pallor or icterus OROPHARYNX: no thrush or ulceration; good dentition  NECK: supple, no masses felt LYMPH:  no palpable lymphadenopathy in the cervical, axillary or inguinal regions LUNGS: clear to auscultation and  No wheeze or crackles HEART/CVS: regular rate & rhythm and no murmurs; No lower extremity edema ABDOMEN:abdomen soft, non-tender and normal bowel  sounds Musculoskeletal:no cyanosis of digits and no clubbing  PSYCH: alert & oriented x 3 with fluent speech NEURO: no focal motor/sensory deficits SKIN:  no rashes or significant lesions  LABORATORY DATA:  I have reviewed the data as listed    Component Value Date/Time   NA 139 03/12/2016 1441   NA 141 11/21/2012 1417   K 3.7 03/12/2016 1441   K 4.3 03/08/2013 1154   CL 103 03/12/2016 1441   CL 106 11/21/2012 1417   CO2 29 03/12/2016 1441   CO2 29 11/21/2012 1417   GLUCOSE 160 (H) 03/12/2016 1441   GLUCOSE 135 (H) 11/21/2012 1417   BUN 34 (H) 03/12/2016 1441   BUN 30 (H) 11/21/2012 1417   CREATININE 1.28 (H) 03/12/2016 1441   CREATININE 1.44 (H) 03/06/2014 1415   CALCIUM 9.6 03/12/2016 1441   CALCIUM 9.4 11/21/2012 1417   PROT 7.5 03/12/2016 1441   PROT 7.1 03/06/2014 1415   ALBUMIN 4.2 03/12/2016 1441   ALBUMIN 3.5 03/06/2014 1415   AST 33 03/12/2016 1441   AST 35 03/06/2014 1415   ALT 22 03/12/2016 1441   ALT 45 03/06/2014 1415   ALKPHOS 74 03/12/2016 1441   ALKPHOS 89 03/06/2014 1415   BILITOT 0.6 03/12/2016 1441   BILITOT 0.4 03/06/2014 1415   GFRNONAA 39 (L) 03/12/2016 1441   GFRNONAA 38 (L) 03/06/2014 1415   GFRNONAA 34 (L) 02/26/2013 1404   GFRAA 45 (L) 03/12/2016 1441   GFRAA 46 (L) 03/06/2014 1415   GFRAA 39 (L) 02/26/2013 1404    No results found for: SPEP, UPEP  Lab Results  Component Value Date   WBC 6.4 03/12/2016   NEUTROABS 3.5 03/12/2016   HGB 12.4 03/12/2016   HCT 35.5 03/12/2016   MCV 92.9 03/12/2016   PLT 170 03/12/2016      Chemistry      Component Value Date/Time   NA 139 03/12/2016 1441   NA 141 11/21/2012 1417   K 3.7 03/12/2016 1441   K 4.3 03/08/2013 1154   CL 103 03/12/2016 1441   CL 106 11/21/2012 1417   CO2 29 03/12/2016 1441   CO2 29 11/21/2012 1417   BUN 34 (H) 03/12/2016 1441   BUN 30 (H) 11/21/2012 1417   CREATININE 1.28 (H) 03/12/2016 1441   CREATININE 1.44 (H) 03/06/2014 1415      Component Value Date/Time    CALCIUM 9.6 03/12/2016 1441   CALCIUM 9.4 11/21/2012 1417   ALKPHOS 74 03/12/2016 1441   ALKPHOS 89 03/06/2014 1415   AST 33 03/12/2016 1441   AST 35 03/06/2014 1415   ALT 22 03/12/2016 1441   ALT 45 03/06/2014 1415   BILITOT 0.6 03/12/2016 1441   BILITOT 0.4 03/06/2014 1415       RADIOGRAPHIC STUDIES: I have personally reviewed the radiological images as listed and agreed with the findings  in the report. No results found.   ASSESSMENT & PLAN:  Carcinoma of overlapping sites of right breast in female, estrogen receptor positive ([place]) # Right breast cancer stage II- ER/PR pos s/p Lumpec & RT. July 2017 mammo-NEG; clinically- no evidence of disease [C discussion below]  # Intermittent hypercalcemia- up to 11- almost going back to 2015. Reviewed recent workup with PCP PTH normal; M protein normal. Given the chronicity of the hypercalcemia/ without any significant worsening over the last many years- I suspect patient has some benign cause of hypercalcemia. Question hydrochlorothiazide versus others. Recommend evaluation with nephrology versus endocrinology- per PCP discretion.  # CKD- Stage III/stable- 1.2 creatinine.  #  Follow up in 12 months/ cbc/ cmp/mammogram in July 2018 [Dr.Walker]; I discussed the above plan with the patient and husband in detail. They agree.   Orders Placed This Encounter  Procedures  . CBC with Differential/Platelet    Standing Status:   Future    Standing Expiration Date:   04/07/2018  . Comprehensive metabolic panel    Standing Status:   Future    Standing Expiration Date:   04/07/2018   All questions were answered. The patient knows to call the clinic with any problems, questions or concerns.      Cammie Sickle, MD 10/06/2016 1:09 PM

## 2016-10-06 NOTE — Progress Notes (Signed)
Patient here today for follow up.   

## 2016-11-24 DIAGNOSIS — E119 Type 2 diabetes mellitus without complications: Secondary | ICD-10-CM | POA: Diagnosis not present

## 2016-12-02 DIAGNOSIS — I10 Essential (primary) hypertension: Secondary | ICD-10-CM | POA: Diagnosis not present

## 2016-12-02 DIAGNOSIS — R739 Hyperglycemia, unspecified: Secondary | ICD-10-CM | POA: Diagnosis not present

## 2016-12-02 DIAGNOSIS — N183 Chronic kidney disease, stage 3 (moderate): Secondary | ICD-10-CM | POA: Diagnosis not present

## 2016-12-27 ENCOUNTER — Ambulatory Visit
Admission: RE | Admit: 2016-12-27 | Discharge: 2016-12-27 | Disposition: A | Discharge status: Home / Self Care | Payer: PPO | Source: Ambulatory Visit | Attending: Internal Medicine | Admitting: Internal Medicine

## 2016-12-27 ENCOUNTER — Other Ambulatory Visit: Payer: Self-pay | Admitting: Internal Medicine

## 2016-12-27 DIAGNOSIS — C50811 Malignant neoplasm of overlapping sites of right female breast: Secondary | ICD-10-CM

## 2016-12-27 DIAGNOSIS — R922 Inconclusive mammogram: Secondary | ICD-10-CM | POA: Diagnosis not present

## 2016-12-27 DIAGNOSIS — N6311 Unspecified lump in the right breast, upper outer quadrant: Secondary | ICD-10-CM | POA: Diagnosis not present

## 2016-12-27 HISTORY — DX: Personal history of irradiation: Z92.3

## 2016-12-28 ENCOUNTER — Other Ambulatory Visit: Payer: Self-pay | Admitting: Internal Medicine

## 2016-12-28 DIAGNOSIS — R928 Other abnormal and inconclusive findings on diagnostic imaging of breast: Secondary | ICD-10-CM

## 2017-01-05 ENCOUNTER — Ambulatory Visit
Admission: RE | Admit: 2017-01-05 | Discharge: 2017-01-05 | Disposition: A | Discharge status: Home / Self Care | Payer: PPO | Source: Ambulatory Visit | Attending: Internal Medicine | Admitting: Internal Medicine

## 2017-01-05 DIAGNOSIS — N6031 Fibrosclerosis of right breast: Secondary | ICD-10-CM | POA: Insufficient documentation

## 2017-01-05 DIAGNOSIS — N6489 Other specified disorders of breast: Secondary | ICD-10-CM | POA: Insufficient documentation

## 2017-01-05 DIAGNOSIS — R928 Other abnormal and inconclusive findings on diagnostic imaging of breast: Secondary | ICD-10-CM

## 2017-01-05 DIAGNOSIS — N6011 Diffuse cystic mastopathy of right breast: Secondary | ICD-10-CM | POA: Diagnosis not present

## 2017-01-05 HISTORY — PX: BREAST BIOPSY: SHX20

## 2017-01-06 LAB — SURGICAL PATHOLOGY

## 2017-01-10 ENCOUNTER — Other Ambulatory Visit: Payer: Self-pay | Admitting: Internal Medicine

## 2017-01-10 ENCOUNTER — Telehealth: Payer: Self-pay

## 2017-01-10 DIAGNOSIS — C50811 Malignant neoplasm of overlapping sites of right female breast: Secondary | ICD-10-CM

## 2017-01-10 DIAGNOSIS — Z170 Estrogen receptor positive status [ER+]: Principal | ICD-10-CM

## 2017-01-10 NOTE — Telephone Encounter (Signed)
-----   Message from Cammie Sickle, MD sent at 01/10/2017  8:43 AM EDT ----- Anne/Sheena- Please follow up on this; looks like she needs MRI per radiology;I ordered MRI. Please inform pt. Thx

## 2017-01-11 ENCOUNTER — Other Ambulatory Visit: Payer: Self-pay | Admitting: Internal Medicine

## 2017-01-11 DIAGNOSIS — Z853 Personal history of malignant neoplasm of breast: Secondary | ICD-10-CM

## 2017-01-18 ENCOUNTER — Ambulatory Visit (HOSPITAL_COMMUNITY)
Admission: RE | Admit: 2017-01-18 | Discharge: 2017-01-18 | Disposition: A | Discharge status: Home / Self Care | Payer: PPO | Source: Ambulatory Visit | Attending: Internal Medicine | Admitting: Internal Medicine

## 2017-01-18 DIAGNOSIS — Z853 Personal history of malignant neoplasm of breast: Secondary | ICD-10-CM | POA: Insufficient documentation

## 2017-01-18 LAB — POCT I-STAT CREATININE: CREATININE: 1.3 mg/dL — AB (ref 0.44–1.00)

## 2017-01-18 MED ORDER — GADOBENATE DIMEGLUMINE 529 MG/ML IV SOLN
20.0000 mL | Freq: Once | INTRAVENOUS | Status: AC | PRN
  Administered 2017-01-18: 15 mL via INTRAVENOUS

## 2017-01-19 ENCOUNTER — Other Ambulatory Visit: Payer: Self-pay | Admitting: *Deleted

## 2017-01-19 DIAGNOSIS — N63 Unspecified lump in unspecified breast: Secondary | ICD-10-CM

## 2017-01-20 ENCOUNTER — Other Ambulatory Visit: Payer: Self-pay | Admitting: *Deleted

## 2017-01-20 DIAGNOSIS — N63 Unspecified lump in unspecified breast: Secondary | ICD-10-CM

## 2017-01-20 NOTE — Progress Notes (Signed)
Patient notified that [facility] will be calling her to schedule her appointment for her MRI guided breast biopsy.  Answered questions.  Requested she let me know the date of her biopsy so we could schedule her to see Dr. Tamala Julian afterwards.  She is agreeable.

## 2017-01-24 ENCOUNTER — Encounter: Payer: Self-pay | Admitting: *Deleted

## 2017-01-24 ENCOUNTER — Ambulatory Visit
Admission: RE | Admit: 2017-01-24 | Discharge: 2017-01-24 | Disposition: A | Discharge status: Home / Self Care | Payer: PPO | Source: Ambulatory Visit | Attending: Internal Medicine | Admitting: Internal Medicine

## 2017-01-24 DIAGNOSIS — N6489 Other specified disorders of breast: Secondary | ICD-10-CM | POA: Diagnosis not present

## 2017-01-24 DIAGNOSIS — N63 Unspecified lump in unspecified breast: Secondary | ICD-10-CM

## 2017-01-24 DIAGNOSIS — N6011 Diffuse cystic mastopathy of right breast: Secondary | ICD-10-CM | POA: Diagnosis not present

## 2017-01-24 MED ORDER — GADOBENATE DIMEGLUMINE 529 MG/ML IV SOLN: 10.0000 mL | Freq: Once | INTRAVENOUS | Status: DC | PRN

## 2017-01-24 NOTE — Progress Notes (Signed)
  Oncology Nurse Navigator Documentation  Navigator Location: [facility] (01/24/17 1400)   )Navigator Encounter Type: Telephone (01/24/17 1400) Telephone: Outgoing Call (01/24/17 1400)                       Barriers/Navigation Needs: Coordination of Care (01/24/17 1400)   Interventions: Coordination of Care (01/24/17 1400)   Coordination of Care: Appts (01/24/17 1400)                  Time Spent with Patient: 15 (01/24/17 1400)   Called patient to inform her of her follow-up appointment with Dr. Tamala Julian on 02/01/17 @ 10:15.  States she is doing well since her MRI guided biopsy this morning.  She is to call if she has any questions or needs.

## 2017-02-21 DIAGNOSIS — Z853 Personal history of malignant neoplasm of breast: Secondary | ICD-10-CM | POA: Diagnosis not present

## 2017-02-21 DIAGNOSIS — N63 Unspecified lump in unspecified breast: Secondary | ICD-10-CM | POA: Diagnosis not present

## 2017-02-22 ENCOUNTER — Other Ambulatory Visit: Payer: Self-pay | Admitting: Surgery

## 2017-02-22 DIAGNOSIS — N63 Unspecified lump in unspecified breast: Secondary | ICD-10-CM

## 2017-02-24 ENCOUNTER — Other Ambulatory Visit: Payer: Self-pay | Admitting: Surgery

## 2017-02-24 DIAGNOSIS — N63 Unspecified lump in unspecified breast: Secondary | ICD-10-CM

## 2017-03-07 DIAGNOSIS — I10 Essential (primary) hypertension: Secondary | ICD-10-CM | POA: Diagnosis not present

## 2017-03-07 DIAGNOSIS — E119 Type 2 diabetes mellitus without complications: Secondary | ICD-10-CM | POA: Diagnosis not present

## 2017-03-07 DIAGNOSIS — G609 Hereditary and idiopathic neuropathy, unspecified: Secondary | ICD-10-CM | POA: Diagnosis not present

## 2017-03-07 DIAGNOSIS — N183 Chronic kidney disease, stage 3 (moderate): Secondary | ICD-10-CM | POA: Diagnosis not present

## 2017-03-08 ENCOUNTER — Inpatient Hospital Stay: Admission: RE | Admit: 2017-03-08 | Payer: PPO | Source: Ambulatory Visit

## 2017-03-09 ENCOUNTER — Encounter
Admission: RE | Admit: 2017-03-09 | Discharge: 2017-03-09 | Disposition: A | Discharge status: Home / Self Care | Payer: PPO | Source: Ambulatory Visit | Attending: Surgery | Admitting: Surgery

## 2017-03-09 DIAGNOSIS — E119 Type 2 diabetes mellitus without complications: Secondary | ICD-10-CM | POA: Insufficient documentation

## 2017-03-09 DIAGNOSIS — N63 Unspecified lump in unspecified breast: Secondary | ICD-10-CM | POA: Insufficient documentation

## 2017-03-09 DIAGNOSIS — Z01818 Encounter for other preprocedural examination: Secondary | ICD-10-CM | POA: Insufficient documentation

## 2017-03-09 DIAGNOSIS — I10 Essential (primary) hypertension: Secondary | ICD-10-CM | POA: Diagnosis not present

## 2017-03-09 HISTORY — DX: Gastro-esophageal reflux disease without esophagitis: K21.9

## 2017-03-09 HISTORY — DX: Essential (primary) hypertension: I10

## 2017-03-09 HISTORY — DX: Unspecified osteoarthritis, unspecified site: M19.90

## 2017-03-09 LAB — COMPREHENSIVE METABOLIC PANEL
ALK PHOS: 71 U/L (ref 38–126)
ALT: 19 U/L (ref 14–54)
AST: 24 U/L (ref 15–41)
Albumin: 4.2 g/dL (ref 3.5–5.0)
Anion gap: 9 (ref 5–15)
BUN: 34 mg/dL — ABNORMAL HIGH (ref 6–20)
CALCIUM: 10.2 mg/dL (ref 8.9–10.3)
CO2: 29 mmol/L (ref 22–32)
Chloride: 102 mmol/L (ref 101–111)
Creatinine, Ser: 1.26 mg/dL — ABNORMAL HIGH (ref 0.44–1.00)
GFR calc non Af Amer: 39 mL/min — ABNORMAL LOW (ref >60)
GFR, EST AFRICAN AMERICAN: 46 mL/min — AB (ref >60)
Glucose, Bld: 96 mg/dL (ref 65–99)
Potassium: 3.8 mmol/L (ref 3.5–5.1)
SODIUM: 140 mmol/L (ref 135–145)
Total Bilirubin: 0.5 mg/dL (ref 0.3–1.2)
Total Protein: 7.4 g/dL (ref 6.5–8.1)

## 2017-03-09 LAB — DIFFERENTIAL
BASOS ABS: 0 10*3/uL (ref 0–0.1)
Basophils Relative: 1 %
Eosinophils Absolute: 0.4 10*3/uL (ref 0–0.7)
Eosinophils Relative: 5 %
LYMPHS ABS: 2.4 10*3/uL (ref 1.0–3.6)
LYMPHS PCT: 31 %
MONOS PCT: 7 %
Monocytes Absolute: 0.5 10*3/uL (ref 0.2–0.9)
NEUTROS PCT: 56 %
Neutro Abs: 4.4 10*3/uL (ref 1.4–6.5)

## 2017-03-09 LAB — CBC
HEMATOCRIT: 36.5 % (ref 35.0–47.0)
HEMOGLOBIN: 12.7 g/dL (ref 12.0–16.0)
MCH: 32.9 pg (ref 26.0–34.0)
MCHC: 34.8 g/dL (ref 32.0–36.0)
MCV: 94.7 fL (ref 80.0–100.0)
Platelets: 175 10*3/uL (ref 150–440)
RBC: 3.86 MIL/uL (ref 3.80–5.20)
RDW: 14.2 % (ref 11.5–14.5)
WBC: 7.8 10*3/uL (ref 3.6–11.0)

## 2017-03-09 NOTE — Patient Instructions (Signed)
  Your procedure is scheduled [id number] Oct. 2 , 2018. Please call the [facility] at [phone] to find out your arrival time .   Remember: Instructions that are not followed completely may result in serious medical risk, up to and including death, or upon the discretion of your surgeon and anesthesiologist your surgery may need to be rescheduled.    _x___ 1. Do not eat food after midnight night prior to surgery. No gum chewing or hard candies.    May drink the following: water up until 2 hours prior to arrival time.     ____ 2. No Alcohol for 24 hours before or after surgery.   ____ 3. Bring all medications with you on the day of surgery if instructed.    __x__ 4. Notify your doctor if there is any change in your medical condition     (cold, fever, infections).    _____ 5. No smoking 24 hours prior to surgery.     Do not wear jewelry, make-up, hairpins, clips or nail polish.  Do not wear lotions, powders, or perfumes.   Do not shave 48 hours prior to surgery. Men may shave face and neck.  Do not bring valuables to the hospital.    [facility] is not responsible for any belongings or valuables.               Contacts, dentures or bridgework may not be worn into surgery.  Leave your suitcase in the car. After surgery it may be brought to your room.  For patients admitted to the hospital, discharge time is determined by your treatment team.   Patients discharged the day of surgery will not be allowed to drive home.    Please read over the following fact sheets that you were given:   [facility] Preparing for Surgery  ____ Take these medicines the morning of surgery with A SIP OF WATER: NONE    ____ Fleet Enema (as directed)   _x___ Use CHG Soap as directed on instruction sheet  ____ Use inhalers on the day of surgery and bring to hospital day of surgery  ____ Stop metformin 2 days prior to surgery    ____ Take 1/2 of usual insulin dose the night before  surgery and none on the morning of surgery.   ____ Stop Eliquis/Coumadin/Plavix/aspirin on does not apply.  __x__ Stop Anti-inflammatories such as Advil, Aleve, Ibuprofen, Motrin, Naproxen, Naprosyn, Goodies powders or  aspirin products. OK to take Tylenol.   ____ Stop supplements until after surgery.    ____ Bring C-Pap to the hospital.

## 2017-03-14 ENCOUNTER — Other Ambulatory Visit: Payer: PPO

## 2017-03-14 ENCOUNTER — Ambulatory Visit: Payer: PPO | Admitting: Internal Medicine

## 2017-03-15 ENCOUNTER — Ambulatory Visit: Payer: PPO | Admitting: Anesthesiology

## 2017-03-15 ENCOUNTER — Ambulatory Visit
Admission: RE | Admit: 2017-03-15 | Discharge: 2017-03-15 | Disposition: A | Discharge status: Home / Self Care | Payer: PPO | Source: Ambulatory Visit | Attending: Surgery | Admitting: Surgery

## 2017-03-15 ENCOUNTER — Encounter: Payer: Self-pay | Admitting: *Deleted

## 2017-03-15 ENCOUNTER — Encounter
Admission: RE | Disposition: A | Discharge status: Home / Self Care | Payer: Self-pay | Source: Ambulatory Visit | Attending: Surgery

## 2017-03-15 DIAGNOSIS — E119 Type 2 diabetes mellitus without complications: Secondary | ICD-10-CM | POA: Diagnosis not present

## 2017-03-15 DIAGNOSIS — E663 Overweight: Secondary | ICD-10-CM | POA: Diagnosis not present

## 2017-03-15 DIAGNOSIS — N641 Fat necrosis of breast: Secondary | ICD-10-CM | POA: Diagnosis not present

## 2017-03-15 DIAGNOSIS — N63 Unspecified lump in unspecified breast: Secondary | ICD-10-CM

## 2017-03-15 DIAGNOSIS — Z79899 Other long term (current) drug therapy: Secondary | ICD-10-CM | POA: Insufficient documentation

## 2017-03-15 DIAGNOSIS — N6031 Fibrosclerosis of right breast: Secondary | ICD-10-CM | POA: Insufficient documentation

## 2017-03-15 DIAGNOSIS — I10 Essential (primary) hypertension: Secondary | ICD-10-CM | POA: Diagnosis not present

## 2017-03-15 DIAGNOSIS — Z853 Personal history of malignant neoplasm of breast: Secondary | ICD-10-CM | POA: Diagnosis not present

## 2017-03-15 DIAGNOSIS — Z6831 Body mass index (BMI) 31.0-31.9, adult: Secondary | ICD-10-CM | POA: Diagnosis not present

## 2017-03-15 DIAGNOSIS — K219 Gastro-esophageal reflux disease without esophagitis: Secondary | ICD-10-CM | POA: Diagnosis not present

## 2017-03-15 DIAGNOSIS — N6310 Unspecified lump in the right breast, unspecified quadrant: Secondary | ICD-10-CM | POA: Diagnosis not present

## 2017-03-15 DIAGNOSIS — N6489 Other specified disorders of breast: Secondary | ICD-10-CM | POA: Diagnosis not present

## 2017-03-15 HISTORY — PX: BREAST EXCISIONAL BIOPSY: SUR124

## 2017-03-15 HISTORY — PX: PARTIAL MASTECTOMY WITH NEEDLE LOCALIZATION: SHX6008

## 2017-03-15 LAB — GLUCOSE, CAPILLARY
Glucose-Capillary: 124 mg/dL — ABNORMAL HIGH (ref 65–99)
Glucose-Capillary: 109 mg/dL — ABNORMAL HIGH (ref 65–99)

## 2017-03-15 SURGERY — PARTIAL MASTECTOMY WITH NEEDLE LOCALIZATION
Anesthesia: General | Laterality: Right | Wound class: Clean

## 2017-03-15 MED ORDER — LIDOCAINE HCL (CARDIAC) 20 MG/ML IV SOLN
INTRAVENOUS | Status: DC | PRN
  Administered 2017-03-15: 80 mg via INTRAVENOUS

## 2017-03-15 MED ORDER — BUPIVACAINE-EPINEPHRINE (PF) 0.5% -1:200000 IJ SOLN
INTRAMUSCULAR | Status: AC
  Filled 2017-03-15: qty 30

## 2017-03-15 MED ORDER — LIDOCAINE HCL (PF) 2 % IJ SOLN
INTRAMUSCULAR | Status: AC
  Filled 2017-03-15: qty 4

## 2017-03-15 MED ORDER — DEXAMETHASONE SODIUM PHOSPHATE 10 MG/ML IJ SOLN
INTRAMUSCULAR | Status: AC
  Filled 2017-03-15: qty 1

## 2017-03-15 MED ORDER — DEXAMETHASONE SODIUM PHOSPHATE 10 MG/ML IJ SOLN
INTRAMUSCULAR | Status: DC | PRN
Start: 2017-03-15 — End: 2017-03-15
  Administered 2017-03-15: 5 mg via INTRAVENOUS

## 2017-03-15 MED ORDER — ONDANSETRON HCL 4 MG/2ML IJ SOLN
INTRAMUSCULAR | Status: AC
  Filled 2017-03-15: qty 2

## 2017-03-15 MED ORDER — FENTANYL CITRATE (PF) 100 MCG/2ML IJ SOLN: 25.0000 ug | INTRAMUSCULAR | Status: DC | PRN

## 2017-03-15 MED ORDER — FAMOTIDINE 20 MG PO TABS
20.0000 mg | ORAL_TABLET | Freq: Once | ORAL | Status: AC
  Administered 2017-03-15: 20 mg via ORAL

## 2017-03-15 MED ORDER — FENTANYL CITRATE (PF) 100 MCG/2ML IJ SOLN
INTRAMUSCULAR | Status: AC
  Filled 2017-03-15: qty 2

## 2017-03-15 MED ORDER — PHENYLEPHRINE HCL 10 MG/ML IJ SOLN
INTRAMUSCULAR | Status: DC | PRN
  Administered 2017-03-15: 100 ug via INTRAVENOUS
  Administered 2017-03-15 (×2): 200 ug via INTRAVENOUS
  Administered 2017-03-15: 100 ug via INTRAVENOUS
  Administered 2017-03-15 (×4): 200 ug via INTRAVENOUS

## 2017-03-15 MED ORDER — OXYCODONE HCL 5 MG PO TABS: 5.0000 mg | ORAL_TABLET | Freq: Once | ORAL | Status: DC | PRN

## 2017-03-15 MED ORDER — HYDROCODONE-ACETAMINOPHEN 5-325 MG PO TABS: 1.0000 | ORAL_TABLET | ORAL | Status: DC | PRN

## 2017-03-15 MED ORDER — SODIUM CHLORIDE 0.9 % IV SOLN
INTRAVENOUS | Status: DC
  Administered 2017-03-15: 25 mL/h via INTRAVENOUS

## 2017-03-15 MED ORDER — OXYCODONE HCL 5 MG/5ML PO SOLN: 5.0000 mg | Freq: Once | ORAL | Status: DC | PRN

## 2017-03-15 MED ORDER — MEPERIDINE HCL 50 MG/ML IJ SOLN: 6.2500 mg | INTRAMUSCULAR | Status: DC | PRN

## 2017-03-15 MED ORDER — PROMETHAZINE HCL 25 MG/ML IJ SOLN: 6.2500 mg | INTRAMUSCULAR | Status: DC | PRN

## 2017-03-15 MED ORDER — PROPOFOL 10 MG/ML IV BOLUS
INTRAVENOUS | Status: AC
  Filled 2017-03-15: qty 20

## 2017-03-15 MED ORDER — FENTANYL CITRATE (PF) 100 MCG/2ML IJ SOLN
INTRAMUSCULAR | Status: DC | PRN
  Administered 2017-03-15: 25 ug via INTRAVENOUS

## 2017-03-15 MED ORDER — EPHEDRINE SULFATE 50 MG/ML IJ SOLN
INTRAMUSCULAR | Status: AC
  Filled 2017-03-15: qty 1

## 2017-03-15 MED ORDER — BUPIVACAINE-EPINEPHRINE 0.5% -1:200000 IJ SOLN
INTRAMUSCULAR | Status: DC | PRN
  Administered 2017-03-15: 8 mL

## 2017-03-15 MED ORDER — PROPOFOL 10 MG/ML IV BOLUS
INTRAVENOUS | Status: DC | PRN
  Administered 2017-03-15: 100 mg via INTRAVENOUS

## 2017-03-15 MED ORDER — HYDROCODONE-ACETAMINOPHEN 5-325 MG PO TABS: 1.0000 | ORAL_TABLET | ORAL | 0 refills | Status: DC | PRN

## 2017-03-15 MED ORDER — FAMOTIDINE 20 MG PO TABS
ORAL_TABLET | ORAL | Status: AC
  Administered 2017-03-15: 20 mg via ORAL
  Filled 2017-03-15: qty 1

## 2017-03-15 SURGICAL SUPPLY — 29 items
ADH SKN CLS APL DERMABOND .7 (GAUZE/BANDAGES/DRESSINGS) ×1
BLADE SURG 15 STRL LF DISP TIS (BLADE) ×1 IMPLANT
BLADE SURG 15 STRL SS (BLADE) ×3
CANISTER SUCT 1200ML W/VALVE (MISCELLANEOUS) ×3 IMPLANT
CHLORAPREP W/TINT 26ML (MISCELLANEOUS) ×3 IMPLANT
DERMABOND ADVANCED (GAUZE/BANDAGES/DRESSINGS) ×2
DERMABOND ADVANCED .7 DNX12 (GAUZE/BANDAGES/DRESSINGS) ×1 IMPLANT
DEVICE DUBIN SPECIMEN MAMMOGRA (MISCELLANEOUS) ×3 IMPLANT
DRAPE LAPAROTOMY 77X122 PED (DRAPES) ×3 IMPLANT
ELECT REM PT RETURN 9FT ADLT (ELECTROSURGICAL) ×3
ELECTRODE REM PT RTRN 9FT ADLT (ELECTROSURGICAL) ×1 IMPLANT
GLOVE BIO SURGEON STRL SZ7.5 (GLOVE) ×13 IMPLANT
GOWN STRL REUS W/ TWL LRG LVL3 (GOWN DISPOSABLE) ×2 IMPLANT
GOWN STRL REUS W/TWL LRG LVL3 (GOWN DISPOSABLE) ×12
KIT RM TURNOVER STRD PROC AR (KITS) ×3 IMPLANT
LABEL OR SOLS (LABEL) ×3 IMPLANT
MARGIN MAP 10MM (MISCELLANEOUS) ×3 IMPLANT
NDL HYPO 25X1 1.5 SAFETY (NEEDLE) ×1 IMPLANT
NEEDLE HYPO 25X1 1.5 SAFETY (NEEDLE) ×3 IMPLANT
PACK BASIN MINOR ARMC (MISCELLANEOUS) ×3 IMPLANT
SUT CHROMIC 3 0 SH 27 (SUTURE) IMPLANT
SUT CHROMIC 4 0 RB 1X27 (SUTURE) ×3 IMPLANT
SUT ETHILON 3-0 FS-10 30 BLK (SUTURE) ×3
SUT MNCRL 4-0 (SUTURE) ×3
SUT MNCRL 4-0 27XMFL (SUTURE) ×1
SUTURE EHLN 3-0 FS-10 30 BLK (SUTURE) ×1 IMPLANT
SUTURE MNCRL 4-0 27XMF (SUTURE) ×1 IMPLANT
SYRINGE 10CC LL (SYRINGE) ×3 IMPLANT
WATER STERILE IRR 1000ML POUR (IV SOLUTION) ×3 IMPLANT

## 2017-03-15 NOTE — H&P (Signed)
  She comes in today for excision of right breast mass. She does have history of carcinoma of the right breast and previous surgery and radiation.  She recently had a mammogram demonstrating some distortion in the upper outer quadrant of the right breast. Ultrasound demonstrated an irregular mixed echogenicity with distortion and posterior acoustic shadowing at the 9:30 position ultrasound-guided biopsy demonstrated stromal fibrosis and fibrocystic changes. She also had MRI of the breast seeing an area of non-mass enhancement MRI guided biopsy demonstrated fibrocystic changes hyalinized stroma and chronic inflammation. The radiologist recommended excision as of this tissue for further study.  I reviewed the mammogram images done after insertion of Kopan's wire marking the site for excision.  She reports no change in overall condition since the office exam.  A discussed the plan for excision of right breast mass  The right side was marked YES

## 2017-03-15 NOTE — Discharge Instructions (Signed)
Take Tylenol or Norco if needed for pain. ° °Should not drive or do anything dangerous when taking Norco. ° °May shower and blot dry. ° °Wear bra as desired for comfort and support. °

## 2017-03-15 NOTE — Anesthesia Postprocedure Evaluation (Signed)
Anesthesia Post Note  Patient: Danielle Nixon  Procedure(s) Performed: PARTIAL MASTECTOMY WITH NEEDLE LOCALIZATION (Right )  Patient location during evaluation: PACU Anesthesia Type: General Level of consciousness: awake and alert and oriented Pain management: pain level controlled Vital Signs Assessment: post-procedure vital signs reviewed and stable Respiratory status: spontaneous breathing, nonlabored ventilation and respiratory function stable Cardiovascular status: blood pressure returned to baseline and stable Postop Assessment: no signs of nausea or vomiting Anesthetic complications: no     Last Vitals:  Vitals:   03/15/17 1331 03/15/17 1341  BP: 135/62   Pulse: 96 92  Resp: 17 (!) 9  Temp: (!) 36.3 C   SpO2: 100% 93%    Last Pain:  Vitals:   03/15/17 1354  TempSrc:   PainSc: 0-No pain                 Makesha Belitz

## 2017-03-15 NOTE — Anesthesia Procedure Notes (Signed)
Procedure Name: LMA Insertion Date/Time: 03/15/2017 12:10 PM Performed by: Johnna Acosta Pre-anesthesia Checklist: Patient identified, Patient being monitored, Timeout performed, Emergency Drugs available and Suction available Patient Re-evaluated:Patient Re-evaluated prior to induction Oxygen Delivery Method: Circle system utilized Preoxygenation: Pre-oxygenation with 100% oxygen Induction Type: IV induction Ventilation: Mask ventilation without difficulty LMA: LMA inserted LMA Size: 4.0 Tube type: Oral Number of attempts: 1 Placement Confirmation: positive ETCO2 and breath sounds checked- equal and bilateral Tube secured with: Tape Dental Injury: Teeth and Oropharynx as per pre-operative assessment

## 2017-03-15 NOTE — Addendum Note (Signed)
Addendum  created 03/15/17 1514 by Doreen Salvage, CRNA   Anesthesia Attestations filed

## 2017-03-15 NOTE — Anesthesia Post-op Follow-up Note (Signed)
Anesthesia QCDR form completed.        

## 2017-03-15 NOTE — Op Note (Signed)
OPERATIVE REPORT  PREOPERATIVE  DIAGNOSIS: . Right breast mass  POSTOPERATIVE DIAGNOSIS: . Right breast mass  PROCEDURE: . Excision right breast mass  ANESTHESIA:  General  SURGEON: Rochel Brome  MD   INDICATIONS: . She has history of carcinoma of the right breast. She had recent findings of distortion seen on right mammogram and also density seen on ultrasound and enhancement seen on MRI. She has had 2 biopsies with benign findings of fibrosis. Excision of tissue around these biopsy sites was recommended for further evaluation. The patient did have preoperative insertion of Kopan's wires to mark the 2 biopsy sites in the lateral aspect of the right breast.  With the patient on the operating table in the supine position she was placed under general anesthesia. The dressing was removed from the right breast exposing the Kopan's wires. The Kopan's wires were cut 2 cm from the skin. The breast and surrounding chest wall were prepared with ChloraPrep and draped in a sterile manner. X-ray images were reviewed prior to incision.  An incision was made in the lateral aspect of the right breast oriented transversely removing an ellipse of skin and dissecting down through subcutaneous tissues to encounter some firm tissue adjacent to the Kopan's wires. A mass of tissue approximately 4 x 4 x 3 cm in dimension was removed. This was labeled with margin maps to mark the medial lateral cranial caudal superficial and deep margins. The specimen was submitted for specimen mammogram and pathology. The specimen mammogram  demonstrated the 2 markers. The wound was inspected. One clamp bleeding point was suture ligated with 3-0 chromic. Several other small bleeding points were cauterized. Hemostasis was subsequently intact. The subcuticular tissues were infiltrated with half percent Sensorcaine with epinephrine. Deeper tissues surrounding cautery artifact were infiltrated as well. Portions of breast tissue were  approximated with 3-0 chromic. The skin was closed with a running 4-0 Monocryl subcuticular suture and Dermabond.  The patient tolerated surgery satisfactorily and was then prepared for transfer to the recovery room  [facility].D.

## 2017-03-15 NOTE — OR Nursing (Signed)
Patient eating saltine crackers and having sips of soda tolerated well

## 2017-03-15 NOTE — Progress Notes (Signed)
Dr. Tamala Julian into see

## 2017-03-15 NOTE — Transfer of Care (Signed)
Immediate Anesthesia Transfer of Care Note  Patient: Danielle Nixon  Procedure(s) Performed: Procedure(s): PARTIAL MASTECTOMY WITH NEEDLE LOCALIZATION (Right)  Patient Location: PACU  Anesthesia Type:General  Level of Consciousness: sedated  Airway & Oxygen Therapy: Patient Spontanous Breathing and Patient connected to face mask oxygen  Post-op Assessment: Report given to RN and Post -op Vital signs reviewed and stable  Post vital signs: Reviewed and stable  Last Vitals:  Vitals:   03/15/17 1041 03/15/17 1331  BP: 120/70 135/62  Pulse: 97 96  Resp: 14 17  Temp: 36.6 C (!) 36.3 C  SpO2: 36% 644%    Complications: No apparent anesthesia complications

## 2017-03-15 NOTE — Anesthesia Preprocedure Evaluation (Signed)
Anesthesia Evaluation  Patient identified by MRN, date of birth, ID band Patient awake    Reviewed: Allergy & Precautions, NPO status , Patient's Chart, lab work & pertinent test results  History of Anesthesia Complications Negative for: history of anesthetic complications  Airway Mallampati: II  TM Distance: >3 FB Neck ROM: Full    Dental  (+) Implants   Pulmonary neg pulmonary ROS, neg sleep apnea, neg COPD,    breath sounds clear to auscultation- rhonchi (-) wheezing      Cardiovascular hypertension, Pt. on medications (-) CAD, (-) Past MI and (-) Cardiac Stents  Rhythm:Regular Rate:Normal - Systolic murmurs and - Diastolic murmurs    Neuro/Psych negative neurological ROS  negative psych ROS   GI/Hepatic Neg liver ROS, GERD  ,  Endo/Other  diabetes (diet controlled)  Renal/GU Renal InsufficiencyRenal disease     Musculoskeletal  (+) Arthritis ,   Abdominal (+) + obese,   Peds  Hematology negative hematology ROS (+)   Anesthesia Other Findings Past Medical History: No date: Arthritis     Comment:  hands No date: Atrophic vaginitis 2011: Breast cancer ([place])     Comment:  RT LUMPECTOMY W/RADIATION No date: DM (diabetes mellitus) ([place])     Comment:  diet controlled No date: Flank pain No date: GERD (gastroesophageal reflux disease) No date: Hematuria, microscopic No date: Hydronephrosis No date: Hypertension No date: Over weight No date: Personal history of radiation therapy No date: Urethral stricture No date: Urge incontinence No date: Vaginal yeast infection   Reproductive/Obstetrics                             Anesthesia Physical Anesthesia Plan  ASA: II  Anesthesia Plan: General   Post-op Pain Management:    Induction: Intravenous  PONV Risk Score and Plan: 2 and Ondansetron and Dexamethasone  Airway Management Planned: LMA  Additional Equipment:   Intra-op  Plan:   Post-operative Plan:   Informed Consent: I have reviewed the patients History and Physical, chart, labs and discussed the procedure including the risks, benefits and alternatives for the proposed anesthesia with the patient or authorized representative who has indicated his/her understanding and acceptance.   Dental advisory given  Plan Discussed with: CRNA and Anesthesiologist  Anesthesia Plan Comments:         Anesthesia Quick Evaluation

## 2017-03-17 LAB — SURGICAL PATHOLOGY

## 2017-05-11 DIAGNOSIS — S79911A Unspecified injury of right hip, initial encounter: Secondary | ICD-10-CM | POA: Diagnosis not present

## 2017-05-11 DIAGNOSIS — S50311A Abrasion of right elbow, initial encounter: Secondary | ICD-10-CM | POA: Diagnosis not present

## 2017-05-11 DIAGNOSIS — M25551 Pain in right hip: Secondary | ICD-10-CM | POA: Diagnosis not present

## 2017-06-09 DIAGNOSIS — I10 Essential (primary) hypertension: Secondary | ICD-10-CM | POA: Diagnosis not present

## 2017-06-09 DIAGNOSIS — E119 Type 2 diabetes mellitus without complications: Secondary | ICD-10-CM | POA: Diagnosis not present

## 2017-06-09 DIAGNOSIS — Z0000 Encounter for general adult medical examination without abnormal findings: Secondary | ICD-10-CM | POA: Diagnosis not present

## 2017-06-09 DIAGNOSIS — I959 Hypotension, unspecified: Secondary | ICD-10-CM | POA: Diagnosis not present

## 2017-06-09 DIAGNOSIS — E7800 Pure hypercholesterolemia, unspecified: Secondary | ICD-10-CM | POA: Diagnosis not present

## 2017-06-14 ENCOUNTER — Emergency Department: Payer: PPO

## 2017-06-14 ENCOUNTER — Encounter: Payer: Self-pay | Admitting: Emergency Medicine

## 2017-06-14 ENCOUNTER — Emergency Department
Admission: EM | Admit: 2017-06-14 | Discharge: 2017-06-14 | Disposition: A | Discharge status: Home / Self Care | Payer: PPO | Attending: Emergency Medicine | Admitting: Emergency Medicine

## 2017-06-14 DIAGNOSIS — H9201 Otalgia, right ear: Secondary | ICD-10-CM | POA: Diagnosis not present

## 2017-06-14 DIAGNOSIS — J209 Acute bronchitis, unspecified: Secondary | ICD-10-CM | POA: Diagnosis not present

## 2017-06-14 DIAGNOSIS — R0981 Nasal congestion: Secondary | ICD-10-CM | POA: Diagnosis not present

## 2017-06-14 DIAGNOSIS — R05 Cough: Secondary | ICD-10-CM | POA: Diagnosis not present

## 2017-06-14 DIAGNOSIS — E119 Type 2 diabetes mellitus without complications: Secondary | ICD-10-CM | POA: Diagnosis not present

## 2017-06-14 DIAGNOSIS — Z79899 Other long term (current) drug therapy: Secondary | ICD-10-CM | POA: Insufficient documentation

## 2017-06-14 DIAGNOSIS — I10 Essential (primary) hypertension: Secondary | ICD-10-CM | POA: Diagnosis not present

## 2017-06-14 LAB — CBC WITH DIFFERENTIAL/PLATELET
BASOS PCT: 1 %
Basophils Absolute: 0 10*3/uL (ref 0–0.1)
EOS ABS: 0.5 10*3/uL (ref 0–0.7)
EOS PCT: 7 %
HCT: 38.3 % (ref 35.0–47.0)
Hemoglobin: 13.3 g/dL (ref 12.0–16.0)
LYMPHS ABS: 2.1 10*3/uL (ref 1.0–3.6)
Lymphocytes Relative: 29 %
MCH: 33.1 pg (ref 26.0–34.0)
MCHC: 34.6 g/dL (ref 32.0–36.0)
MCV: 95.7 fL (ref 80.0–100.0)
MONO ABS: 0.6 10*3/uL (ref 0.2–0.9)
MONOS PCT: 8 %
Neutro Abs: 4.1 10*3/uL (ref 1.4–6.5)
Neutrophils Relative %: 55 %
Platelets: 179 10*3/uL (ref 150–440)
RBC: 4.01 MIL/uL (ref 3.80–5.20)
RDW: 14.8 % — AB (ref 11.5–14.5)
WBC: 7.3 10*3/uL (ref 3.6–11.0)

## 2017-06-14 LAB — COMPREHENSIVE METABOLIC PANEL
ALBUMIN: 4.2 g/dL (ref 3.5–5.0)
ALT: 26 U/L (ref 14–54)
AST: 41 U/L (ref 15–41)
Alkaline Phosphatase: 86 U/L (ref 38–126)
Anion gap: 9 (ref 5–15)
BILIRUBIN TOTAL: 1 mg/dL (ref 0.3–1.2)
BUN: 39 mg/dL — ABNORMAL HIGH (ref 6–20)
CALCIUM: 10.1 mg/dL (ref 8.9–10.3)
CO2: 27 mmol/L (ref 22–32)
Chloride: 103 mmol/L (ref 101–111)
Creatinine, Ser: 1.51 mg/dL — ABNORMAL HIGH (ref 0.44–1.00)
GFR calc non Af Amer: 32 mL/min — ABNORMAL LOW (ref >60)
GFR, EST AFRICAN AMERICAN: 37 mL/min — AB (ref >60)
GLUCOSE: 115 mg/dL — AB (ref 65–99)
POTASSIUM: 4 mmol/L (ref 3.5–5.1)
SODIUM: 139 mmol/L (ref 135–145)
TOTAL PROTEIN: 7.9 g/dL (ref 6.5–8.1)

## 2017-06-14 MED ORDER — IOPAMIDOL (ISOVUE-300) INJECTION 61%
60.0000 mL | Freq: Once | INTRAVENOUS | Status: AC | PRN
  Administered 2017-06-14: 75 mL via INTRAVENOUS
  Filled 2017-06-14: qty 60

## 2017-06-14 MED ORDER — PREDNISONE 10 MG PO TABS: ORAL_TABLET | ORAL | 0 refills | Status: DC

## 2017-06-14 MED ORDER — HYDROCODONE-ACETAMINOPHEN 5-325 MG PO TABS
1.0000 | ORAL_TABLET | Freq: Once | ORAL | Status: AC
  Administered 2017-06-14: 1 via ORAL
  Filled 2017-06-14: qty 1

## 2017-06-14 MED ORDER — CEPHALEXIN 500 MG PO CAPS: 500.0000 mg | ORAL_CAPSULE | Freq: Three times a day (TID) | ORAL | 0 refills | Status: DC

## 2017-06-14 MED ORDER — HYDROCODONE-ACETAMINOPHEN 5-325 MG PO TABS: 1.0000 | ORAL_TABLET | Freq: Four times a day (QID) | ORAL | 0 refills | Status: DC | PRN

## 2017-06-14 MED ORDER — ALBUTEROL SULFATE (2.5 MG/3ML) 0.083% IN NEBU
2.5000 mg | INHALATION_SOLUTION | Freq: Once | RESPIRATORY_TRACT | Status: AC
  Administered 2017-06-14: 2.5 mg via RESPIRATORY_TRACT
  Filled 2017-06-14: qty 3

## 2017-06-14 NOTE — ED Notes (Signed)
See triage note.

## 2017-06-14 NOTE — ED Triage Notes (Signed)
Pt in via POV with complaints of worsening cough, congestion, right ear pain x 2 days.  Vitals WDL, NAD noted at this time.

## 2017-06-14 NOTE — ED Notes (Addendum)
See triage note  States she has had a cough but thinks cough and congestion is worse   Also have right ear pain  Afebrile on arrival  States when tries to take a deep breath she starts to cough

## 2017-06-14 NOTE — Discharge Instructions (Signed)
Follow-up with your primary care provider at [facility] if any continued problems. Begin taking medication as directed. Keflex 500 mg 3 times a day for 10 days. Norco as needed for pain. This medication has been narcotic in it which may increase her risk for falling. And also will decrease the amount of coughing that you're doing. Prednisone 3 tablets once a day for the next 3 days for inflammation.  Return to the emergency department if any severe worsening of your symptoms.

## 2017-06-14 NOTE — ED Provider Notes (Signed)
[facility] Emergency Department Provider Note  ____________________________________________   First MD Initiated Contact with Patient 06/14/17 1503     (approximate)  I have reviewed the triage vital signs and the nursing notes.   HISTORY  Chief Complaint Cough   HPI Danielle Nixon is a 82 y.o. female is here with complaint of cough, congestion, right ear pain for the last 2 days. Patient states the cough has gotten worse and that her chest feels "raw". Patient has taken medication without any relief of her cough. Patient states that she has had bronchitis in the past but has never had pneumonia. She is uncertain of any fever or chills. She denies any nausea or vomiting. She denies any muscle aches or exposure to the flu.   Past Medical History:  Diagnosis Date  . Arthritis    hands  . Atrophic vaginitis   . Breast cancer ([place]) 2011   RT LUMPECTOMY W/RADIATION  . DM (diabetes mellitus) ([place])    diet controlled  . Flank pain   . GERD (gastroesophageal reflux disease)   . Hematuria, microscopic   . Hydronephrosis   . Hypertension   . Over weight   . Personal history of radiation therapy   . Urethral stricture   . Urge incontinence   . Vaginal yeast infection     Patient Active Problem List   Diagnosis Date Noted  . Carcinoma of overlapping sites of right breast in female, estrogen receptor positive ([place]) 10/06/2016  . Urge incontinence 06/17/2015  . Atrophic vaginitis 06/17/2015  . H/O malignant neoplasm of breast 07/18/2013    Past Surgical History:  Procedure Laterality Date  . ABDOMINAL HYSTERECTOMY    . APPENDECTOMY    . [place] flap ureteroscopy    . BREAST BIOPSY Right 2011   +  . BREAST BIOPSY Right 01/05/2017   path pending  . BREAST EXCISIONAL BIOPSY Right 03/15/2017  . FOOT SURGERY    . GALLBLADDER SURGERY    . PARTIAL MASTECTOMY WITH NEEDLE LOCALIZATION Right 03/15/2017   Procedure: PARTIAL MASTECTOMY WITH NEEDLE  LOCALIZATION;  Surgeon: Leonie Green, MD;  Location: [facility] ORS;  Service: General;  Laterality: Right;  . TONSILLECTOMY      Prior to Admission medications   Medication Sig Start Date End Date Taking? Authorizing Provider  amitriptyline (ELAVIL) 50 MG tablet TAKE 1 TABLET EVERY EVENING 09/23/14   [provider]  atorvastatin (LIPITOR) 10 MG tablet TAKE 1 TABLET ONE TIME IN THE EVENING  FOR  CHOLESTEROL 09/23/14   [provider]  cephALEXin (KEFLEX) 500 MG capsule Take 1 capsule (500 mg total) by mouth 3 (three) times daily. 06/14/17   Johnn Hai, PA-C  clindamycin (CLEOCIN) 150 MG capsule TAKE 4 CAPSULES BY MOUTH 1 HOUR PRIOR TO DENTAL APPOINTMENT WITH FOOD 12/07/16   [provider]  gabapentin (NEURONTIN) 300 MG capsule TAKE 2 CAPSULES THREE TIMES DAILY 12/11/14   [provider]  HYDROcodone-acetaminophen (NORCO/VICODIN) 5-325 MG tablet Take 1 tablet by mouth every 6 (six) hours as needed for moderate pain. 06/14/17   Johnn Hai, PA-C  ibuprofen (ADVIL,MOTRIN) 200 MG tablet Take 200 mg by mouth every 8 (eight) hours as needed for mild pain.    [provider]  naproxen sodium (ANAPROX) 220 MG tablet Take 220 mg by mouth daily as needed (PAIN).    [provider]  predniSONE (DELTASONE) 10 MG tablet Take 3 tablets once a day for 3 days 06/14/17  Johnn Hai, PA-C    Allergies Ciprofloxacin; Doxycycline; Nitrofurantoin monohyd macro; Penicillins; Pseudoephedrine hcl; Sulfa antibiotics; and Terazosin  Family History  Problem Relation Age of Onset  . Diabetes Unknown   . Heart disease Unknown   . Breast cancer Cousin   . Kidney disease Neg Hx   . Bladder Cancer Neg Hx     Social History Social History   Tobacco Use  . Smoking status: Never Smoker  . Smokeless tobacco: Never Used  Substance Use Topics  . Alcohol use: No    Alcohol/week: 0.0 oz  . Drug use: No    Review of Systems Constitutional: No  fever/chills Eyes: No visual changes. ENT: No sore throat. positive for right ear pain. Positive for nasal congestion. Cardiovascular: Denies chest pain. Respiratory: Denies shortness of breath. Positive for frequent cough. Gastrointestinal: No abdominal pain.  No nausea, no vomiting.  No diarrhea. Musculoskeletal: Negative for back pain. Skin: Negative for rash. Neurological: Negative for headaches, focal weakness or numbness. ____________________________________________   PHYSICAL EXAM:  VITAL SIGNS: ED Triage Vitals  Enc Vitals Group     BP 06/14/17 1425 103/71     Pulse Rate 06/14/17 1425 (!) 118     Resp 06/14/17 1425 16     Temp 06/14/17 1425 97.6 F (36.4 C)     Temp Source 06/14/17 1425 Oral     SpO2 06/14/17 1425 91 %     Weight 06/14/17 1426 200 lb (90.7 kg)     Height 06/14/17 1426 5\' 9"  (1.753 m)     Head Circumference --      Peak Flow --      Pain Score --      Pain Loc --      Pain Edu? --      Excl. in Hall? --    Constitutional: Alert and oriented. Well appearing and in no acute distress. Eyes: Conjunctivae are normal.  Head: Atraumatic. Nose: mild congestion/rhinnorhea. TMs are dull bilaterally but without erythema or injection. Mouth/Throat: Mucous membranes are moist.  Oropharynx non-erythematous. Mild posterior drainage seen. Neck: No stridor.   Hematological/Lymphatic/Immunilogical: No cervical lymphadenopathy. Cardiovascular: Normal rate, regular rhythm. Grossly normal heart sounds.  Good peripheral circulation. Respiratory: Normal respiratory effort.  No retractions. Lungs with bilateral wheeze but occasionally clears with cough. Patient is frequently coughing and sounds bronchitic in nature. Gastrointestinal: Soft and nontender. No distention.  Musculoskeletal: moves upper and lower extremities without difficulty. Normal gait was noted. Neurologic:  Normal speech and language. No gross focal neurologic deficits are appreciated. No gait  instability. Skin:  Skin is warm, dry and intact. No rash noted. Psychiatric: Mood and affect are normal. Speech and behavior are normal.  ____________________________________________   LABS (all labs ordered are listed, but only abnormal results are displayed)  Labs Reviewed  CBC WITH DIFFERENTIAL/PLATELET - Abnormal; Notable for the following components:      Result Value   RDW 14.8 (*)    All other components within normal limits  COMPREHENSIVE METABOLIC PANEL - Abnormal; Notable for the following components:   Glucose, Bld 115 (*)    BUN 39 (*)    Creatinine, Ser 1.51 (*)    GFR calc non Af Amer 32 (*)    GFR calc Af Amer 37 (*)    All other components within normal limits    RADIOLOGY  Dg Chest 2 View  Result Date: 06/14/2017 CLINICAL DATA:  Worsening coughing congestion. EXAM: CHEST  2 VIEW COMPARISON:  CT chest 05/12/2010.  Chest x-ray 04/02/2006. FINDINGS: The lungs are clear without focal pneumonia, edema, pneumothorax or pleural effusion. Interstitial markings are diffusely coarsened with chronic features. Subtle irregular almost nodular appearance of the peripheral lungs evident. Cardiopericardial silhouette is at upper limits of normal for size. The visualized bony structures of the thorax are intact. IMPRESSION: Peripheral bilateral irregular nodularity. Diffuse atypical infection would be a consideration. Consider CT chest recommended further evaluate. Electronically Signed   By: Misty Stanley M.D.   On: 06/14/2017 15:50   Ct Chest W Contrast  Result Date: 06/14/2017 CLINICAL DATA:  Worsening cough and congestion with right ear pain x2 days. History of right breast excisional biopsy on 03/15/2017 and partial mastectomy. EXAM: CT CHEST WITH CONTRAST TECHNIQUE: Multidetector CT imaging of the chest was performed during intravenous contrast administration. CONTRAST:  8mL ISOVUE-300 IOPAMIDOL (ISOVUE-300) INJECTION 61% COMPARISON:  Same day chest radiographs, chest CT  08/28/2008 FINDINGS: Cardiovascular: Moderate aortic atherosclerosis without aneurysm or dissection. Mild dilatation of the main pulmonary artery to 3 cm consistent with chronic pulmonary hypertension. No acute pulmonary embolus. Heart is borderline enlarged without pericardial effusion or thickening. Mediastinum/Nodes: 5 mm right and 3 mm left hypodense thyroid nodules too small to further characterize. No worrisome features noted. No thyromegaly. Lungs/Pleura: Bilateral subpleural areas of fibrosis are identified with mild upper lobe bronchiectasis. No dominant mass or pneumonic consolidations. No effusion or pneumothoraces. Upper Abdomen: No acute abnormality. Musculoskeletal: Mild skin thickening and postop change associated with the right breast. No aggressive osteolytic or blastic disease. IMPRESSION: 1. Diffuse subpleural areas of fibrosis account for the streaky interstitial lung markings seen radiographically. Mild upper lobe predominant bronchiectasis. 2. Mild dilatation of the main pulmonary artery likely reflective chronic pulmonary hypertension. 3. Postop change of the right breast. 4. Tiny bilateral too small to characterize thyroid nodules. No further evaluation recommendations given size and patient demographic. Aortic Atherosclerosis (ICD10-I70.0). Electronically Signed   By: Ashley Royalty M.D.   On: 06/14/2017 17:41    ____________________________________________   PROCEDURES  Procedure(s) performed: None  Procedures  Critical Care performed: No  ____________________________________________   INITIAL IMPRESSION / ASSESSMENT AND PLAN / ED COURSE Patient was given a nebulizer treatment prior to discharge along with a Norco to help control her cough. Patient was placed on Keflex 500 mg 3 times a day for 10 days. She is also given a prescription for prednisone 30 mg once daily for the next 3 days and a prescription for Norco to help with her rib pain and cough. Patient is to follow-up  with her PCP if any continued problems.  Chest CT was reassuring after her chest x-ray was inconclusive.  ____________________________________________   FINAL CLINICAL IMPRESSION(S) / ED DIAGNOSES  Final diagnoses:  Acute bronchitis, unspecified organism     ED Discharge Orders        Ordered    cephALEXin (KEFLEX) 500 MG capsule  3 times daily     06/14/17 1803    predniSONE (DELTASONE) 10 MG tablet     06/14/17 1803    HYDROcodone-acetaminophen (NORCO/VICODIN) 5-325 MG tablet  Every 6 hours PRN     06/14/17 1803       Note:  This document was prepared using Dragon voice recognition software and may include unintentional dictation errors.    Johnn Hai, PA-C 06/14/17 Virgia Land, MD 06/15/17 2157

## 2017-07-07 DIAGNOSIS — R300 Dysuria: Secondary | ICD-10-CM | POA: Diagnosis not present

## 2017-09-09 DIAGNOSIS — I10 Essential (primary) hypertension: Secondary | ICD-10-CM | POA: Diagnosis not present

## 2017-09-09 DIAGNOSIS — G609 Hereditary and idiopathic neuropathy, unspecified: Secondary | ICD-10-CM | POA: Diagnosis not present

## 2017-09-09 DIAGNOSIS — E119 Type 2 diabetes mellitus without complications: Secondary | ICD-10-CM | POA: Diagnosis not present

## 2017-09-09 DIAGNOSIS — Z13220 Encounter for screening for lipoid disorders: Secondary | ICD-10-CM | POA: Diagnosis not present

## 2017-09-09 DIAGNOSIS — N183 Chronic kidney disease, stage 3 (moderate): Secondary | ICD-10-CM | POA: Diagnosis not present

## 2017-09-09 DIAGNOSIS — Z1231 Encounter for screening mammogram for malignant neoplasm of breast: Secondary | ICD-10-CM | POA: Diagnosis not present

## 2017-09-09 DIAGNOSIS — Z1329 Encounter for screening for other suspected endocrine disorder: Secondary | ICD-10-CM | POA: Diagnosis not present

## 2017-10-05 ENCOUNTER — Inpatient Hospital Stay: Payer: PPO | Admitting: Internal Medicine

## 2017-10-05 ENCOUNTER — Inpatient Hospital Stay: Payer: PPO | Attending: Internal Medicine

## 2017-11-22 ENCOUNTER — Other Ambulatory Visit: Payer: Self-pay | Admitting: General Surgery

## 2017-11-22 DIAGNOSIS — Z1231 Encounter for screening mammogram for malignant neoplasm of breast: Secondary | ICD-10-CM

## 2017-12-06 DIAGNOSIS — H9201 Otalgia, right ear: Secondary | ICD-10-CM | POA: Diagnosis not present

## 2017-12-06 DIAGNOSIS — H6121 Impacted cerumen, right ear: Secondary | ICD-10-CM | POA: Diagnosis not present

## 2018-01-20 ENCOUNTER — Other Ambulatory Visit: Payer: Self-pay | Admitting: Otolaryngology

## 2018-01-20 DIAGNOSIS — R221 Localized swelling, mass and lump, neck: Secondary | ICD-10-CM

## 2018-01-20 DIAGNOSIS — S01312A Laceration without foreign body of left ear, initial encounter: Secondary | ICD-10-CM | POA: Diagnosis not present

## 2018-01-20 DIAGNOSIS — H9201 Otalgia, right ear: Secondary | ICD-10-CM

## 2018-02-07 ENCOUNTER — Ambulatory Visit: Admission: RE | Admit: 2018-02-07 | Payer: PPO | Source: Ambulatory Visit

## 2018-02-09 ENCOUNTER — Ambulatory Visit
Admission: RE | Admit: 2018-02-09 | Discharge: 2018-02-09 | Disposition: A | Discharge status: Home / Self Care | Payer: PPO | Source: Ambulatory Visit | Attending: Otolaryngology | Admitting: Otolaryngology

## 2018-02-09 DIAGNOSIS — R221 Localized swelling, mass and lump, neck: Secondary | ICD-10-CM | POA: Diagnosis not present

## 2018-02-09 DIAGNOSIS — M542 Cervicalgia: Secondary | ICD-10-CM | POA: Diagnosis not present

## 2018-02-09 DIAGNOSIS — Z882 Allergy status to sulfonamides status: Secondary | ICD-10-CM | POA: Insufficient documentation

## 2018-02-09 DIAGNOSIS — H9201 Otalgia, right ear: Secondary | ICD-10-CM | POA: Diagnosis not present

## 2018-02-09 LAB — POCT I-STAT CREATININE: Creatinine, Ser: 1.2 mg/dL — ABNORMAL HIGH (ref 0.44–1.00)

## 2018-02-09 MED ORDER — IOHEXOL 300 MG/ML  SOLN
75.0000 mL | Freq: Once | INTRAMUSCULAR | Status: AC | PRN
  Administered 2018-02-09: 60 mL via INTRAVENOUS

## 2018-03-03 DIAGNOSIS — Z13220 Encounter for screening for lipoid disorders: Secondary | ICD-10-CM | POA: Diagnosis not present

## 2018-03-03 DIAGNOSIS — M479 Spondylosis, unspecified: Secondary | ICD-10-CM | POA: Diagnosis not present

## 2018-03-03 DIAGNOSIS — M5412 Radiculopathy, cervical region: Secondary | ICD-10-CM | POA: Diagnosis not present

## 2018-03-03 DIAGNOSIS — I10 Essential (primary) hypertension: Secondary | ICD-10-CM | POA: Diagnosis not present

## 2018-03-03 DIAGNOSIS — Z1329 Encounter for screening for other suspected endocrine disorder: Secondary | ICD-10-CM | POA: Diagnosis not present

## 2018-03-03 DIAGNOSIS — M47812 Spondylosis without myelopathy or radiculopathy, cervical region: Secondary | ICD-10-CM | POA: Diagnosis not present

## 2018-03-06 ENCOUNTER — Other Ambulatory Visit: Payer: Self-pay | Admitting: Student

## 2018-03-06 DIAGNOSIS — M5412 Radiculopathy, cervical region: Secondary | ICD-10-CM

## 2018-03-13 DIAGNOSIS — Z23 Encounter for immunization: Secondary | ICD-10-CM | POA: Diagnosis not present

## 2018-03-13 DIAGNOSIS — G609 Hereditary and idiopathic neuropathy, unspecified: Secondary | ICD-10-CM | POA: Diagnosis not present

## 2018-03-13 DIAGNOSIS — N183 Chronic kidney disease, stage 3 (moderate): Secondary | ICD-10-CM | POA: Diagnosis not present

## 2018-03-13 DIAGNOSIS — Z0000 Encounter for general adult medical examination without abnormal findings: Secondary | ICD-10-CM | POA: Diagnosis not present

## 2018-03-13 DIAGNOSIS — Z1329 Encounter for screening for other suspected endocrine disorder: Secondary | ICD-10-CM | POA: Diagnosis not present

## 2018-03-13 DIAGNOSIS — E7800 Pure hypercholesterolemia, unspecified: Secondary | ICD-10-CM | POA: Diagnosis not present

## 2018-03-13 DIAGNOSIS — I10 Essential (primary) hypertension: Secondary | ICD-10-CM | POA: Diagnosis not present

## 2018-03-13 DIAGNOSIS — E119 Type 2 diabetes mellitus without complications: Secondary | ICD-10-CM | POA: Diagnosis not present

## 2018-03-21 ENCOUNTER — Ambulatory Visit
Admission: RE | Admit: 2018-03-21 | Discharge: 2018-03-21 | Disposition: A | Discharge status: Home / Self Care | Payer: PPO | Source: Ambulatory Visit | Attending: Student | Admitting: Student

## 2018-03-21 DIAGNOSIS — M542 Cervicalgia: Secondary | ICD-10-CM | POA: Diagnosis not present

## 2018-03-21 DIAGNOSIS — M5412 Radiculopathy, cervical region: Secondary | ICD-10-CM | POA: Diagnosis not present

## 2018-03-27 DIAGNOSIS — M5030 Other cervical disc degeneration, unspecified cervical region: Secondary | ICD-10-CM | POA: Diagnosis not present

## 2018-03-27 DIAGNOSIS — M4802 Spinal stenosis, cervical region: Secondary | ICD-10-CM | POA: Diagnosis not present

## 2018-03-27 DIAGNOSIS — M5412 Radiculopathy, cervical region: Secondary | ICD-10-CM | POA: Diagnosis not present

## 2018-03-27 DIAGNOSIS — M47812 Spondylosis without myelopathy or radiculopathy, cervical region: Secondary | ICD-10-CM | POA: Diagnosis not present

## 2018-04-25 DIAGNOSIS — M47812 Spondylosis without myelopathy or radiculopathy, cervical region: Secondary | ICD-10-CM | POA: Diagnosis not present

## 2018-05-16 DIAGNOSIS — M4802 Spinal stenosis, cervical region: Secondary | ICD-10-CM | POA: Diagnosis not present

## 2018-05-16 DIAGNOSIS — M5030 Other cervical disc degeneration, unspecified cervical region: Secondary | ICD-10-CM | POA: Diagnosis not present

## 2018-05-16 DIAGNOSIS — M47812 Spondylosis without myelopathy or radiculopathy, cervical region: Secondary | ICD-10-CM | POA: Diagnosis not present

## 2018-05-16 DIAGNOSIS — M62838 Other muscle spasm: Secondary | ICD-10-CM | POA: Diagnosis not present

## 2018-05-16 DIAGNOSIS — M5412 Radiculopathy, cervical region: Secondary | ICD-10-CM | POA: Diagnosis not present

## 2018-07-11 DIAGNOSIS — M47812 Spondylosis without myelopathy or radiculopathy, cervical region: Secondary | ICD-10-CM | POA: Diagnosis not present

## 2018-07-11 DIAGNOSIS — M5412 Radiculopathy, cervical region: Secondary | ICD-10-CM | POA: Diagnosis not present

## 2018-07-11 DIAGNOSIS — M5030 Other cervical disc degeneration, unspecified cervical region: Secondary | ICD-10-CM | POA: Diagnosis not present

## 2018-10-18 ENCOUNTER — Other Ambulatory Visit: Payer: Self-pay | Admitting: Internal Medicine

## 2018-10-18 ENCOUNTER — Other Ambulatory Visit: Payer: Self-pay

## 2018-10-18 ENCOUNTER — Encounter (INDEPENDENT_AMBULATORY_CARE_PROVIDER_SITE_OTHER): Payer: Self-pay

## 2018-10-18 ENCOUNTER — Ambulatory Visit
Admission: RE | Admit: 2018-10-18 | Discharge: 2018-10-18 | Disposition: A | Discharge status: Home / Self Care | Payer: PPO | Source: Ambulatory Visit | Attending: Internal Medicine | Admitting: Internal Medicine

## 2018-10-18 ENCOUNTER — Other Ambulatory Visit (HOSPITAL_COMMUNITY): Payer: Self-pay | Admitting: Internal Medicine

## 2018-10-18 DIAGNOSIS — N183 Chronic kidney disease, stage 3 (moderate): Secondary | ICD-10-CM | POA: Diagnosis not present

## 2018-10-18 DIAGNOSIS — Z0001 Encounter for general adult medical examination with abnormal findings: Secondary | ICD-10-CM | POA: Diagnosis not present

## 2018-10-18 DIAGNOSIS — E119 Type 2 diabetes mellitus without complications: Secondary | ICD-10-CM | POA: Diagnosis not present

## 2018-10-18 DIAGNOSIS — Z0000 Encounter for general adult medical examination without abnormal findings: Secondary | ICD-10-CM | POA: Diagnosis not present

## 2018-10-18 DIAGNOSIS — I10 Essential (primary) hypertension: Secondary | ICD-10-CM | POA: Diagnosis not present

## 2018-10-18 DIAGNOSIS — C50811 Malignant neoplasm of overlapping sites of right female breast: Secondary | ICD-10-CM | POA: Diagnosis not present

## 2018-10-18 DIAGNOSIS — G609 Hereditary and idiopathic neuropathy, unspecified: Secondary | ICD-10-CM | POA: Diagnosis not present

## 2018-10-18 DIAGNOSIS — M79662 Pain in left lower leg: Secondary | ICD-10-CM | POA: Diagnosis not present

## 2019-04-20 DIAGNOSIS — E119 Type 2 diabetes mellitus without complications: Secondary | ICD-10-CM | POA: Diagnosis not present

## 2019-04-20 DIAGNOSIS — E7800 Pure hypercholesterolemia, unspecified: Secondary | ICD-10-CM | POA: Diagnosis not present

## 2019-04-20 DIAGNOSIS — L97521 Non-pressure chronic ulcer of other part of left foot limited to breakdown of skin: Secondary | ICD-10-CM | POA: Diagnosis not present

## 2019-04-20 DIAGNOSIS — Z23 Encounter for immunization: Secondary | ICD-10-CM | POA: Diagnosis not present

## 2019-04-20 DIAGNOSIS — N183 Chronic kidney disease, stage 3 unspecified: Secondary | ICD-10-CM | POA: Diagnosis not present

## 2019-04-20 DIAGNOSIS — I10 Essential (primary) hypertension: Secondary | ICD-10-CM | POA: Diagnosis not present

## 2019-04-26 DIAGNOSIS — L97521 Non-pressure chronic ulcer of other part of left foot limited to breakdown of skin: Secondary | ICD-10-CM | POA: Diagnosis not present

## 2019-04-26 DIAGNOSIS — E1142 Type 2 diabetes mellitus with diabetic polyneuropathy: Secondary | ICD-10-CM | POA: Diagnosis not present

## 2019-05-17 DIAGNOSIS — E1142 Type 2 diabetes mellitus with diabetic polyneuropathy: Secondary | ICD-10-CM | POA: Diagnosis not present

## 2019-05-17 DIAGNOSIS — L97521 Non-pressure chronic ulcer of other part of left foot limited to breakdown of skin: Secondary | ICD-10-CM | POA: Diagnosis not present

## 2019-06-05 DIAGNOSIS — E1142 Type 2 diabetes mellitus with diabetic polyneuropathy: Secondary | ICD-10-CM | POA: Diagnosis not present

## 2019-06-05 DIAGNOSIS — L97522 Non-pressure chronic ulcer of other part of left foot with fat layer exposed: Secondary | ICD-10-CM | POA: Diagnosis not present

## 2019-06-12 ENCOUNTER — Other Ambulatory Visit: Payer: Self-pay

## 2019-06-12 ENCOUNTER — Encounter
Admission: RE | Admit: 2019-06-12 | Discharge: 2019-06-12 | Disposition: A | Discharge status: Home / Self Care | Payer: PPO | Source: Ambulatory Visit | Attending: Podiatry | Admitting: Podiatry

## 2019-06-12 NOTE — Patient Instructions (Signed)
Your procedure is scheduled on: June 22, 2019 Report to Day Surgery on the 2nd floor of the [facility]. To find out your arrival time, please call [phone] between 1PM - 3PM on:  June 21, 2019  REMEMBER: Instructions that are not followed completely may result in serious medical risk, up to and including death; or upon the discretion of your surgeon and anesthesiologist your surgery may need to be rescheduled.  Do not eat food after midnight the night before surgery.  No gum chewing, lozengers or hard candies.  You may however, drink CLEAR liquids up to 2 hours before you are scheduled to arrive for your surgery. Do not drink anything within 2 hours of the start of your surgery.  Clear liquids include: - water   Do NOT drink anything that is not on this list.  Type 1 and Type 2 diabetics should only drink water.  No Alcohol for 24 hours before or after surgery.  No Smoking including e-cigarettes for 24 hours prior to surgery.  No chewable tobacco products for at least 6 hours prior to surgery.  No nicotine patches on the day of surgery.  On the morning of surgery brush your teeth with toothpaste and water, you may rinse your mouth with mouthwash if you wish. Do not swallow any toothpaste or mouthwash.  Notify your doctor if there is any change in your medical condition (cold, fever, infection).  Do not wear jewelry, make-up, hairpins, clips or nail polish.  Do not wear lotions, powders, or perfumes.   Do not shave 48 hours prior to surgery.   Contacts and dentures may not be worn into surgery.  Do not bring valuables to the hospital, including drivers license, insurance or credit cards.  [facility] is not responsible for any belongings or valuables.   TAKE THESE MEDICATIONS THE MORNING OF SURGERY: none  Use CHG Soap as directed on instruction sheet.  Follow recommendations from Cardiologist, Pulmonologist or PCP regarding stopping Aspirin, Coumadin, Plavix,  Eliquis, Pradaxa, or Pletal.  Stop Anti-inflammatories (NSAIDS) such as Advil, Aleve, Ibuprofen, Motrin, Naproxen, Naprosyn and Aspirin based products such as Excedrin, Goodys Powder, BC Powder. (May take Tylenol or Acetaminophen if needed.)  Stop ANY OVER THE COUNTER supplements until after surgery. (May continue Vitamin D, Vitamin B, and multivitamin.)  Wear comfortable clothing (specific to your surgery type) to the hospital.  If you are being discharged the day of surgery, you will not be allowed to drive home. You will need a responsible adult to drive you home and stay with you that night.   If you are taking public transportation, you will need to have a responsible adult with you. Please confirm with your physician that it is acceptable to use public transportation.   Please call [phone] if you have any questions about these instructions.

## 2019-06-19 ENCOUNTER — Other Ambulatory Visit: Payer: Self-pay | Admitting: Podiatry

## 2019-06-19 DIAGNOSIS — L97522 Non-pressure chronic ulcer of other part of left foot with fat layer exposed: Secondary | ICD-10-CM | POA: Diagnosis not present

## 2019-06-19 DIAGNOSIS — E1142 Type 2 diabetes mellitus with diabetic polyneuropathy: Secondary | ICD-10-CM | POA: Diagnosis not present

## 2019-06-20 ENCOUNTER — Other Ambulatory Visit: Payer: PPO

## 2019-06-20 ENCOUNTER — Encounter
Admission: RE | Admit: 2019-06-20 | Discharge: 2019-06-20 | Disposition: A | Discharge status: Home / Self Care | Payer: PPO | Source: Ambulatory Visit | Attending: Podiatry | Admitting: Podiatry

## 2019-06-20 ENCOUNTER — Other Ambulatory Visit: Payer: Self-pay

## 2019-06-20 DIAGNOSIS — Z20822 Contact with and (suspected) exposure to covid-19: Secondary | ICD-10-CM | POA: Diagnosis not present

## 2019-06-20 DIAGNOSIS — Z01818 Encounter for other preprocedural examination: Secondary | ICD-10-CM | POA: Diagnosis not present

## 2019-06-20 DIAGNOSIS — I10 Essential (primary) hypertension: Secondary | ICD-10-CM | POA: Diagnosis not present

## 2019-06-20 LAB — CBC
HCT: 36.4 % (ref 36.0–46.0)
Hemoglobin: 11.9 g/dL — ABNORMAL LOW (ref 12.0–15.0)
MCH: 30.5 pg (ref 26.0–34.0)
MCHC: 32.7 g/dL (ref 30.0–36.0)
MCV: 93.3 fL (ref 80.0–100.0)
Platelets: 165 10*3/uL (ref 150–400)
RBC: 3.9 MIL/uL (ref 3.87–5.11)
RDW: 13 % (ref 11.5–15.5)
WBC: 6.1 10*3/uL (ref 4.0–10.5)
nRBC: 0 % (ref 0.0–0.2)

## 2019-06-20 LAB — SARS CORONAVIRUS 2 (TAT 6-24 HRS): SARS Coronavirus 2: NEGATIVE

## 2019-06-21 MED ORDER — CLINDAMYCIN PHOSPHATE 900 MG/50ML IV SOLN: 900.0000 mg | INTRAVENOUS | Status: DC

## 2019-06-22 ENCOUNTER — Encounter
Admission: RE | Disposition: A | Discharge status: Home / Self Care | Payer: Self-pay | Source: Home / Self Care | Attending: Podiatry

## 2019-06-22 ENCOUNTER — Ambulatory Visit
Admission: RE | Admit: 2019-06-22 | Discharge: 2019-06-22 | Disposition: A | Discharge status: Home / Self Care | Payer: PPO | Attending: Podiatry | Admitting: Podiatry

## 2019-06-22 ENCOUNTER — Ambulatory Visit: Payer: PPO | Admitting: Anesthesiology

## 2019-06-22 ENCOUNTER — Other Ambulatory Visit: Payer: Self-pay

## 2019-06-22 ENCOUNTER — Encounter: Payer: Self-pay | Admitting: Podiatry

## 2019-06-22 DIAGNOSIS — L97524 Non-pressure chronic ulcer of other part of left foot with necrosis of bone: Secondary | ICD-10-CM | POA: Diagnosis not present

## 2019-06-22 DIAGNOSIS — N3592 Unspecified urethral stricture, female: Secondary | ICD-10-CM | POA: Insufficient documentation

## 2019-06-22 DIAGNOSIS — K219 Gastro-esophageal reflux disease without esophagitis: Secondary | ICD-10-CM | POA: Insufficient documentation

## 2019-06-22 DIAGNOSIS — Z9049 Acquired absence of other specified parts of digestive tract: Secondary | ICD-10-CM | POA: Diagnosis not present

## 2019-06-22 DIAGNOSIS — L97529 Non-pressure chronic ulcer of other part of left foot with unspecified severity: Secondary | ICD-10-CM | POA: Insufficient documentation

## 2019-06-22 DIAGNOSIS — Z888 Allergy status to other drugs, medicaments and biological substances status: Secondary | ICD-10-CM | POA: Insufficient documentation

## 2019-06-22 DIAGNOSIS — Z880 Allergy status to penicillin: Secondary | ICD-10-CM | POA: Diagnosis not present

## 2019-06-22 DIAGNOSIS — M1990 Unspecified osteoarthritis, unspecified site: Secondary | ICD-10-CM | POA: Diagnosis not present

## 2019-06-22 DIAGNOSIS — N1330 Unspecified hydronephrosis: Secondary | ICD-10-CM | POA: Diagnosis not present

## 2019-06-22 DIAGNOSIS — N183 Chronic kidney disease, stage 3 unspecified: Secondary | ICD-10-CM | POA: Diagnosis not present

## 2019-06-22 DIAGNOSIS — Z90710 Acquired absence of both cervix and uterus: Secondary | ICD-10-CM | POA: Insufficient documentation

## 2019-06-22 DIAGNOSIS — E785 Hyperlipidemia, unspecified: Secondary | ICD-10-CM | POA: Diagnosis not present

## 2019-06-22 DIAGNOSIS — Z96653 Presence of artificial knee joint, bilateral: Secondary | ICD-10-CM | POA: Insufficient documentation

## 2019-06-22 DIAGNOSIS — Z881 Allergy status to other antibiotic agents status: Secondary | ICD-10-CM | POA: Diagnosis not present

## 2019-06-22 DIAGNOSIS — N3941 Urge incontinence: Secondary | ICD-10-CM | POA: Diagnosis not present

## 2019-06-22 DIAGNOSIS — Z86010 Personal history of colonic polyps: Secondary | ICD-10-CM | POA: Insufficient documentation

## 2019-06-22 DIAGNOSIS — Z882 Allergy status to sulfonamides status: Secondary | ICD-10-CM | POA: Diagnosis not present

## 2019-06-22 DIAGNOSIS — Z853 Personal history of malignant neoplasm of breast: Secondary | ICD-10-CM | POA: Diagnosis not present

## 2019-06-22 DIAGNOSIS — I129 Hypertensive chronic kidney disease with stage 1 through stage 4 chronic kidney disease, or unspecified chronic kidney disease: Secondary | ICD-10-CM | POA: Insufficient documentation

## 2019-06-22 DIAGNOSIS — E119 Type 2 diabetes mellitus without complications: Secondary | ICD-10-CM | POA: Diagnosis not present

## 2019-06-22 DIAGNOSIS — Z923 Personal history of irradiation: Secondary | ICD-10-CM | POA: Diagnosis not present

## 2019-06-22 DIAGNOSIS — E1122 Type 2 diabetes mellitus with diabetic chronic kidney disease: Secondary | ICD-10-CM | POA: Diagnosis not present

## 2019-06-22 DIAGNOSIS — Z79899 Other long term (current) drug therapy: Secondary | ICD-10-CM | POA: Insufficient documentation

## 2019-06-22 DIAGNOSIS — Z6829 Body mass index (BMI) 29.0-29.9, adult: Secondary | ICD-10-CM | POA: Diagnosis not present

## 2019-06-22 DIAGNOSIS — E11621 Type 2 diabetes mellitus with foot ulcer: Secondary | ICD-10-CM | POA: Insufficient documentation

## 2019-06-22 DIAGNOSIS — E669 Obesity, unspecified: Secondary | ICD-10-CM | POA: Diagnosis not present

## 2019-06-22 DIAGNOSIS — L97522 Non-pressure chronic ulcer of other part of left foot with fat layer exposed: Secondary | ICD-10-CM | POA: Diagnosis not present

## 2019-06-22 DIAGNOSIS — M8580 Other specified disorders of bone density and structure, unspecified site: Secondary | ICD-10-CM | POA: Insufficient documentation

## 2019-06-22 HISTORY — PX: AMPUTATION TOE: SHX6595

## 2019-06-22 LAB — GLUCOSE, CAPILLARY
Glucose-Capillary: 143 mg/dL — ABNORMAL HIGH (ref 70–99)
Glucose-Capillary: 123 mg/dL — ABNORMAL HIGH (ref 70–99)

## 2019-06-22 IMAGING — US ULTRASOUND RIGHT BREAST LIMITED
1 series · 13 of 14 positions shown · non-contrast
Comparison: Previous exam(s).

CLINICAL DATA: 81-year-old female with history of right lumpectomy
in 7955. The patient had a benign stereotactic guided biopsy of
calcifications in the upper, outer right breast in 1890.

EXAM:
2D DIGITAL DIAGNOSTIC BILATERAL MAMMOGRAM WITH CAD AND ADJUNCT TOMO
ULTRASOUND RIGHT BREAST

[Series 1: ultrasound right breast limited · 0.06mm/px · 13 of 14 slices shown]
[im 1/14]
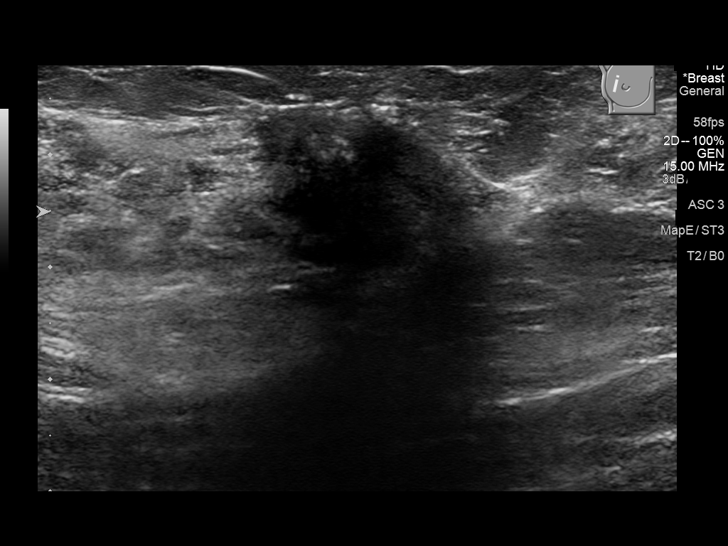
[im 2/14]
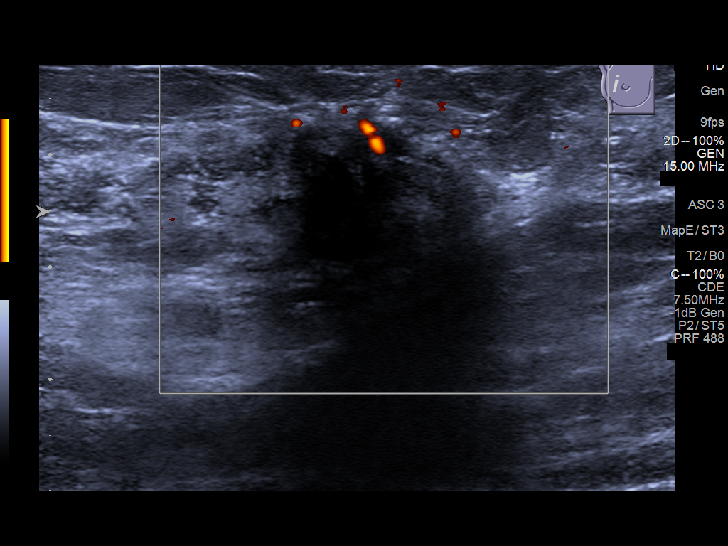
[im 3/14]
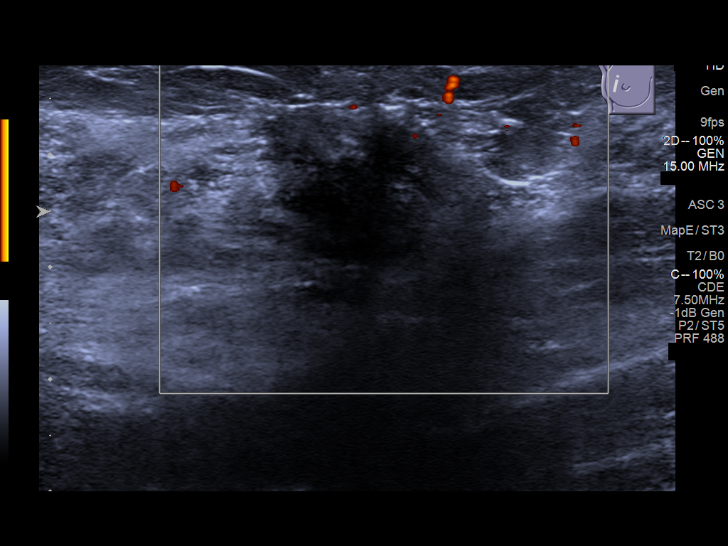
[im 4/14]
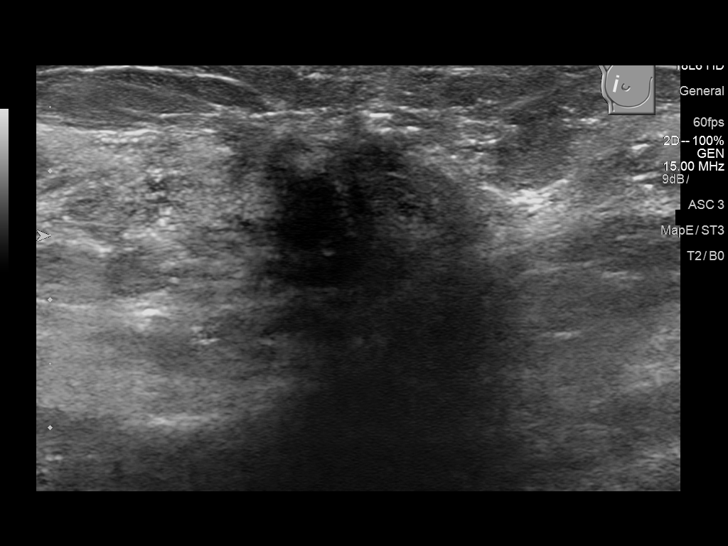
[im 5/14]
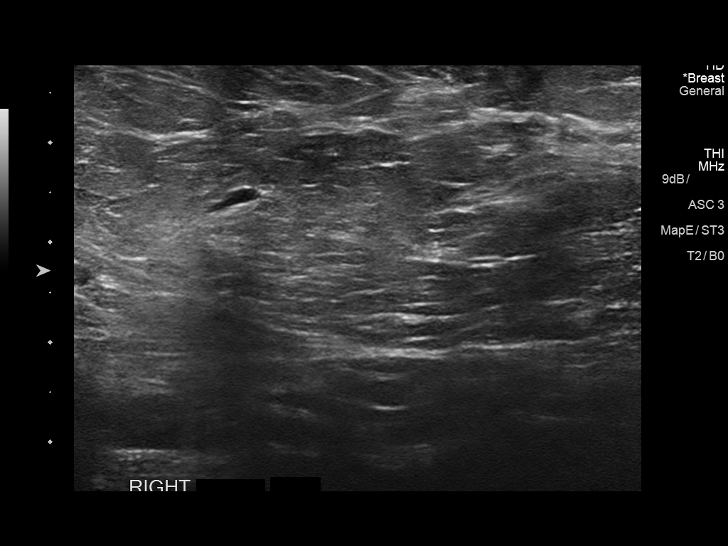
[im 6/14]
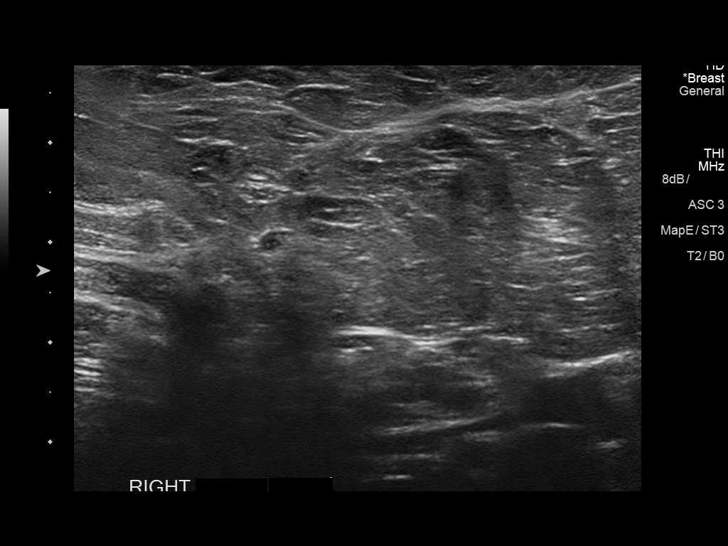
[im 8/14]
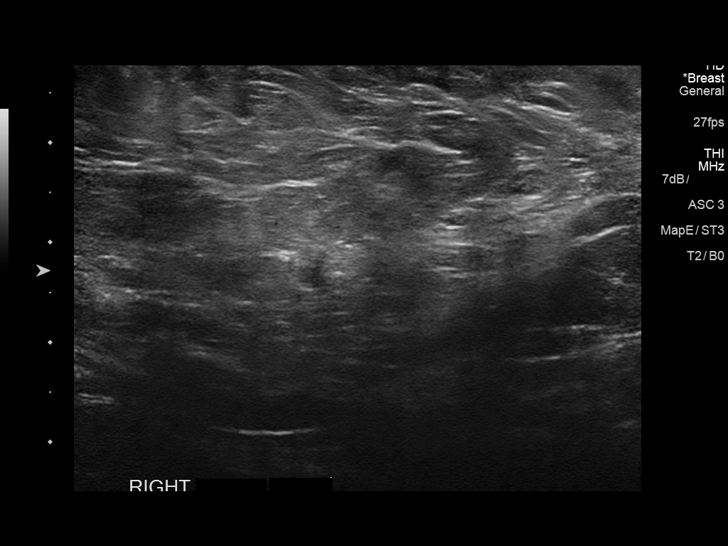
[im 9/14]
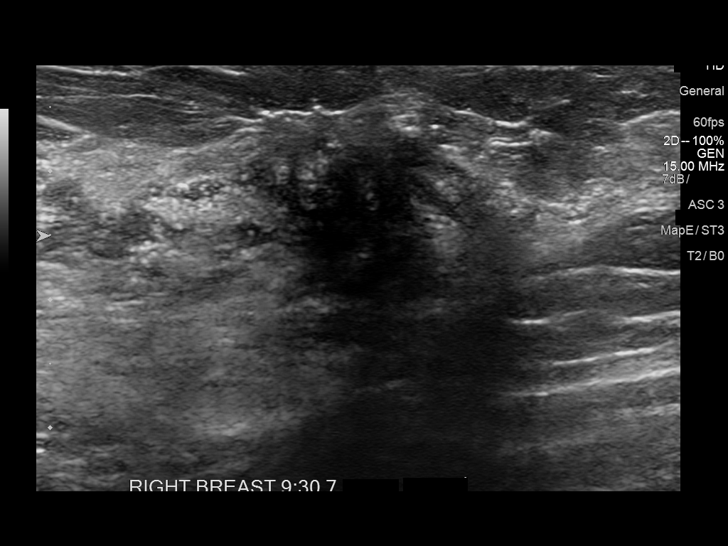
[im 10/14]
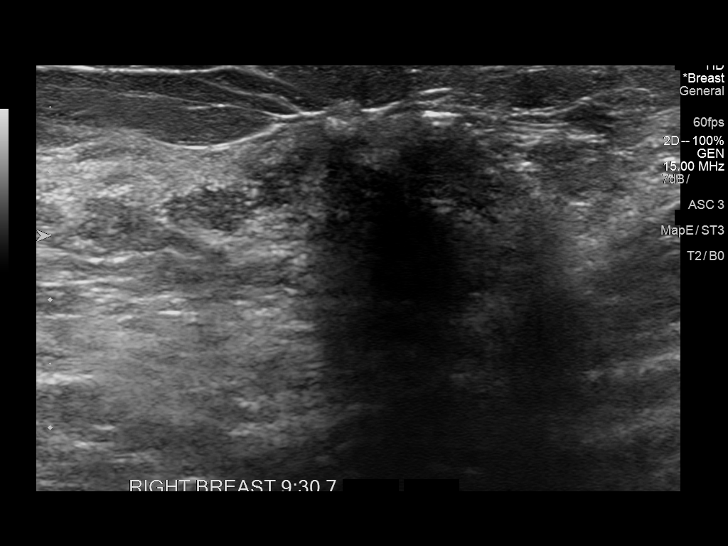
[im 11/14]
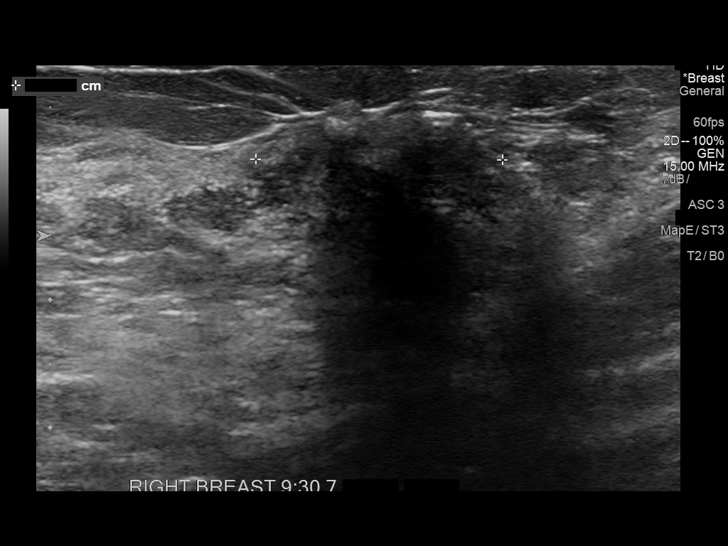
[im 12/14]
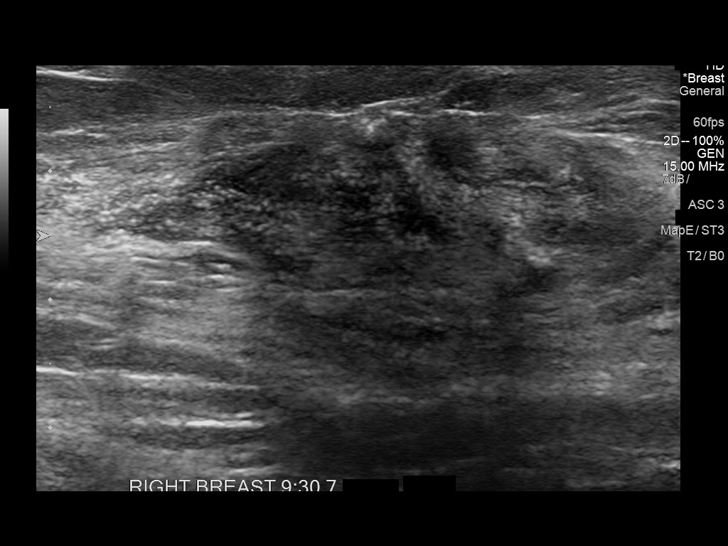
[im 13/14]
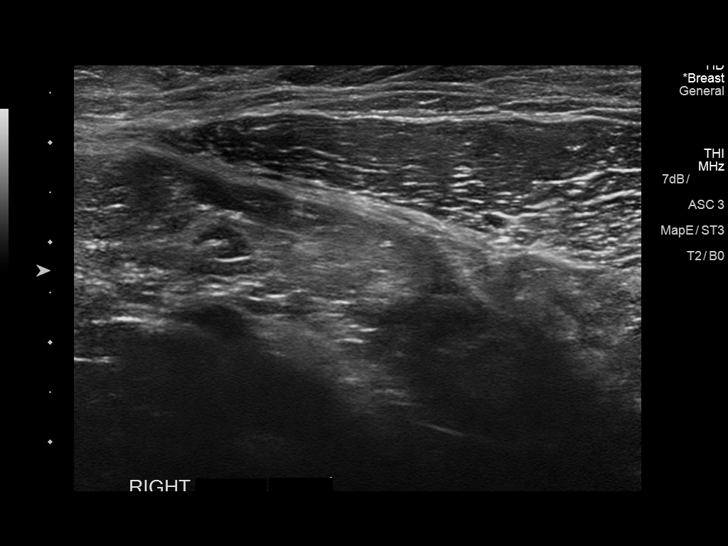
[im 14/14]
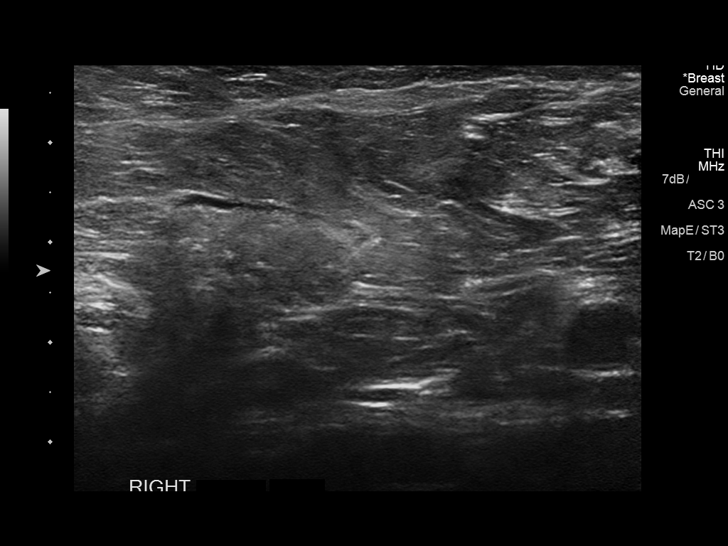

[13 of 14 positions shown; findings below may reference images not displayed]

ACR Breast Density Category c: The breast tissue is heterogeneously
dense, which may obscure small masses.
FINDINGS: Postlumpectomy changes are seen of the posterior upper, outer right
breast. A separate area of distortion is seen within the upper,
outer right breast, middle depth. There are numerous benign
dystrophic calcifications in the adjacent breast tissue. No
suspicious mass, calcifications, or other abnormality is identified
within the left breast.

Mammographic images were processed with CAD.

On physical exam, scarring from the patient's lumpectomy is seen
within the upper, outer right breast. No discrete mass is felt in
the area of concern within the upper, outer right breast.

Targeted ultrasound of the upper, outer right breast was performed
demonstrating an irregular mixed echogenicity area with associated
surrounding distortion and posterior acoustic shadowing at [DATE], 7
cm from the nipple measuring approximately 1.9 x 1.2 x 2.2 cm.
Targeted ultrasound of the right axilla demonstrates no suspicious
appearing axillary lymph nodes.
IMPRESSION: Indeterminate right breast finding at [DATE], 7 cm from the nipple.

RECOMMENDATION:
Ultrasound-guided right breast biopsy. Recommend obtaining the post
biopsy mammogram with tomosynthesis to ensure correlation between
the ultrasound finding and the area of distortion seen
mammographically.

I have discussed the findings and recommendations with the patient.
Results were also provided in writing at the conclusion of the
visit. If applicable, a reminder letter will be sent to the patient
regarding the next appointment.

BI-RADS CATEGORY  4: Suspicious.

## 2019-06-22 SURGERY — AMPUTATION, TOE
Anesthesia: General | Site: Toe | Laterality: Left

## 2019-06-22 MED ORDER — SODIUM CHLORIDE 0.9 % IV SOLN: INTRAVENOUS | Status: DC

## 2019-06-22 MED ORDER — PROPOFOL 500 MG/50ML IV EMUL
INTRAVENOUS | Status: AC
  Filled 2019-06-22: qty 100

## 2019-06-22 MED ORDER — PROPOFOL 10 MG/ML IV BOLUS
INTRAVENOUS | Status: DC | PRN
  Administered 2019-06-22: 20 mg via INTRAVENOUS
  Administered 2019-06-22: 10 mg via INTRAVENOUS

## 2019-06-22 MED ORDER — BUPIVACAINE HCL 0.5 % IJ SOLN
INTRAMUSCULAR | Status: DC | PRN
  Administered 2019-06-22: 3.5 mL

## 2019-06-22 MED ORDER — CLINDAMYCIN PHOSPHATE 900 MG/50ML IV SOLN
INTRAVENOUS | Status: AC
  Filled 2019-06-22: qty 50

## 2019-06-22 MED ORDER — FENTANYL CITRATE (PF) 100 MCG/2ML IJ SOLN
INTRAMUSCULAR | Status: AC
  Filled 2019-06-22: qty 2

## 2019-06-22 MED ORDER — PHENYLEPHRINE HCL (PRESSORS) 10 MG/ML IV SOLN
INTRAVENOUS | Status: AC
  Filled 2019-06-22: qty 1

## 2019-06-22 MED ORDER — ONDANSETRON HCL 4 MG/2ML IJ SOLN: 4.0000 mg | Freq: Once | INTRAMUSCULAR | Status: DC | PRN

## 2019-06-22 MED ORDER — ROCURONIUM BROMIDE 50 MG/5ML IV SOLN
INTRAVENOUS | Status: AC
  Filled 2019-06-22: qty 1

## 2019-06-22 MED ORDER — ONDANSETRON HCL 4 MG PO TABS: 4.0000 mg | ORAL_TABLET | Freq: Four times a day (QID) | ORAL | Status: DC | PRN

## 2019-06-22 MED ORDER — PROPOFOL 10 MG/ML IV BOLUS
INTRAVENOUS | Status: AC
  Filled 2019-06-22: qty 20

## 2019-06-22 MED ORDER — LIDOCAINE HCL (PF) 1 % IJ SOLN
INTRAMUSCULAR | Status: DC | PRN
  Administered 2019-06-22: 3.5 mL

## 2019-06-22 MED ORDER — SUCCINYLCHOLINE CHLORIDE 20 MG/ML IJ SOLN
INTRAMUSCULAR | Status: AC
  Filled 2019-06-22: qty 1

## 2019-06-22 MED ORDER — ONDANSETRON HCL 4 MG/2ML IJ SOLN: 4.0000 mg | Freq: Four times a day (QID) | INTRAMUSCULAR | Status: DC | PRN

## 2019-06-22 MED ORDER — HYDROCODONE-ACETAMINOPHEN 5-325 MG PO TABS: 1.0000 | ORAL_TABLET | Freq: Four times a day (QID) | ORAL | 0 refills | Status: DC | PRN

## 2019-06-22 MED ORDER — POVIDONE-IODINE 7.5 % EX SOLN
Freq: Once | CUTANEOUS | Status: DC
  Filled 2019-06-22: qty 118

## 2019-06-22 MED ORDER — FENTANYL CITRATE (PF) 100 MCG/2ML IJ SOLN: 25.0000 ug | INTRAMUSCULAR | Status: DC | PRN

## 2019-06-22 MED ORDER — LIDOCAINE HCL (PF) 2 % IJ SOLN
INTRAMUSCULAR | Status: AC
  Filled 2019-06-22: qty 10

## 2019-06-22 MED ORDER — EPHEDRINE SULFATE 50 MG/ML IJ SOLN
INTRAMUSCULAR | Status: AC
  Filled 2019-06-22: qty 1

## 2019-06-22 MED ORDER — FAMOTIDINE 20 MG PO TABS
ORAL_TABLET | ORAL | Status: AC
  Administered 2019-06-22: 20 mg via ORAL
  Filled 2019-06-22: qty 1

## 2019-06-22 MED ORDER — ONDANSETRON HCL 4 MG/2ML IJ SOLN
INTRAMUSCULAR | Status: DC | PRN
  Administered 2019-06-22: 4 mg via INTRAVENOUS

## 2019-06-22 MED ORDER — LIDOCAINE HCL (PF) 1 % IJ SOLN
INTRAMUSCULAR | Status: AC
  Filled 2019-06-22: qty 30

## 2019-06-22 MED ORDER — BUPIVACAINE HCL (PF) 0.5 % IJ SOLN
INTRAMUSCULAR | Status: AC
  Filled 2019-06-22: qty 30

## 2019-06-22 MED ORDER — PROPOFOL 500 MG/50ML IV EMUL
INTRAVENOUS | Status: DC | PRN
  Administered 2019-06-22: 75 ug/kg/min via INTRAVENOUS

## 2019-06-22 MED ORDER — FAMOTIDINE 20 MG PO TABS: 20.0000 mg | ORAL_TABLET | Freq: Once | ORAL | Status: AC

## 2019-06-22 SURGICAL SUPPLY — 49 items
BLADE OSC/SAGITTAL MD 5.5X18 (BLADE) ×3 IMPLANT
BLADE SURG MINI STRL (BLADE) ×2 IMPLANT
BNDG CMPR STD VLCR NS LF 5.8X4 (GAUZE/BANDAGES/DRESSINGS) ×1
BNDG CONFORM 2 STRL LF (GAUZE/BANDAGES/DRESSINGS) ×2 IMPLANT
BNDG CONFORM 3 STRL LF (GAUZE/BANDAGES/DRESSINGS) ×4 IMPLANT
BNDG ELASTIC 4X5.8 VLCR NS LF (GAUZE/BANDAGES/DRESSINGS) ×2 IMPLANT
BNDG ELASTIC 4X5.8 VLCR STR LF (GAUZE/BANDAGES/DRESSINGS) ×1 IMPLANT
BNDG ESMARK 4X12 TAN STRL LF (GAUZE/BANDAGES/DRESSINGS) ×2 IMPLANT
BNDG GAUZE 4.5X4.1 6PLY STRL (MISCELLANEOUS) ×2 IMPLANT
CANISTER SUCT 1200ML W/VALVE (MISCELLANEOUS) ×2 IMPLANT
COVER WAND RF STERILE (DRAPES) ×2 IMPLANT
CUFF TOURN SGL QUICK 12 (TOURNIQUET CUFF) IMPLANT
CUFF TOURN SGL QUICK 18X4 (TOURNIQUET CUFF) ×1 IMPLANT
DRAPE FLUOR MINI C-ARM 54X84 (DRAPES) ×2 IMPLANT
DRAPE XRAY CASSETTE 23X24 (DRAPES) ×1 IMPLANT
DURAPREP 26ML APPLICATOR (WOUND CARE) ×2 IMPLANT
ELECT REM PT RETURN 9FT ADLT (ELECTROSURGICAL) ×2
ELECTRODE REM PT RTRN 9FT ADLT (ELECTROSURGICAL) ×1 IMPLANT
GAUZE PACKING IODOFORM 1/2 (PACKING) ×2 IMPLANT
GAUZE SPONGE 4X4 12PLY STRL (GAUZE/BANDAGES/DRESSINGS) ×2 IMPLANT
GAUZE XEROFORM 1X8 LF (GAUZE/BANDAGES/DRESSINGS) ×2 IMPLANT
GAUZE XEROFORM 4X4 STRL (GAUZE/BANDAGES/DRESSINGS) ×1 IMPLANT
GLOVE BIO SURGEON STRL SZ7.5 (GLOVE) ×2 IMPLANT
GLOVE INDICATOR 8.0 STRL GRN (GLOVE) ×2 IMPLANT
GOWN STRL REUS W/ TWL LRG LVL3 (GOWN DISPOSABLE) ×2 IMPLANT
GOWN STRL REUS W/TWL LRG LVL3 (GOWN DISPOSABLE) ×4
KIT TURNOVER KIT A (KITS) ×2 IMPLANT
LABEL OR SOLS (LABEL) ×2 IMPLANT
NDL FILTER BLUNT 18X1 1/2 (NEEDLE) ×1 IMPLANT
NDL HYPO 25X1 1.5 SAFETY (NEEDLE) ×1 IMPLANT
NEEDLE FILTER BLUNT 18X 1/2SAF (NEEDLE) ×1
NEEDLE FILTER BLUNT 18X1 1/2 (NEEDLE) ×1 IMPLANT
NEEDLE HYPO 25X1 1.5 SAFETY (NEEDLE) ×2 IMPLANT
NS IRRIG 500ML POUR BTL (IV SOLUTION) ×2 IMPLANT
PACK EXTREMITY ARMC (MISCELLANEOUS) ×2 IMPLANT
PAD ABD DERMACEA PRESS 5X9 (GAUZE/BANDAGES/DRESSINGS) ×4 IMPLANT
PULSAVAC PLUS IRRIG FAN TIP (DISPOSABLE)
SHIELD FULL FACE ANTIFOG 7M (MISCELLANEOUS) ×2 IMPLANT
SOL .9 NS 3000ML IRR  AL (IV SOLUTION) ×1
SOL .9 NS 3000ML IRR AL (IV SOLUTION) ×1
SOL .9 NS 3000ML IRR UROMATIC (IV SOLUTION) ×1 IMPLANT
STOCKINETTE M/LG 89821 (MISCELLANEOUS) ×2 IMPLANT
STRAP SAFETY 5IN WIDE (MISCELLANEOUS) ×2 IMPLANT
SUT ETHILON 3-0 FS-10 30 BLK (SUTURE) ×2
SUT ETHILON 5-0 FS-2 18 BLK (SUTURE) ×2 IMPLANT
SUT VIC AB 4-0 FS2 27 (SUTURE) ×2 IMPLANT
SUTURE EHLN 3-0 FS-10 30 BLK (SUTURE) ×1 IMPLANT
SYR 10ML LL (SYRINGE) ×6 IMPLANT
TIP FAN IRRIG PULSAVAC PLUS (DISPOSABLE) ×1 IMPLANT

## 2019-06-22 NOTE — Anesthesia Postprocedure Evaluation (Signed)
Anesthesia Post Note  Patient: Danielle Nixon  Procedure(s) Performed: AMPUTATION TOE MPJ 3RD LEFT (Left Toe)  Anesthesia Post Evaluation   Last Vitals:  Vitals:   06/22/19 0928 06/22/19 1002  BP: 122/62 (!) 109/57  Pulse: 84 79  Resp: 16 16  Temp: (!) 36.3 C   SpO2: 93% 94%    Last Pain:  Vitals:   06/22/19 1002  TempSrc:   PainSc: 0-No pain                 Tamikka Pilger

## 2019-06-22 NOTE — Discharge Instructions (Signed)
[facility]  POST OPERATIVE INSTRUCTIONS FOR DR. TROXLER, DR. Rieley Khalsa, AND [facility]. [facility] PODIAT[facility]   1. Take your medication as prescribed.  Pain medication should be taken only as needed.  2. Keep the dressing clean, dry and intact.  3. Keep your foot elevated above the heart level for the first 48 hours.  4. Walking to the bathroom and brief periods of walking are acceptable, unless we have instructed you to be non-weight bearing.  5. Always wear your post-op shoe when walking.  Always use your crutches if you are to be non-weight bearing.  6. Do not take a shower. Baths are permissible as long as the foot is kept out of the water.   7. Every hour you are awake:  - Bend your knee 15 times. - Flex foot 15 times - Massage calf 15 times  8. Call [facility] ([phone]) if any of the following problems occur: - You develop a temperature or fever. - The bandage becomes saturated with blood. - Medication does not stop your pain. - Injury of the foot occurs. - Any symptoms of infection including redness, odor, or red streaks running from wound.  AMBULATORY SURGERY  DISCHARGE INSTRUCTIONS  1) The drugs that you were given will stay in your system until tomorrow so for the next 24 hours you should not: A) Drive an automobile B) Make any legal decisions C) Drink any alcoholic beverage  2) You may resume regular meals tomorrow.  Today it is better to start with liquids and gradually work up to solid foods. You may eat anything you prefer, but it is better to start with liquids, then soup and crackers, and gradually work up to solid foods.  3) Please notify your doctor immediately if you have any unusual bleeding, trouble breathing, redness and pain at the surgery site, drainage, fever, or pain not relieved by medication.  4) Additional Instructions:   Please contact your physician with any problems or Same  Day Surgery at [phone], Monday through Friday 6 am to 4 pm, or [facility] [place] [facility] number at [phone]. 

## 2019-06-22 NOTE — Op Note (Signed)
Operative note   Surgeon:Lita Flynn Vickki Muff    Assistant: Jettie Booze PA student    Preop diagnosis: Full-thickness ulcer plantar left third toe    Postop diagnosis: Same    Procedure: Amputation left third toe    EBL: Minimal    Anesthesia:local and IV sedation.  Local consisted of a one-to-one mixture of 0.5% bupivacaine and 1% lidocaine plain.  A total of 7 cc was used in a local digital block fashion    Hemostasis: None    Specimen: Amputated left third toe with ulceration    Complications: None    Operative indications:Danielle Nixon is an 84 y.o. that presents today for surgical intervention.  The risks/benefits/alternatives/complications have been discussed and consent has been given.    Procedure:  Patient was brought into the OR and placed on the operating table in thesupine position. After anesthesia was obtained theleft lower extremity was prepped and draped in usual sterile fashion.  Attention was directed to the left third toe where a fishmouth incision was planned.  Full-thickness flaps were created just distal to the metatarsophalangeal joint and proximal to the ulceration at the PIPJ.  Next a osteotomy was created through and through at the base of the proximal phalanx.  The toe was then removed from the surgical field in toto.  The wound was flushed with copious amounts of irrigation.  Bleeders were Bovie cauterized.  Closure was performed with a 3-0 nylon in a simple interrupted fashion.  Bulky sterile dressing was then applied.    Patient tolerated the procedure and anesthesia well.  Was transported from the OR to the PACU with all vital signs stable and vascular status intact. To be discharged per routine protocol.  Will follow up in approximately 1 week in the outpatient clinic.

## 2019-06-22 NOTE — H&P (Signed)
HISTORY AND PHYSICAL INTERVAL NOTE:  06/22/2019  7:29 AM  Danielle Nixon  has presented today for surgery, with the diagnosis of E11.42 TYPE 2 DIABETES L97.522 ULCER LEFT FOOT.  The various methods of treatment have been discussed with the patient.  No guarantees were given.  After consideration of risks, benefits and other options for treatment, the patient has consented to surgery.  I have reviewed the patients' chart and labs.     A history and physical examination was performed in my office.  The patient was reexamined.  There have been no changes to this history and physical examination.  Danielle Nixon A

## 2019-06-22 NOTE — Anesthesia Preprocedure Evaluation (Signed)
Anesthesia Evaluation  Patient identified by MRN, date of birth, ID band Patient awake    Reviewed: Allergy & Precautions, NPO status , Patient's Chart, lab work & pertinent test results  History of Anesthesia Complications Negative for: history of anesthetic complications  Airway Mallampati: II  TM Distance: >3 FB Neck ROM: Full    Dental  (+) Implants   Pulmonary neg pulmonary ROS, neg sleep apnea, neg COPD,    breath sounds clear to auscultation- rhonchi (-) wheezing      Cardiovascular hypertension, Pt. on medications (-) CAD, (-) Past MI and (-) Cardiac Stents  Rhythm:Regular Rate:Normal - Systolic murmurs and - Diastolic murmurs    Neuro/Psych negative neurological ROS  negative psych ROS   GI/Hepatic Neg liver ROS, GERD  ,  Endo/Other  diabetes  Renal/GU Renal InsufficiencyRenal disease     Musculoskeletal  (+) Arthritis ,   Abdominal (+) + obese,   Peds  Hematology negative hematology ROS (+)   Anesthesia Other Findings Past Medical History: No date: Arthritis     Comment:  hands No date: Atrophic vaginitis 2011: Breast cancer ([place])     Comment:  RT LUMPECTOMY W/RADIATION No date: DM (diabetes mellitus) ([place])     Comment:  diet controlled No date: Flank pain No date: GERD (gastroesophageal reflux disease) No date: Hematuria, microscopic No date: Hydronephrosis No date: Hypertension No date: Over weight No date: Personal history of radiation therapy No date: Urethral stricture No date: Urge incontinence No date: Vaginal yeast infection   Reproductive/Obstetrics                             Anesthesia Physical  Anesthesia Plan  ASA: II  Anesthesia Plan: General   Post-op Pain Management:    Induction: Intravenous  PONV Risk Score and Plan: 2 and TIVA  Airway Management Planned: Nasal Cannula  Additional Equipment:   Intra-op Plan:   Post-operative Plan:    Informed Consent: I have reviewed the patients History and Physical, chart, labs and discussed the procedure including the risks, benefits and alternatives for the proposed anesthesia with the patient or authorized representative who has indicated his/her understanding and acceptance.     Dental advisory given  Plan Discussed with: CRNA and Anesthesiologist  Anesthesia Plan Comments:         Anesthesia Quick Evaluation

## 2019-06-22 NOTE — Transfer of Care (Signed)
Immediate Anesthesia Transfer of Care Note  Patient: Danielle Nixon  Procedure(s) Performed: AMPUTATION TOE MPJ 3RD LEFT (Left Toe)  Patient Location: PACU  Anesthesia Type:General  Level of Consciousness: sedated  Airway & Oxygen Therapy: Patient Spontanous Breathing and Patient connected to face mask oxygen  Post-op Assessment: Report given to RN and Post -op Vital signs reviewed and stable  Post vital signs: Reviewed and stable  Last Vitals:  Vitals Value Taken Time  BP 100/65 06/22/19 0831  Temp    Pulse 89 06/22/19 0832  Resp 18 06/22/19 0832  SpO2 100 % 06/22/19 0832  Vitals shown include unvalidated device data.  Last Pain:  Vitals:   06/22/19 0628  TempSrc: Temporal  PainSc: 0-No pain      Patients Stated Pain Goal: 0 ([id number])  Complications: No apparent anesthesia complications

## 2019-06-25 LAB — SURGICAL PATHOLOGY

## 2019-06-28 DIAGNOSIS — L97522 Non-pressure chronic ulcer of other part of left foot with fat layer exposed: Secondary | ICD-10-CM | POA: Diagnosis not present

## 2019-07-06 ENCOUNTER — Emergency Department: Payer: PPO

## 2019-07-06 ENCOUNTER — Other Ambulatory Visit: Payer: Self-pay

## 2019-07-06 ENCOUNTER — Emergency Department
Admission: EM | Admit: 2019-07-06 | Discharge: 2019-07-06 | Disposition: A | Discharge status: Home / Self Care | Payer: PPO | Attending: Emergency Medicine | Admitting: Emergency Medicine

## 2019-07-06 DIAGNOSIS — Y92018 Other place in single-family (private) house as the place of occurrence of the external cause: Secondary | ICD-10-CM | POA: Diagnosis not present

## 2019-07-06 DIAGNOSIS — S0191XA Laceration without foreign body of unspecified part of head, initial encounter: Secondary | ICD-10-CM | POA: Diagnosis not present

## 2019-07-06 DIAGNOSIS — Y998 Other external cause status: Secondary | ICD-10-CM | POA: Insufficient documentation

## 2019-07-06 DIAGNOSIS — Z79899 Other long term (current) drug therapy: Secondary | ICD-10-CM | POA: Insufficient documentation

## 2019-07-06 DIAGNOSIS — W01198A Fall on same level from slipping, tripping and stumbling with subsequent striking against other object, initial encounter: Secondary | ICD-10-CM | POA: Insufficient documentation

## 2019-07-06 DIAGNOSIS — R0902 Hypoxemia: Secondary | ICD-10-CM | POA: Diagnosis not present

## 2019-07-06 DIAGNOSIS — E119 Type 2 diabetes mellitus without complications: Secondary | ICD-10-CM | POA: Diagnosis not present

## 2019-07-06 DIAGNOSIS — S0101XA Laceration without foreign body of scalp, initial encounter: Secondary | ICD-10-CM | POA: Insufficient documentation

## 2019-07-06 DIAGNOSIS — S7001XA Contusion of right hip, initial encounter: Secondary | ICD-10-CM

## 2019-07-06 DIAGNOSIS — I10 Essential (primary) hypertension: Secondary | ICD-10-CM | POA: Insufficient documentation

## 2019-07-06 DIAGNOSIS — Y9301 Activity, walking, marching and hiking: Secondary | ICD-10-CM | POA: Insufficient documentation

## 2019-07-06 DIAGNOSIS — S199XXA Unspecified injury of neck, initial encounter: Secondary | ICD-10-CM | POA: Diagnosis not present

## 2019-07-06 DIAGNOSIS — Z23 Encounter for immunization: Secondary | ICD-10-CM | POA: Insufficient documentation

## 2019-07-06 DIAGNOSIS — W19XXXA Unspecified fall, initial encounter: Secondary | ICD-10-CM

## 2019-07-06 DIAGNOSIS — Z853 Personal history of malignant neoplasm of breast: Secondary | ICD-10-CM | POA: Insufficient documentation

## 2019-07-06 DIAGNOSIS — S0990XA Unspecified injury of head, initial encounter: Secondary | ICD-10-CM | POA: Diagnosis present

## 2019-07-06 DIAGNOSIS — R52 Pain, unspecified: Secondary | ICD-10-CM | POA: Diagnosis not present

## 2019-07-06 DIAGNOSIS — R58 Hemorrhage, not elsewhere classified: Secondary | ICD-10-CM | POA: Diagnosis not present

## 2019-07-06 DIAGNOSIS — Z923 Personal history of irradiation: Secondary | ICD-10-CM | POA: Insufficient documentation

## 2019-07-06 MED ORDER — TRAMADOL HCL 50 MG PO TABS: 50.0000 mg | ORAL_TABLET | Freq: Four times a day (QID) | ORAL | 0 refills | Status: DC | PRN

## 2019-07-06 MED ORDER — TETANUS-DIPHTH-ACELL PERTUSSIS 5-2.5-18.5 LF-MCG/0.5 IM SUSP
0.5000 mL | Freq: Once | INTRAMUSCULAR | Status: AC
  Administered 2019-07-06: 22:00:00 0.5 mL via INTRAMUSCULAR
  Filled 2019-07-06: qty 0.5

## 2019-07-06 NOTE — ED Notes (Signed)
This RN attempted [phone] to call for pt's ride. Pt's ride is on the way at this time.

## 2019-07-06 NOTE — ED Triage Notes (Addendum)
FIRST NURSE NOTE- pt tripped and fell and hit head on railing. Wound to top of head with bleeding controlled at this time. No blood thinners, LOC or vomiting. VSS with EMS

## 2019-07-06 NOTE — ED Provider Notes (Signed)
[facility] Emergency Department Provider Note  ____________________________________________  Time seen: Approximately 7:48 PM  I have reviewed the triage vital signs and the nursing notes.   HISTORY  Chief Complaint Fall and Laceration    HPI Danielle Nixon is a 84 y.o. female who presents the emergency department for evaluation after a fall.  Patient tripped coming out of her front door, she fell striking her head against a right iron railing.  She denied any loss of consciousness prior to or after the fall.  Patient sustained a laceration to her scalp.  She is currently having a headache, right hip pain.  Patient states that she has born weight on her right hip without difficulty but bearing weight does increase the pain.  No lower back pain.  Patient denies any visual changes, chest pain, shortness of breath.  No radicular symptoms.  No bowel or bladder dysfunction, saddle anesthesia or paresthesias.  Patient has a history as described below which includes diabetes, GERD, hypertension breast cancer.  No complaints of chronic medical issues.         Past Medical History:  Diagnosis Date  . Arthritis    hands  . Atrophic vaginitis   . Breast cancer ([place]) 2011   RT LUMPECTOMY W/RADIATION  . DM (diabetes mellitus) ([place])    diet controlled  . Flank pain   . GERD (gastroesophageal reflux disease)   . Hematuria, microscopic   . Hydronephrosis   . Hypertension   . Over weight   . Personal history of radiation therapy   . Urethral stricture   . Urge incontinence   . Vaginal yeast infection     Patient Active Problem List   Diagnosis Date Noted  . Carcinoma of overlapping sites of right breast in female, estrogen receptor positive ([place]) 10/06/2016  . Urge incontinence 06/17/2015  . Atrophic vaginitis 06/17/2015  . H/O malignant neoplasm of breast 07/18/2013    Past Surgical History:  Procedure Laterality Date  . ABDOMINAL HYSTERECTOMY    .  AMPUTATION TOE Left 06/22/2019   Procedure: AMPUTATION TOE MPJ 3RD LEFT;  Surgeon: Samara Deist, DPM;  Location: [facility] ORS;  Service: Podiatry;  Laterality: Left;  . APPENDECTOMY    . [place] flap ureteroscopy    . BREAST BIOPSY Right 2011   +  . BREAST BIOPSY Right 01/05/2017   path pending  . BREAST EXCISIONAL BIOPSY Right 03/15/2017  . CATARACT EXTRACTION, BILATERAL    . CHOLECYSTECTOMY    . FOOT SURGERY Right   . GALLBLADDER SURGERY    . PARTIAL MASTECTOMY WITH NEEDLE LOCALIZATION Right 03/15/2017   Procedure: PARTIAL MASTECTOMY WITH NEEDLE LOCALIZATION;  Surgeon: Leonie Green, MD;  Location: [facility] ORS;  Service: General;  Laterality: Right;  . TONSILLECTOMY      Prior to Admission medications   Medication Sig Start Date End Date Taking? Authorizing Provider  amitriptyline (ELAVIL) 50 MG tablet Take 50 mg by mouth at bedtime.  09/23/14   [provider]  atorvastatin (LIPITOR) 10 MG tablet Take 10 mg by mouth every evening.  09/23/14   [provider]  clindamycin (CLEOCIN) 150 MG capsule Take 600 mg by mouth See admin instructions. FOR DENTAL PROCEDURES 12/07/16   [provider]  gabapentin (NEURONTIN) 300 MG capsule Take 600 mg by mouth 3 (three) times daily.  12/11/14   [provider]  HYDROcodone-acetaminophen (NORCO) 5-325 MG tablet Take 1 tablet by mouth every 6 (six) hours as needed for moderate pain. 06/22/19  Samara Deist, DPM  ibuprofen (ADVIL,MOTRIN) 200 MG tablet Take 200 mg by mouth every 8 (eight) hours as needed for mild pain.    [provider]  naproxen sodium (ANAPROX) 220 MG tablet Take 220 mg by mouth daily as needed (PAIN).    [provider]  traMADol (ULTRAM) 50 MG tablet Take 1 tablet (50 mg total) by mouth every 6 (six) hours as needed. 07/06/19   Kaled Allende, Charline Bills, PA-C  valsartan-hydrochlorothiazide (DIOVAN-HCT) 80-12.5 MG tablet Take 1 tablet by mouth daily. 04/02/19   [provider]     Allergies Ciprofloxacin, Doxycycline, Nitrofurantoin monohyd macro, Penicillins, Pseudoephedrine hcl, Sulfa antibiotics, and Terazosin  Family History  Problem Relation Age of Onset  . Diabetes Other   . Heart disease Other   . Breast cancer Cousin   . Kidney disease Neg Hx   . Bladder Cancer Neg Hx     Social History Social History   Tobacco Use  . Smoking status: Never Smoker  . Smokeless tobacco: Never Used  Substance Use Topics  . Alcohol use: No    Alcohol/week: 0.0 standard drinks  . Drug use: No     Review of Systems  Constitutional: No fever/chills.  Positive for fall Eyes: No visual changes. No discharge ENT: No upper respiratory complaints. Cardiovascular: no chest pain. Respiratory: no cough. No SOB. Gastrointestinal: No abdominal pain.  No nausea, no vomiting.   Musculoskeletal: Positive for right hip pain Skin: Positive for scalp laceration Neurological:  positive for headache but denies focal weakness or numbness. 10-point ROS otherwise negative.  ____________________________________________   PHYSICAL EXAM:  VITAL SIGNS: ED Triage Vitals  Enc Vitals Group     BP 07/06/19 1846 108/65     Pulse Rate 07/06/19 1846 (!) 112     Resp 07/06/19 1846 16     Temp 07/06/19 1846 98 F (36.7 C)     Temp Source 07/06/19 1846 Oral     SpO2 07/06/19 1846 93 %     Weight 07/06/19 1847 200 lb (90.7 kg)     Height 07/06/19 1847 5\' 9"  (1.753 m)     Head Circumference --      Peak Flow --      Pain Score 07/06/19 1846 7     Pain Loc --      Pain Edu? --      Excl. in [place]? --      Constitutional: Alert and oriented. Well appearing and in no acute distress. Eyes: Conjunctivae are normal. PERRL. EOMI. Head: Patient has an approximately 2 cm laceration along the left parietal scalp.  Relatively superficial in nature.  Edges are not gaped open at this time.  No active bleeding.  No visible foreign body.  Patient is tender to palpation over the laceration but  no surrounding tenderness over the osseous structures of the skull.  No palpable abnormality or crepitus.  No battle signs, raccoon eyes, serosanguineous fluid drainage from the ears or nares. ENT:      Ears:       Nose: No congestion/rhinnorhea.      Mouth/Throat: Mucous membranes are moist.  Neck: No stridor.  No cervical spine tenderness to palpation.  Cardiovascular: Normal rate, regular rhythm. Normal S1 and S2.  Good peripheral circulation. Respiratory: Normal respiratory effort without tachypnea or retractions. Lungs CTAB. Good air entry to the bases with no decreased or absent breath sounds. Musculoskeletal: Full range of motion to all extremities. No gross deformities appreciated.  No visualized shortening  or rotation of the right lower extremity.  Patient is diffusely tender to palpation over the lateral aspect of the hip with no palpable abnormalities.  No deficits.  Results pedis pulse and sensation intact distally. Neurologic:  Normal speech and language. No gross focal neurologic deficits are appreciated.  Cranial nerves II through XII grossly intact. Skin:  Skin is warm, dry and intact. No rash noted. Psychiatric: Mood and affect are normal. Speech and behavior are normal. Patient exhibits appropriate insight and judgement.   ____________________________________________   LABS (all labs ordered are listed, but only abnormal results are displayed)  Labs Reviewed - No data to display ____________________________________________  EKG   ____________________________________________  RADIOLOGY I personally viewed and evaluated these images as part of my medical decision making, as well as reviewing the written report by the radiologist.  CT Head Wo Contrast  Result Date: 07/06/2019 CLINICAL DATA:  Post-traumatic headache. Laceration to the top of the head. EXAM: CT HEAD WITHOUT CONTRAST CT CERVICAL SPINE WITHOUT CONTRAST TECHNIQUE: Multidetector CT imaging of the head and  cervical spine was performed following the standard protocol without intravenous contrast. Multiplanar CT image reconstructions of the cervical spine were also generated. COMPARISON:  None. FINDINGS: CT HEAD FINDINGS Brain: No evidence of acute infarction, hemorrhage, hydrocephalus, extra-axial collection or mass lesion/mass effect. Atrophy and chronic microvascular ischemic changes are noted. Vascular: No hyperdense vessel or unexpected calcification. Skull: There is posterior scalp swelling without evidence for an underlying calvarial fracture or radiopaque foreign body. Sinuses/Orbits: There is near complete opacification of the right maxillary sinus, likely chronic. There is some mucosal thickening of the sphenoid sinuses bilaterally. The frontal sinuses are essentially clear. The mastoid air cells are essentially clear. Other: None. CT CERVICAL SPINE FINDINGS Alignment: There is straightening of the normal cervical lordotic curvature. Skull base and vertebrae: No acute fracture. No primary bone lesion or focal pathologic process. Soft tissues and spinal canal: No prevertebral fluid or swelling. No visible canal hematoma. Disc levels: Multilevel disc height loss is noted throughout the cervical spine. Bilateral osseous neural foraminal narrowing is noted at multiple levels. Upper chest: Negative. Other: None IMPRESSION: 1. No evidence of acute intracranial pathology. 2. Posterior scalp swelling without evidence of an underlying calvarial fracture or radiopaque foreign body. 3. Near complete opacification of the right maxillary sinus, likely chronic. 4. No evidence of acute fracture or subluxation of the cervical spine. 5. Multilevel degenerative disc disease of the cervical spine. Electronically Signed   By: Constance Holster M.D.   On: 07/06/2019 21:30   CT Cervical Spine Wo Contrast  Result Date: 07/06/2019 CLINICAL DATA:  Post-traumatic headache. Laceration to the top of the head. EXAM: CT HEAD WITHOUT  CONTRAST CT CERVICAL SPINE WITHOUT CONTRAST TECHNIQUE: Multidetector CT imaging of the head and cervical spine was performed following the standard protocol without intravenous contrast. Multiplanar CT image reconstructions of the cervical spine were also generated. COMPARISON:  None. FINDINGS: CT HEAD FINDINGS Brain: No evidence of acute infarction, hemorrhage, hydrocephalus, extra-axial collection or mass lesion/mass effect. Atrophy and chronic microvascular ischemic changes are noted. Vascular: No hyperdense vessel or unexpected calcification. Skull: There is posterior scalp swelling without evidence for an underlying calvarial fracture or radiopaque foreign body. Sinuses/Orbits: There is near complete opacification of the right maxillary sinus, likely chronic. There is some mucosal thickening of the sphenoid sinuses bilaterally. The frontal sinuses are essentially clear. The mastoid air cells are essentially clear. Other: None. CT CERVICAL SPINE FINDINGS Alignment: There is straightening of the normal  cervical lordotic curvature. Skull base and vertebrae: No acute fracture. No primary bone lesion or focal pathologic process. Soft tissues and spinal canal: No prevertebral fluid or swelling. No visible canal hematoma. Disc levels: Multilevel disc height loss is noted throughout the cervical spine. Bilateral osseous neural foraminal narrowing is noted at multiple levels. Upper chest: Negative. Other: None IMPRESSION: 1. No evidence of acute intracranial pathology. 2. Posterior scalp swelling without evidence of an underlying calvarial fracture or radiopaque foreign body. 3. Near complete opacification of the right maxillary sinus, likely chronic. 4. No evidence of acute fracture or subluxation of the cervical spine. 5. Multilevel degenerative disc disease of the cervical spine. Electronically Signed   By: Constance Holster M.D.   On: 07/06/2019 21:30   DG Hip Unilat W or Wo Pelvis 2-3 Views Right  Result Date:  07/06/2019 CLINICAL DATA:  Patient missed a step causing her to fall against an iron railing. Complaining of right-sided and right hip pain. EXAM: DG HIP (WITH OR WITHOUT PELVIS) 2-3V RIGHT COMPARISON:  None. FINDINGS: No fracture or bone lesion. Hip joints normally aligned. Mild superolateral narrowing of the right hip joint. No other degenerative/arthropathic change. Increased attenuation noted in the subcutaneous soft tissues lateral to the right proximal femur consistent with contusion. IMPRESSION: 1. No fracture or dislocation. 2. Soft tissue contusion lateral to the proximal right femur/hip. Electronically Signed   By: Lajean Manes M.D.   On: 07/06/2019 20:58    ____________________________________________    PROCEDURES  Procedure(s) performed:    Marland KitchenMarland KitchenLaceration Repair  Date/Time: 07/06/2019 9:08 PM Performed by: Darletta Moll, PA-C Authorized by: Darletta Moll, PA-C   Consent:    Consent obtained:  Verbal   Consent given by:  Patient   Risks discussed:  Pain Anesthesia (see MAR for exact dosages):    Anesthesia method:  None Laceration details:    Location:  Scalp   Scalp location:  L parietal   Length (cm):  2 Repair type:    Repair type:  Simple Exploration:    Hemostasis achieved with:  Direct pressure   Wound exploration: wound explored through full range of motion and entire depth of wound probed and visualized     Wound extent: no foreign bodies/material noted, no muscle damage noted, no nerve damage noted, no tendon damage noted, no underlying fracture noted and no vascular damage noted     Contaminated: no   Treatment:    Area cleansed with:  Saline   Amount of cleaning:  Standard   Irrigation solution:  Sterile saline   Irrigation volume:  1L   Irrigation method:  Syringe Skin repair:    Repair method:  Staples   Number of staples:  2 Approximation:    Approximation:  Close Post-procedure details:    Dressing:  Open (no dressing)   Patient  tolerance of procedure:  Tolerated well, no immediate complications      Medications  Tdap (BOOSTRIX) injection 0.5 mL (has no administration in time range)     ____________________________________________   INITIAL IMPRESSION / ASSESSMENT AND PLAN / ED COURSE  Pertinent labs & imaging results that were available during my care of the patient were reviewed by me and considered in my medical decision making (see chart for details).  Review of the [place] CSRS was performed in accordance of the [place] prior to dispensing any controlled drugs.           Patient's diagnosis is consistent with fall, scalp laceration, hip contusion.  Patient presented to the emergency department after mechanical fall while missing a step while exiting her house.  Patient had a laceration to left parietal region.  Differential included skull fracture, intracranial hemorrhage, scalp contusion, scalp laceration, hip fracture, pelvis fracture, hip contusion.  Imaging is reassuring with soft tissue edema about the laceration to the scalp and evidence of contusion to the right hip.  No fractures of the skull, cervical spine or hip.  No intracranial hemorrhage.  Patient is neurologically intact.  Laceration is closed as described above with no complications.  Tetanus shot is updated in the emergency department.  Patient will be prescribed Ultram for symptom relief.  Follow-up with primary care or orthopedics as needed..Patient is given ED precautions to return to the ED for any worsening or new symptoms.     ____________________________________________  FINAL CLINICAL IMPRESSION(S) / ED DIAGNOSES  Final diagnoses:  Fall, initial encounter  Laceration of scalp, initial encounter  Contusion of right hip, initial encounter      NEW MEDICATIONS STARTED DURING THIS VISIT:  ED Discharge Orders         Ordered    traMADol (ULTRAM) 50 MG tablet  Every 6 hours PRN     07/06/19 2155              This chart  was dictated using voice recognition software/Dragon. Despite best efforts to proofread, errors can occur which can change the meaning. Any change was purely unintentional.    Brynda Peon 07/06/19 2155    Nance Pear, MD 07/06/19 2257

## 2019-07-06 NOTE — ED Triage Notes (Signed)
Pt states that she miss stepped coming out her door causing her to fall into the wrought iron railing, pt has a lac to the top of her head, denies loc and some soreness over her right side/hip, pt is seated in a w/c without difficulty and states so far has been able to stand without diff.

## 2019-07-13 ENCOUNTER — Emergency Department
Admission: EM | Admit: 2019-07-13 | Discharge: 2019-07-13 | Disposition: A | Discharge status: Home / Self Care | Payer: PPO | Attending: Student | Admitting: Student

## 2019-07-13 ENCOUNTER — Other Ambulatory Visit: Payer: Self-pay

## 2019-07-13 ENCOUNTER — Encounter: Payer: Self-pay | Admitting: Emergency Medicine

## 2019-07-13 DIAGNOSIS — Z4802 Encounter for removal of sutures: Secondary | ICD-10-CM

## 2019-07-13 DIAGNOSIS — I10 Essential (primary) hypertension: Secondary | ICD-10-CM | POA: Diagnosis not present

## 2019-07-13 DIAGNOSIS — Z853 Personal history of malignant neoplasm of breast: Secondary | ICD-10-CM | POA: Diagnosis not present

## 2019-07-13 NOTE — ED Triage Notes (Signed)
FIRST NURSE NOTE- here to have 2 staples removed. NAD

## 2019-07-13 NOTE — ED Triage Notes (Signed)
Here to have staples out. No problems with wound per pt.

## 2019-07-13 NOTE — ED Provider Notes (Signed)
[facility] Emergency Department Provider Note  ________________  Time seen: 1900  I have reviewed the triage vital signs and the nursing notes.   HISTORY  Chief Complaint Suture / Staple Removal   HPI  Danielle Nixon is a 84 y.o. female presents to the emergency department for staple removal.  She had 2 staples inserted into a scalp wound 5 days ago and was advised to return to have them removed.  Patient denies complaints today.   Past Medical History:  Diagnosis Date  . Arthritis    hands  . Atrophic vaginitis   . Breast cancer ([place]) 2011   RT LUMPECTOMY W/RADIATION  . DM (diabetes mellitus) ([place])    diet controlled  . Flank pain   . GERD (gastroesophageal reflux disease)   . Hematuria, microscopic   . Hydronephrosis   . Hypertension   . Over weight   . Personal history of radiation therapy   . Urethral stricture   . Urge incontinence   . Vaginal yeast infection     Patient Active Problem List   Diagnosis Date Noted  . Carcinoma of overlapping sites of right breast in female, estrogen receptor positive ([place]) 10/06/2016  . Urge incontinence 06/17/2015  . Atrophic vaginitis 06/17/2015  . H/O malignant neoplasm of breast 07/18/2013    Past Surgical History:  Procedure Laterality Date  . ABDOMINAL HYSTERECTOMY    . AMPUTATION TOE Left 06/22/2019   Procedure: AMPUTATION TOE MPJ 3RD LEFT;  Surgeon: Samara Deist, DPM;  Location: [facility] ORS;  Service: Podiatry;  Laterality: Left;  . APPENDECTOMY    . [place] flap ureteroscopy    . BREAST BIOPSY Right 2011   +  . BREAST BIOPSY Right 01/05/2017   path pending  . BREAST EXCISIONAL BIOPSY Right 03/15/2017  . CATARACT EXTRACTION, BILATERAL    . CHOLECYSTECTOMY    . FOOT SURGERY Right   . GALLBLADDER SURGERY    . PARTIAL MASTECTOMY WITH NEEDLE LOCALIZATION Right 03/15/2017   Procedure: PARTIAL MASTECTOMY WITH NEEDLE LOCALIZATION;  Surgeon: Leonie Green, MD;  Location: [facility] ORS;   Service: General;  Laterality: Right;  . TONSILLECTOMY       Allergies Ciprofloxacin, Doxycycline, Nitrofurantoin monohyd macro, Penicillins, Pseudoephedrine hcl, Sulfa antibiotics, and Terazosin  Family History  Problem Relation Age of Onset  . Diabetes Other   . Heart disease Other   . Breast cancer Cousin   . Kidney disease Neg Hx   . Bladder Cancer Neg Hx     Social History Social History   Tobacco Use  . Smoking status: Never Smoker  . Smokeless tobacco: Never Used  Substance Use Topics  . Alcohol use: No    Alcohol/week: 0.0 standard drinks  . Drug use: No    Review of Systems  Constitutional: Denies fever.  HEENT: No change from baseline Respiratory: No cough or shortness of breath Musculoskeletal: No pain. Skin: healing wound; pain gradually resolving.  ____________________________________________   PHYSICAL EXAM:  VITAL SIGNS: ED Triage Vitals [07/13/19 1857]  Enc Vitals Group     BP      Pulse      Resp      Temp      Temp src      SpO2      Weight 200 lb (90.7 kg)     Height 5\' 9"  (1.753 m)     Head Circumference      Peak Flow      Pain Score 0  Pain Loc      Pain Edu?      Excl. in [place]?     Constitutional: Appears well. No distress HEENT: Atraumtaic, normal appearance, sclera normal, voice normal. Respiratory: Respirations even and unlabored.  Cardiovascular: Capillary refill normal. Peripheral pulses 2+ Musculoskeletal: Full ROM x 4. Skin: Well approximated. No evidence of infection or cellulitis. Neurovascular: Gait steady; Alert and oriented x 4.   PROCEDURES  Procedure(s) performed: SUTURE REMOVAL Performed by: Me  Location details: Scalp  Wound Appearance: clean  Sutures/Staples Removed: 2  Facility: sutures placed in this facility  Patient tolerance: Patient tolerated the procedure well with no immediate complications.    ____________________________________________   INITIAL IMPRESSION / ASSESSMENT AND PLAN  / ED COURSE  Pertinent labs & imaging results that were available during my care of the patient were reviewed by me and considered in my medical decision making (see chart for details).   Wound care discussed. Patient advised to keep covered with sunscreen. Patient was advised to return to the ER for symptoms that change or worsen if unable to schedule an appointment with primary care.  ____________________________________________   FINAL CLINICAL IMPRESSION(S) / ED DIAGNOSES  Final diagnoses:  Removal of staple      Victorino Dike, FNP 07/13/19 1909    Lilia Pro., MD 07/13/19 2020

## 2019-08-09 ENCOUNTER — Other Ambulatory Visit
Admission: RE | Admit: 2019-08-09 | Discharge: 2019-08-09 | Disposition: A | Discharge status: Home / Self Care | Payer: PPO | Source: Ambulatory Visit | Attending: Infectious Diseases | Admitting: Infectious Diseases

## 2019-08-09 DIAGNOSIS — R9431 Abnormal electrocardiogram [ECG] [EKG]: Secondary | ICD-10-CM | POA: Diagnosis not present

## 2019-08-09 DIAGNOSIS — R000 Tachycardia, unspecified: Secondary | ICD-10-CM | POA: Diagnosis not present

## 2019-08-09 DIAGNOSIS — R42 Dizziness and giddiness: Secondary | ICD-10-CM | POA: Diagnosis not present

## 2019-08-09 LAB — BRAIN NATRIURETIC PEPTIDE: B Natriuretic Peptide: 19 pg/mL (ref 0.0–100.0)

## 2019-08-09 LAB — TROPONIN I (HIGH SENSITIVITY): Troponin I (High Sensitivity): 3 ng/L (ref <18)

## 2019-08-13 DIAGNOSIS — S066X9A Traumatic subarachnoid hemorrhage with loss of consciousness of unspecified duration, initial encounter: Secondary | ICD-10-CM | POA: Diagnosis not present

## 2019-08-13 DIAGNOSIS — E876 Hypokalemia: Secondary | ICD-10-CM | POA: Diagnosis not present

## 2019-08-13 DIAGNOSIS — S065X9A Traumatic subdural hemorrhage with loss of consciousness of unspecified duration, initial encounter: Secondary | ICD-10-CM | POA: Diagnosis not present

## 2019-08-13 DIAGNOSIS — R509 Fever, unspecified: Secondary | ICD-10-CM | POA: Diagnosis not present

## 2019-08-13 DIAGNOSIS — K449 Diaphragmatic hernia without obstruction or gangrene: Secondary | ICD-10-CM | POA: Diagnosis not present

## 2019-08-14 DIAGNOSIS — R509 Fever, unspecified: Secondary | ICD-10-CM | POA: Diagnosis not present

## 2019-08-14 DIAGNOSIS — S066X9A Traumatic subarachnoid hemorrhage with loss of consciousness of unspecified duration, initial encounter: Secondary | ICD-10-CM | POA: Diagnosis not present

## 2019-08-14 DIAGNOSIS — K449 Diaphragmatic hernia without obstruction or gangrene: Secondary | ICD-10-CM | POA: Diagnosis not present

## 2019-08-14 DIAGNOSIS — I10 Essential (primary) hypertension: Secondary | ICD-10-CM | POA: Diagnosis not present

## 2019-08-14 DIAGNOSIS — E876 Hypokalemia: Secondary | ICD-10-CM | POA: Diagnosis not present

## 2019-08-14 DIAGNOSIS — E119 Type 2 diabetes mellitus without complications: Secondary | ICD-10-CM | POA: Diagnosis not present

## 2019-08-14 DIAGNOSIS — R000 Tachycardia, unspecified: Secondary | ICD-10-CM | POA: Diagnosis not present

## 2019-08-14 DIAGNOSIS — E7800 Pure hypercholesterolemia, unspecified: Secondary | ICD-10-CM | POA: Diagnosis not present

## 2019-08-14 DIAGNOSIS — S065X9A Traumatic subdural hemorrhage with loss of consciousness of unspecified duration, initial encounter: Secondary | ICD-10-CM | POA: Diagnosis not present

## 2019-08-14 DIAGNOSIS — I495 Sick sinus syndrome: Secondary | ICD-10-CM | POA: Diagnosis not present

## 2019-08-15 DIAGNOSIS — S066X9A Traumatic subarachnoid hemorrhage with loss of consciousness of unspecified duration, initial encounter: Secondary | ICD-10-CM | POA: Diagnosis not present

## 2019-08-15 DIAGNOSIS — S065X9A Traumatic subdural hemorrhage with loss of consciousness of unspecified duration, initial encounter: Secondary | ICD-10-CM | POA: Diagnosis not present

## 2019-08-15 DIAGNOSIS — R509 Fever, unspecified: Secondary | ICD-10-CM | POA: Diagnosis not present

## 2019-08-15 DIAGNOSIS — K449 Diaphragmatic hernia without obstruction or gangrene: Secondary | ICD-10-CM | POA: Diagnosis not present

## 2019-08-15 DIAGNOSIS — E876 Hypokalemia: Secondary | ICD-10-CM | POA: Diagnosis not present

## 2019-08-16 DIAGNOSIS — S066X9A Traumatic subarachnoid hemorrhage with loss of consciousness of unspecified duration, initial encounter: Secondary | ICD-10-CM | POA: Diagnosis not present

## 2019-08-16 DIAGNOSIS — R509 Fever, unspecified: Secondary | ICD-10-CM | POA: Diagnosis not present

## 2019-08-16 DIAGNOSIS — S065X9A Traumatic subdural hemorrhage with loss of consciousness of unspecified duration, initial encounter: Secondary | ICD-10-CM | POA: Diagnosis not present

## 2019-08-16 DIAGNOSIS — E876 Hypokalemia: Secondary | ICD-10-CM | POA: Diagnosis not present

## 2019-08-16 DIAGNOSIS — K449 Diaphragmatic hernia without obstruction or gangrene: Secondary | ICD-10-CM | POA: Diagnosis not present

## 2019-08-17 DIAGNOSIS — E876 Hypokalemia: Secondary | ICD-10-CM | POA: Diagnosis not present

## 2019-08-17 DIAGNOSIS — R509 Fever, unspecified: Secondary | ICD-10-CM | POA: Diagnosis not present

## 2019-08-17 DIAGNOSIS — K449 Diaphragmatic hernia without obstruction or gangrene: Secondary | ICD-10-CM | POA: Diagnosis not present

## 2019-08-17 DIAGNOSIS — S065X9A Traumatic subdural hemorrhage with loss of consciousness of unspecified duration, initial encounter: Secondary | ICD-10-CM | POA: Diagnosis not present

## 2019-08-17 DIAGNOSIS — S066X9A Traumatic subarachnoid hemorrhage with loss of consciousness of unspecified duration, initial encounter: Secondary | ICD-10-CM | POA: Diagnosis not present

## 2019-08-20 DIAGNOSIS — N390 Urinary tract infection, site not specified: Secondary | ICD-10-CM | POA: Diagnosis not present

## 2019-08-20 DIAGNOSIS — S065X9A Traumatic subdural hemorrhage with loss of consciousness of unspecified duration, initial encounter: Secondary | ICD-10-CM | POA: Diagnosis not present

## 2019-08-20 DIAGNOSIS — B9629 Other Escherichia coli [E. coli] as the cause of diseases classified elsewhere: Secondary | ICD-10-CM | POA: Diagnosis not present

## 2019-08-20 DIAGNOSIS — S066X9A Traumatic subarachnoid hemorrhage with loss of consciousness of unspecified duration, initial encounter: Secondary | ICD-10-CM | POA: Diagnosis not present

## 2019-08-20 DIAGNOSIS — R1311 Dysphagia, oral phase: Secondary | ICD-10-CM | POA: Diagnosis not present

## 2019-08-20 DIAGNOSIS — I629 Nontraumatic intracranial hemorrhage, unspecified: Secondary | ICD-10-CM | POA: Diagnosis not present

## 2019-08-20 DIAGNOSIS — R339 Retention of urine, unspecified: Secondary | ICD-10-CM | POA: Diagnosis not present

## 2019-08-21 DIAGNOSIS — I629 Nontraumatic intracranial hemorrhage, unspecified: Secondary | ICD-10-CM | POA: Diagnosis not present

## 2019-08-21 DIAGNOSIS — N390 Urinary tract infection, site not specified: Secondary | ICD-10-CM | POA: Diagnosis not present

## 2019-08-21 DIAGNOSIS — R339 Retention of urine, unspecified: Secondary | ICD-10-CM | POA: Diagnosis not present

## 2019-08-21 DIAGNOSIS — S066X9A Traumatic subarachnoid hemorrhage with loss of consciousness of unspecified duration, initial encounter: Secondary | ICD-10-CM | POA: Diagnosis not present

## 2019-08-21 DIAGNOSIS — S065X9A Traumatic subdural hemorrhage with loss of consciousness of unspecified duration, initial encounter: Secondary | ICD-10-CM | POA: Diagnosis not present

## 2019-08-21 DIAGNOSIS — R1311 Dysphagia, oral phase: Secondary | ICD-10-CM | POA: Diagnosis not present

## 2019-08-21 DIAGNOSIS — B9629 Other Escherichia coli [E. coli] as the cause of diseases classified elsewhere: Secondary | ICD-10-CM | POA: Diagnosis not present

## 2019-08-22 DIAGNOSIS — R1311 Dysphagia, oral phase: Secondary | ICD-10-CM | POA: Diagnosis not present

## 2019-08-22 DIAGNOSIS — N390 Urinary tract infection, site not specified: Secondary | ICD-10-CM | POA: Diagnosis not present

## 2019-08-22 DIAGNOSIS — S066X9A Traumatic subarachnoid hemorrhage with loss of consciousness of unspecified duration, initial encounter: Secondary | ICD-10-CM | POA: Diagnosis not present

## 2019-08-22 DIAGNOSIS — R339 Retention of urine, unspecified: Secondary | ICD-10-CM | POA: Diagnosis not present

## 2019-08-22 DIAGNOSIS — S065X9A Traumatic subdural hemorrhage with loss of consciousness of unspecified duration, initial encounter: Secondary | ICD-10-CM | POA: Diagnosis not present

## 2019-08-22 DIAGNOSIS — B9629 Other Escherichia coli [E. coli] as the cause of diseases classified elsewhere: Secondary | ICD-10-CM | POA: Diagnosis not present

## 2019-08-22 DIAGNOSIS — I629 Nontraumatic intracranial hemorrhage, unspecified: Secondary | ICD-10-CM | POA: Diagnosis not present

## 2019-08-28 DIAGNOSIS — B9620 Unspecified Escherichia coli [E. coli] as the cause of diseases classified elsewhere: Secondary | ICD-10-CM | POA: Diagnosis not present

## 2019-08-28 DIAGNOSIS — I10 Essential (primary) hypertension: Secondary | ICD-10-CM | POA: Diagnosis not present

## 2019-08-28 DIAGNOSIS — R945 Abnormal results of liver function studies: Secondary | ICD-10-CM | POA: Diagnosis not present

## 2019-08-28 DIAGNOSIS — N390 Urinary tract infection, site not specified: Secondary | ICD-10-CM | POA: Diagnosis not present

## 2019-08-28 DIAGNOSIS — I629 Nontraumatic intracranial hemorrhage, unspecified: Secondary | ICD-10-CM | POA: Diagnosis not present

## 2019-09-03 DIAGNOSIS — I10 Essential (primary) hypertension: Secondary | ICD-10-CM | POA: Diagnosis not present

## 2019-09-03 DIAGNOSIS — E7800 Pure hypercholesterolemia, unspecified: Secondary | ICD-10-CM | POA: Diagnosis not present

## 2019-09-03 DIAGNOSIS — E119 Type 2 diabetes mellitus without complications: Secondary | ICD-10-CM | POA: Diagnosis not present

## 2019-09-03 DIAGNOSIS — R000 Tachycardia, unspecified: Secondary | ICD-10-CM | POA: Diagnosis not present

## 2019-09-03 DIAGNOSIS — N183 Chronic kidney disease, stage 3 unspecified: Secondary | ICD-10-CM | POA: Diagnosis not present

## 2019-10-15 DIAGNOSIS — J189 Pneumonia, unspecified organism: Secondary | ICD-10-CM | POA: Diagnosis not present

## 2019-10-15 DIAGNOSIS — E119 Type 2 diabetes mellitus without complications: Secondary | ICD-10-CM | POA: Diagnosis not present

## 2019-10-15 DIAGNOSIS — R000 Tachycardia, unspecified: Secondary | ICD-10-CM | POA: Diagnosis not present

## 2020-02-21 DIAGNOSIS — E1142 Type 2 diabetes mellitus with diabetic polyneuropathy: Secondary | ICD-10-CM | POA: Diagnosis not present

## 2020-02-21 DIAGNOSIS — L97522 Non-pressure chronic ulcer of other part of left foot with fat layer exposed: Secondary | ICD-10-CM | POA: Diagnosis not present

## 2020-05-04 ENCOUNTER — Emergency Department: Payer: PPO

## 2020-05-04 ENCOUNTER — Other Ambulatory Visit: Payer: Self-pay

## 2020-05-04 ENCOUNTER — Emergency Department
Admission: EM | Admit: 2020-05-04 | Discharge: 2020-05-04 | Disposition: A | Discharge status: Home / Self Care | Payer: PPO | Attending: Emergency Medicine | Admitting: Emergency Medicine

## 2020-05-04 DIAGNOSIS — I6200 Nontraumatic subdural hemorrhage, unspecified: Secondary | ICD-10-CM | POA: Diagnosis not present

## 2020-05-04 DIAGNOSIS — I10 Essential (primary) hypertension: Secondary | ICD-10-CM | POA: Insufficient documentation

## 2020-05-04 DIAGNOSIS — S199XXA Unspecified injury of neck, initial encounter: Secondary | ICD-10-CM | POA: Diagnosis not present

## 2020-05-04 DIAGNOSIS — M47812 Spondylosis without myelopathy or radiculopathy, cervical region: Secondary | ICD-10-CM | POA: Diagnosis not present

## 2020-05-04 DIAGNOSIS — R000 Tachycardia, unspecified: Secondary | ICD-10-CM | POA: Diagnosis not present

## 2020-05-04 DIAGNOSIS — I6782 Cerebral ischemia: Secondary | ICD-10-CM | POA: Diagnosis not present

## 2020-05-04 DIAGNOSIS — E119 Type 2 diabetes mellitus without complications: Secondary | ICD-10-CM | POA: Insufficient documentation

## 2020-05-04 DIAGNOSIS — R059 Cough, unspecified: Secondary | ICD-10-CM | POA: Diagnosis not present

## 2020-05-04 DIAGNOSIS — S0101XA Laceration without foreign body of scalp, initial encounter: Secondary | ICD-10-CM | POA: Diagnosis not present

## 2020-05-04 DIAGNOSIS — Z79899 Other long term (current) drug therapy: Secondary | ICD-10-CM | POA: Insufficient documentation

## 2020-05-04 DIAGNOSIS — W19XXXA Unspecified fall, initial encounter: Secondary | ICD-10-CM | POA: Diagnosis not present

## 2020-05-04 DIAGNOSIS — I6201 Nontraumatic acute subdural hemorrhage: Secondary | ICD-10-CM | POA: Diagnosis not present

## 2020-05-04 DIAGNOSIS — J323 Chronic sphenoidal sinusitis: Secondary | ICD-10-CM | POA: Diagnosis not present

## 2020-05-04 DIAGNOSIS — S0990XA Unspecified injury of head, initial encounter: Secondary | ICD-10-CM | POA: Diagnosis not present

## 2020-05-04 DIAGNOSIS — S065X0A Traumatic subdural hemorrhage without loss of consciousness, initial encounter: Secondary | ICD-10-CM | POA: Diagnosis not present

## 2020-05-04 DIAGNOSIS — J0100 Acute maxillary sinusitis, unspecified: Secondary | ICD-10-CM | POA: Diagnosis not present

## 2020-05-04 DIAGNOSIS — Z853 Personal history of malignant neoplasm of breast: Secondary | ICD-10-CM | POA: Insufficient documentation

## 2020-05-04 DIAGNOSIS — R0902 Hypoxemia: Secondary | ICD-10-CM | POA: Diagnosis not present

## 2020-05-04 DIAGNOSIS — S065X9A Traumatic subdural hemorrhage with loss of consciousness of unspecified duration, initial encounter: Secondary | ICD-10-CM

## 2020-05-04 DIAGNOSIS — W01198A Fall on same level from slipping, tripping and stumbling with subsequent striking against other object, initial encounter: Secondary | ICD-10-CM | POA: Diagnosis not present

## 2020-05-04 DIAGNOSIS — J320 Chronic maxillary sinusitis: Secondary | ICD-10-CM | POA: Diagnosis not present

## 2020-05-04 DIAGNOSIS — S065XAA Traumatic subdural hemorrhage with loss of consciousness status unknown, initial encounter: Secondary | ICD-10-CM

## 2020-05-04 DIAGNOSIS — N390 Urinary tract infection, site not specified: Secondary | ICD-10-CM | POA: Insufficient documentation

## 2020-05-04 DIAGNOSIS — S0003XA Contusion of scalp, initial encounter: Secondary | ICD-10-CM | POA: Diagnosis not present

## 2020-05-04 DIAGNOSIS — Z87828 Personal history of other (healed) physical injury and trauma: Secondary | ICD-10-CM | POA: Diagnosis not present

## 2020-05-04 LAB — URINALYSIS, COMPLETE (UACMP) WITH MICROSCOPIC
Bilirubin Urine: NEGATIVE
Glucose, UA: NEGATIVE mg/dL
Hgb urine dipstick: NEGATIVE
Ketones, ur: NEGATIVE mg/dL
Nitrite: POSITIVE — AB
Protein, ur: NEGATIVE mg/dL
Specific Gravity, Urine: 1.018 (ref 1.005–1.030)
pH: 5 (ref 5.0–8.0)

## 2020-05-04 LAB — CBC
HCT: 36.5 % (ref 36.0–46.0)
Hemoglobin: 12.2 g/dL (ref 12.0–15.0)
MCH: 31.9 pg (ref 26.0–34.0)
MCHC: 33.4 g/dL (ref 30.0–36.0)
MCV: 95.5 fL (ref 80.0–100.0)
Platelets: 169 10*3/uL (ref 150–400)
RBC: 3.82 MIL/uL — ABNORMAL LOW (ref 3.87–5.11)
RDW: 13.2 % (ref 11.5–15.5)
WBC: 7.3 10*3/uL (ref 4.0–10.5)
nRBC: 0 % (ref 0.0–0.2)

## 2020-05-04 LAB — BASIC METABOLIC PANEL
Anion gap: 9 (ref 5–15)
BUN: 30 mg/dL — ABNORMAL HIGH (ref 8–23)
CO2: 26 mmol/L (ref 22–32)
Calcium: 8.8 mg/dL — ABNORMAL LOW (ref 8.9–10.3)
Chloride: 106 mmol/L (ref 98–111)
Creatinine, Ser: 1.13 mg/dL — ABNORMAL HIGH (ref 0.44–1.00)
GFR, Estimated: 49 mL/min — ABNORMAL LOW (ref >60)
Glucose, Bld: 161 mg/dL — ABNORMAL HIGH (ref 70–99)
Potassium: 3.9 mmol/L (ref 3.5–5.1)
Sodium: 141 mmol/L (ref 135–145)

## 2020-05-04 MED ORDER — FOSFOMYCIN TROMETHAMINE 3 G PO PACK
3.0000 g | PACK | Freq: Once | ORAL | Status: AC
  Administered 2020-05-04: 3 g via ORAL
  Filled 2020-05-04: qty 3

## 2020-05-04 NOTE — ED Notes (Signed)
Pt denies needs. Pt watching tv no new change in mentation noted.

## 2020-05-04 NOTE — ED Notes (Signed)
MD notified of pt unknown last tetanus. No new orders at this time.

## 2020-05-04 NOTE — Discharge Instructions (Signed)
Please follow-up with Dr. Cari Caraway. If you do not hear from him by lunchtime tomorrow please call the office at the number provided to arrange a follow-up appointment. As we discussed your laceration has been repaired with staples, these two staples will need to be removed in approximately 10 days. Please follow-up with your doctor for staple removal. Return to the emergency department for any worsening headache, any weakness or numbness of any arm or leg confusion or slurred speech.

## 2020-05-04 NOTE — ED Triage Notes (Addendum)
Pt arrived via [facility] from home with reports of fall, pt states she "just fell backwards" while doing housework. Pt states she hit the back of her head on a chest of drawers.  Pt has laceration to back of head, denies any blood thinner use. Denies any LOC.  Pt reports pain to back of head at this time, 7/10.    Pt also c/o neck pain on the R side, no tenderness palpated to c-spine.

## 2020-05-04 NOTE — ED Provider Notes (Signed)
[facility] Emergency Department Provider Note  Time seen: 4:14 PM  I have reviewed the triage vital signs and the nursing notes.   HISTORY  Chief Complaint Fall   HPI Danielle Nixon is a 84 y.o. female with a past medical history of arthritis, diabetes, gastric reflux, hypertension, presents to the emergency department after a fall.  According to the patient she was hanging up close in her closet when she lost her balance falling backwards hitting her head on her dresser.  Patient denies LOC.  Does state bleeding to the back of the head which is why she came to the emergency department for evaluation.  Denies any weakness or numbness confusion or slurred speech.  Does state very mild neck soreness as well.  No other injuries.   Past Medical History:  Diagnosis Date  . Arthritis    hands  . Atrophic vaginitis   . Breast cancer ([place]) 2011   RT LUMPECTOMY W/RADIATION  . DM (diabetes mellitus) ([place])    diet controlled  . Flank pain   . GERD (gastroesophageal reflux disease)   . Hematuria, microscopic   . Hydronephrosis   . Hypertension   . Over weight   . Personal history of radiation therapy   . Urethral stricture   . Urge incontinence   . Vaginal yeast infection     Patient Active Problem List   Diagnosis Date Noted  . Carcinoma of overlapping sites of right breast in female, estrogen receptor positive ([place]) 10/06/2016  . Urge incontinence 06/17/2015  . Atrophic vaginitis 06/17/2015  . H/O malignant neoplasm of breast 07/18/2013    Past Surgical History:  Procedure Laterality Date  . ABDOMINAL HYSTERECTOMY    . AMPUTATION TOE Left 06/22/2019   Procedure: AMPUTATION TOE MPJ 3RD LEFT;  Surgeon: Samara Deist, DPM;  Location: [facility] ORS;  Service: Podiatry;  Laterality: Left;  . APPENDECTOMY    . [place] flap ureteroscopy    . BREAST BIOPSY Right 2011   +  . BREAST BIOPSY Right 01/05/2017   path pending  . BREAST EXCISIONAL BIOPSY Right  03/15/2017  . CATARACT EXTRACTION, BILATERAL    . CHOLECYSTECTOMY    . FOOT SURGERY Right   . GALLBLADDER SURGERY    . PARTIAL MASTECTOMY WITH NEEDLE LOCALIZATION Right 03/15/2017   Procedure: PARTIAL MASTECTOMY WITH NEEDLE LOCALIZATION;  Surgeon: Leonie Green, MD;  Location: [facility] ORS;  Service: General;  Laterality: Right;  . TONSILLECTOMY      Prior to Admission medications   Medication Sig Start Date End Date Taking? Authorizing Provider  amitriptyline (ELAVIL) 50 MG tablet Take 50 mg by mouth at bedtime.  09/23/14   [provider]  atorvastatin (LIPITOR) 10 MG tablet Take 10 mg by mouth every evening.  09/23/14   [provider]  clindamycin (CLEOCIN) 150 MG capsule Take 600 mg by mouth See admin instructions. FOR DENTAL PROCEDURES 12/07/16   [provider]  gabapentin (NEURONTIN) 300 MG capsule Take 600 mg by mouth 3 (three) times daily.  12/11/14   [provider]  HYDROcodone-acetaminophen (NORCO) 5-325 MG tablet Take 1 tablet by mouth every 6 (six) hours as needed for moderate pain. 06/22/19   Samara Deist, DPM  ibuprofen (ADVIL,MOTRIN) 200 MG tablet Take 200 mg by mouth every 8 (eight) hours as needed for mild pain.    [provider]  naproxen sodium (ANAPROX) 220 MG tablet Take 220 mg by mouth daily as needed (PAIN).    [provider]  traMADol (ULTRAM) 50 MG tablet Take 1 tablet (50 mg total) by mouth every 6 (six) hours as needed. 07/06/19   Cuthriell, Charline Bills, PA-C  valsartan-hydrochlorothiazide (DIOVAN-HCT) 80-12.5 MG tablet Take 1 tablet by mouth daily. 04/02/19   [provider]    Allergies  Allergen Reactions  . Ciprofloxacin Nausea And Vomiting    Rash also  . Doxycycline Rash  . Nitrofurantoin Monohyd Macro Rash  . Penicillins Rash    Has taken recently without problems (in 2016) Did it involve swelling of the face/tongue/throat, SOB, or low BP? No Did it involve sudden or severe rash/hives,  skin peeling, or any reaction on the inside of your mouth or nose? Unknown Did you need to seek medical attention at a hospital or doctor's office? Yes When did it last happen?2016 If all above answers are "NO", may proceed with cephalosporin use.  Other reaction(s): Unknown Has taken recently without problems Has taken recently without problems (in 2016) Did it involve swelling of the face/tongue/throat, SOB, or low BP? No Did it involve sudden or severe rash/hives, skin peeling, or any reaction on the inside of your mouth or nose? Unknown Did you need to seek medical attention at a hospital or doctor's office? Yes When did it last happen?2016 If all above answers are "NO", may proceed with cephalosporin use.  . Pseudoephedrine Hcl Other (See Comments)    Insomnia   . Sulfa Antibiotics Rash    Makes patient extremely hyper  . Terazosin Other (See Comments)    unknown    Family History  Problem Relation Age of Onset  . Diabetes Other   . Heart disease Other   . Breast cancer Cousin   . Kidney disease Neg Hx   . Bladder Cancer Neg Hx     Social History Social History   Tobacco Use  . Smoking status: Never Smoker  . Smokeless tobacco: Never Used  Vaping Use  . Vaping Use: Never used  Substance Use Topics  . Alcohol use: No    Alcohol/week: 0.0 standard drinks  . Drug use: No    Review of Systems Constitutional: Negative for LOC Cardiovascular: Negative for chest pain. Respiratory: Negative for shortness of breath. Gastrointestinal: Negative for abdominal pain Genitourinary: States slight dysuria Musculoskeletal: Negative for musculoskeletal complaints Neurological: Minimal pain to the back of the head. All other ROS negative  ____________________________________________   PHYSICAL EXAM:  VITAL SIGNS: ED Triage Vitals  Enc Vitals Group     BP 05/04/20 1457 133/67     Pulse Rate 05/04/20 1457 (!) 116     Resp 05/04/20 1457 20     Temp 05/04/20  1457 97.8 F (36.6 C)     Temp Source 05/04/20 1457 Oral     SpO2 05/04/20 1457 97 %     Weight 05/04/20 1443 200 lb (90.7 kg)     Height 05/04/20 1443 5\' 10"  (1.778 m)     Head Circumference --      Peak Flow --      Pain Score 05/04/20 1443 7     Pain Loc --      Pain Edu? --      Excl. in [place]? --    Constitutional: Alert and oriented. Well appearing and in no distress. Eyes: Normal exam ENT      Head: Small to moderate occipital scalp hematoma with dried blood.  We will clean to evaluate further.      Mouth/Throat: Mucous membranes are moist.  Cardiovascular: Normal rate, regular rhythm. Respiratory: Normal respiratory effort without tachypnea nor retractions. Breath sounds are clear Gastrointestinal: Soft and nontender. No distention.  Musculoskeletal: Nontender with normal range of motion in all extremities.  No cervical spine tenderness. Neurologic:  Normal speech and language. No gross focal neurologic deficits Skin:  Skin is warm.  Laceration occipital scalp. Psychiatric: Mood and affect are normal.   ____________________________________________    EKG  EKG viewed and interpreted by myself shows sinus tachycardia at 114 bpm with a narrow QRS, normal axis, normal intervals, no concerning ST changes.  ____________________________________________    RADIOLOGY  CT scan shows a subtle acute subdural hematoma measuring 2 to 3 mm with no midline shift.  CT scans otherwise negative for acute abnormality.  ____________________________________________   INITIAL IMPRESSION / ASSESSMENT AND PLAN / ED COURSE  Pertinent labs & imaging results that were available during my care of the patient were reviewed by me and considered in my medical decision making (see chart for details).   Patient presents to the emergency department after a fall with occipital scalp hematoma and small laceration.  Laceration appears that it will require repair with either sutures or staples.  Patient  CT scan shows a small subdural hematoma.  Patient states very minimal head discomfort.  Intact neurologically.  I spoke to Dr. Cari Caraway of neurosurgery who recommends repeat CT imaging in 6 hours, if unchanged the patient could be discharged home and he will follow up with her in the office.  Patient agreeable to plan of care.  Patient's urinalysis does show a urinary tract infection.  On review of systems questioning patient did state slight dysuria.  We will treat with a one-time dose of fosfomycin and send urine culture.  Repeat CT is essentially unchanged. Patient will be discharged home will follow up with Dr. Cari Caraway. Applied two staples to the patient's scalp laceration. Discussed follow-up.  LACERATION REPAIR Performed by: Harvest Dark Authorized by: Harvest Dark Consent: Verbal consent obtained. Risks and benefits: risks, benefits and alternatives were discussed Consent given by: patient Patient identity confirmed: provided demographic data Prepped and Draped in normal sterile fashion Wound explored  Laceration Location: occipital scalp  Laceration Length: 2cm  No Foreign Bodies seen or palpated  Irrigation method: sterile water Amount of cleaning: standard  Skin closure: staples   Number of sutures: 2  Patient tolerance: Patient tolerated the procedure well with no immediate complications.   JAMEKA IVIE was evaluated in Emergency Department on 05/04/2020 for the symptoms described in the history of present illness. She was evaluated in the context of the global COVID-19 pandemic, which necessitated consideration that the patient might be at risk for infection with the SARS-CoV-2 virus that causes COVID-19. Institutional protocols and algorithms that pertain to the evaluation of patients at risk for COVID-19 are in a state of rapid change based on information released by regulatory bodies including the CDC and federal and state organizations. These  policies and algorithms were followed during the patient's care in the ED.  ____________________________________________   FINAL CLINICAL IMPRESSION(S) / ED DIAGNOSES  Subdural hematoma Urinary tract infection   Harvest Dark, MD 05/04/20 2148

## 2020-05-04 NOTE — ED Notes (Signed)
Pt assisted to bathroom at this time.

## 2020-05-04 NOTE — ED Notes (Signed)
First Nurse Note: Pt to ED via [facility] from home for fall. Pt hit the back of her head. Pt is not on blood thinners. Pt was tachy at 120. SpO2 88-93% on room air, pt placed on 2 liter via [place] with sats improving to 95%. Pt has hematoma on the back of head with some bleeding. Pt is in NAD.   20 G Lt hand 250 cc NS.

## 2020-05-04 NOTE — ED Notes (Signed)
Patient transported to CT 

## 2020-05-04 NOTE — ED Notes (Signed)
Head of bed lowered. Pt informed is waiting on repeat head ct. Pt verbalizes understanding, head of bed at 45 degrees. Call bell at right side. Pt watching tv.

## 2020-05-04 NOTE — ED Notes (Signed)
Pt assisted up to commode to void.  

## 2020-05-06 ENCOUNTER — Other Ambulatory Visit: Payer: Self-pay | Admitting: Neurosurgery

## 2020-05-06 DIAGNOSIS — S065X9A Traumatic subdural hemorrhage with loss of consciousness of unspecified duration, initial encounter: Secondary | ICD-10-CM

## 2020-05-06 DIAGNOSIS — S065XAA Traumatic subdural hemorrhage with loss of consciousness status unknown, initial encounter: Secondary | ICD-10-CM

## 2020-05-21 ENCOUNTER — Encounter: Payer: Self-pay | Admitting: Emergency Medicine

## 2020-05-21 ENCOUNTER — Emergency Department
Admission: EM | Admit: 2020-05-21 | Discharge: 2020-05-21 | Disposition: A | Discharge status: Home / Self Care | Payer: PPO | Attending: Emergency Medicine | Admitting: Emergency Medicine

## 2020-05-21 ENCOUNTER — Other Ambulatory Visit: Payer: Self-pay

## 2020-05-21 DIAGNOSIS — X58XXXD Exposure to other specified factors, subsequent encounter: Secondary | ICD-10-CM | POA: Insufficient documentation

## 2020-05-21 DIAGNOSIS — Z4802 Encounter for removal of sutures: Secondary | ICD-10-CM | POA: Diagnosis not present

## 2020-05-21 DIAGNOSIS — S0101XD Laceration without foreign body of scalp, subsequent encounter: Secondary | ICD-10-CM | POA: Diagnosis not present

## 2020-05-21 NOTE — ED Provider Notes (Signed)
[facility] Emergency Department Provider Note   ____________________________________________   First MD Initiated Contact with Patient 05/21/20 1436     (approximate)  I have reviewed the triage vital signs and the nursing notes.   HISTORY  Chief Complaint Suture / Staple Removal    HPI Danielle Nixon is a 84 y.o. female patient presents for staple removal from scalp.  Patient sustained laceration and contusion to the scalp on 05/04/2020.  Patient was no concerns or complaints today.         Past Medical History:  Diagnosis Date  . Arthritis    hands  . Atrophic vaginitis   . Breast cancer ([place]) 2011   RT LUMPECTOMY W/RADIATION  . DM (diabetes mellitus) ([place])    diet controlled  . Flank pain   . GERD (gastroesophageal reflux disease)   . Hematuria, microscopic   . Hydronephrosis   . Hypertension   . Over weight   . Personal history of radiation therapy   . Urethral stricture   . Urge incontinence   . Vaginal yeast infection     Patient Active Problem List   Diagnosis Date Noted  . Carcinoma of overlapping sites of right breast in female, estrogen receptor positive ([place]) 10/06/2016  . Urge incontinence 06/17/2015  . Atrophic vaginitis 06/17/2015  . H/O malignant neoplasm of breast 07/18/2013    Past Surgical History:  Procedure Laterality Date  . ABDOMINAL HYSTERECTOMY    . AMPUTATION TOE Left 06/22/2019   Procedure: AMPUTATION TOE MPJ 3RD LEFT;  Surgeon: Samara Deist, DPM;  Location: [facility] ORS;  Service: Podiatry;  Laterality: Left;  . APPENDECTOMY    . [place] flap ureteroscopy    . BREAST BIOPSY Right 2011   +  . BREAST BIOPSY Right 01/05/2017   path pending  . BREAST EXCISIONAL BIOPSY Right 03/15/2017  . CATARACT EXTRACTION, BILATERAL    . CHOLECYSTECTOMY    . FOOT SURGERY Right   . GALLBLADDER SURGERY    . PARTIAL MASTECTOMY WITH NEEDLE LOCALIZATION Right 03/15/2017   Procedure: PARTIAL MASTECTOMY WITH NEEDLE  LOCALIZATION;  Surgeon: Leonie Green, MD;  Location: [facility] ORS;  Service: General;  Laterality: Right;  . TONSILLECTOMY      Prior to Admission medications   Medication Sig Start Date End Date Taking? Authorizing Provider  amitriptyline (ELAVIL) 50 MG tablet Take 50 mg by mouth at bedtime.  09/23/14   [provider]  atorvastatin (LIPITOR) 10 MG tablet Take 10 mg by mouth every evening.  09/23/14   [provider]  clindamycin (CLEOCIN) 150 MG capsule Take 600 mg by mouth See admin instructions. FOR DENTAL PROCEDURES 12/07/16   [provider]  gabapentin (NEURONTIN) 300 MG capsule Take 600 mg by mouth 3 (three) times daily.  12/11/14   [provider]  valsartan-hydrochlorothiazide (DIOVAN-HCT) 80-12.5 MG tablet Take 1 tablet by mouth daily. 04/02/19   [provider]    Allergies Ciprofloxacin, Doxycycline, Nitrofurantoin monohyd macro, Penicillins, Pseudoephedrine hcl, Sulfa antibiotics, and Terazosin  Family History  Problem Relation Age of Onset  . Diabetes Other   . Heart disease Other   . Breast cancer Cousin   . Kidney disease Neg Hx   . Bladder Cancer Neg Hx     Social History Social History   Tobacco Use  . Smoking status: Never Smoker  . Smokeless tobacco: Never Used  Vaping Use  . Vaping Use: Never used  Substance Use Topics  . Alcohol use: No  Alcohol/week: 0.0 standard drinks  . Drug use: No    Review of Systems  Constitutional: No fever/chills Eyes: No visual changes. ENT: No sore throat. Cardiovascular: Denies chest pain. Respiratory: Denies shortness of breath. Gastrointestinal: No abdominal pain.  No nausea, no vomiting.  No diarrhea.  No constipation. Genitourinary: Negative for dysuria. Musculoskeletal: Negative for back pain. Skin: Negative for rash. Neurological: Negative for headaches, focal weakness or numbness. Endocrine:  Hypertension Allergic/Immunilogical: Cipro, doxycycline, Macrobid,  penicillin, Sudafed, sulfa antibiotics,and Terazosin. ____________________________________________   PHYSICAL EXAM:  VITAL SIGNS: ED Triage Vitals  Enc Vitals Group     BP 05/21/20 1409 (!) 96/58     Pulse Rate 05/21/20 1409 (!) 114     Resp 05/21/20 1409 16     Temp 05/21/20 1409 98.8 F (37.1 C)     Temp Source 05/21/20 1409 Oral     SpO2 05/21/20 1409 93 %     Weight 05/21/20 1406 200 lb (90.7 kg)     Height 05/21/20 1406 5\' 10"  (1.778 m)     Head Circumference --      Peak Flow --      Pain Score 05/21/20 1406 0     Pain Loc --      Pain Edu? --      Excl. in [place]? --    Constitutional: Alert and oriented. Well appearing and in no acute distress. Cardiovascular: Normal rate, regular rhythm. Grossly normal heart sounds.  Good peripheral circulation. Respiratory: Normal respiratory effort.  No retractions. Lungs CTAB. Neurologic:  Normal speech and language. No gross focal neurologic deficits are appreciated. No gait instability. Skin:  Skin is warm, dry and intact. No rash noted.  Healed occipital laceration Psychiatric: Mood and affect are normal. Speech and behavior are normal.  ____________________________________________   LABS (all labs ordered are listed, but only abnormal results are displayed)  Labs Reviewed - No data to display ____________________________________________  EKG   ____________________________________________  RADIOLOGY I, Sable Feil, personally viewed and evaluated these images (plain radiographs) as part of my medical decision making, as well as reviewing the written report by the radiologist.  ED MD interpretation:    Official radiology report(s): No results found.  ____________________________________________   PROCEDURES  Procedure(s) performed (including Critical Care):  .Suture Removal  Date/Time: 05/21/2020 3:11 PM Performed by: Sable Feil, PA-C Authorized by: Sable Feil, PA-C   Consent:    Consent  obtained:  Verbal   Consent given by:  Patient   Risks discussed:  Bleeding, pain and wound separation Location:    Location:  Head/neck   Head/neck location:  Scalp Procedure details:    Wound appearance:  No signs of infection, good wound healing and clean   Number of staples removed:  2 Post-procedure details:    Post-removal:  No dressing applied   Patient tolerance of procedure:  Tolerated well, no immediate complications     ____________________________________________   INITIAL IMPRESSION / ASSESSMENT AND PLAN / ED COURSE  As part of my medical decision making, I reviewed the following data within the [place]         Patient presents for staple removal.  See procedure note.  Patient given discharge care instructions.  Patient advised follow-up PCP.      ____________________________________________   FINAL CLINICAL IMPRESSION(S) / ED DIAGNOSES  Final diagnoses:  Encounter for staple removal     ED Discharge Orders    None      *Please  note:  Danielle Nixon was evaluated in Emergency Department on 05/21/2020 for the symptoms described in the history of present illness. She was evaluated in the context of the global COVID-19 pandemic, which necessitated consideration that the patient might be at risk for infection with the SARS-CoV-2 virus that causes COVID-19. Institutional protocols and algorithms that pertain to the evaluation of patients at risk for COVID-19 are in a state of rapid change based on information released by regulatory bodies including the CDC and federal and state organizations. These policies and algorithms were followed during the patient's care in the ED.  Some ED evaluations and interventions may be delayed as a result of limited staffing during and the pandemic.*   Note:  This document was prepared using Dragon voice recognition software and may include unintentional dictation errors.    Sable Feil,  PA-C 05/21/20 1512    Naaman Plummer, MD 05/21/20 [phone]

## 2020-05-21 NOTE — ED Triage Notes (Signed)
Pt to ED via P[facility] with c/o needing staples removed. Pt had staples placed on 11/21 s/p fall.

## 2020-05-21 NOTE — Discharge Instructions (Addendum)
Follow discharge care instruction.

## 2020-05-26 ENCOUNTER — Ambulatory Visit: Payer: PPO | Attending: Neurosurgery

## 2020-11-04 DIAGNOSIS — G609 Hereditary and idiopathic neuropathy, unspecified: Secondary | ICD-10-CM | POA: Diagnosis not present

## 2020-11-04 DIAGNOSIS — L97512 Non-pressure chronic ulcer of other part of right foot with fat layer exposed: Secondary | ICD-10-CM | POA: Diagnosis not present

## 2020-11-06 DIAGNOSIS — G5792 Unspecified mononeuropathy of left lower limb: Secondary | ICD-10-CM | POA: Diagnosis not present

## 2020-11-18 DIAGNOSIS — L97512 Non-pressure chronic ulcer of other part of right foot with fat layer exposed: Secondary | ICD-10-CM | POA: Diagnosis not present

## 2020-11-18 DIAGNOSIS — G609 Hereditary and idiopathic neuropathy, unspecified: Secondary | ICD-10-CM | POA: Diagnosis not present

## 2020-12-08 DIAGNOSIS — G5792 Unspecified mononeuropathy of left lower limb: Secondary | ICD-10-CM | POA: Diagnosis not present

## 2020-12-09 DIAGNOSIS — L97512 Non-pressure chronic ulcer of other part of right foot with fat layer exposed: Secondary | ICD-10-CM | POA: Diagnosis not present

## 2020-12-09 DIAGNOSIS — E1142 Type 2 diabetes mellitus with diabetic polyneuropathy: Secondary | ICD-10-CM | POA: Diagnosis not present

## 2020-12-09 DIAGNOSIS — L97522 Non-pressure chronic ulcer of other part of left foot with fat layer exposed: Secondary | ICD-10-CM | POA: Diagnosis not present

## 2020-12-09 DIAGNOSIS — G609 Hereditary and idiopathic neuropathy, unspecified: Secondary | ICD-10-CM | POA: Diagnosis not present

## 2020-12-30 DIAGNOSIS — G609 Hereditary and idiopathic neuropathy, unspecified: Secondary | ICD-10-CM | POA: Diagnosis not present

## 2020-12-30 DIAGNOSIS — L97512 Non-pressure chronic ulcer of other part of right foot with fat layer exposed: Secondary | ICD-10-CM | POA: Diagnosis not present

## 2020-12-30 DIAGNOSIS — E1142 Type 2 diabetes mellitus with diabetic polyneuropathy: Secondary | ICD-10-CM | POA: Diagnosis not present

## 2021-01-22 DIAGNOSIS — L97512 Non-pressure chronic ulcer of other part of right foot with fat layer exposed: Secondary | ICD-10-CM | POA: Diagnosis not present

## 2021-01-22 DIAGNOSIS — E1142 Type 2 diabetes mellitus with diabetic polyneuropathy: Secondary | ICD-10-CM | POA: Diagnosis not present

## 2021-01-22 DIAGNOSIS — G609 Hereditary and idiopathic neuropathy, unspecified: Secondary | ICD-10-CM | POA: Diagnosis not present

## 2021-02-24 DIAGNOSIS — L97512 Non-pressure chronic ulcer of other part of right foot with fat layer exposed: Secondary | ICD-10-CM | POA: Diagnosis not present

## 2021-02-24 DIAGNOSIS — E1142 Type 2 diabetes mellitus with diabetic polyneuropathy: Secondary | ICD-10-CM | POA: Diagnosis not present

## 2021-02-25 DIAGNOSIS — G5792 Unspecified mononeuropathy of left lower limb: Secondary | ICD-10-CM | POA: Diagnosis not present

## 2021-03-18 DIAGNOSIS — C50811 Malignant neoplasm of overlapping sites of right female breast: Secondary | ICD-10-CM | POA: Diagnosis not present

## 2021-03-18 DIAGNOSIS — Z170 Estrogen receptor positive status [ER+]: Secondary | ICD-10-CM | POA: Diagnosis not present

## 2021-03-18 DIAGNOSIS — E7800 Pure hypercholesterolemia, unspecified: Secondary | ICD-10-CM | POA: Diagnosis not present

## 2021-03-18 DIAGNOSIS — N183 Chronic kidney disease, stage 3 unspecified: Secondary | ICD-10-CM | POA: Diagnosis not present

## 2021-03-18 DIAGNOSIS — L97512 Non-pressure chronic ulcer of other part of right foot with fat layer exposed: Secondary | ICD-10-CM | POA: Diagnosis not present

## 2021-03-18 DIAGNOSIS — Z0000 Encounter for general adult medical examination without abnormal findings: Secondary | ICD-10-CM | POA: Diagnosis not present

## 2021-03-18 DIAGNOSIS — I10 Essential (primary) hypertension: Secondary | ICD-10-CM | POA: Diagnosis not present

## 2021-03-18 DIAGNOSIS — Z20822 Contact with and (suspected) exposure to covid-19: Secondary | ICD-10-CM | POA: Diagnosis not present

## 2021-03-18 DIAGNOSIS — Z23 Encounter for immunization: Secondary | ICD-10-CM | POA: Diagnosis not present

## 2021-03-18 DIAGNOSIS — I495 Sick sinus syndrome: Secondary | ICD-10-CM | POA: Diagnosis not present

## 2021-03-18 DIAGNOSIS — J029 Acute pharyngitis, unspecified: Secondary | ICD-10-CM | POA: Diagnosis not present

## 2021-03-18 DIAGNOSIS — E119 Type 2 diabetes mellitus without complications: Secondary | ICD-10-CM | POA: Diagnosis not present

## 2021-03-18 DIAGNOSIS — G609 Hereditary and idiopathic neuropathy, unspecified: Secondary | ICD-10-CM | POA: Diagnosis not present

## 2021-04-13 IMAGING — CT CT CERVICAL SPINE W/O CM
3 of 4 series · 12 of 33 positions shown, 14 images · non-contrast
Comparison: None.

CLINICAL DATA: Post-traumatic headache. Laceration to the top of
the head.

EXAM:
CT HEAD WITHOUT CONTRAST
CT CERVICAL SPINE WITHOUT CONTRAST
TECHNIQUE: Multidetector CT imaging of the head and cervical spine was
performed following the standard protocol without intravenous
contrast. Multiplanar CT image reconstructions of the cervical spine
were also generated.

[Series 4: sagittal bone · sagittal · 0.28mm/px · 5 of 61 slices shown, 6 images]
[im 21/61  bone]
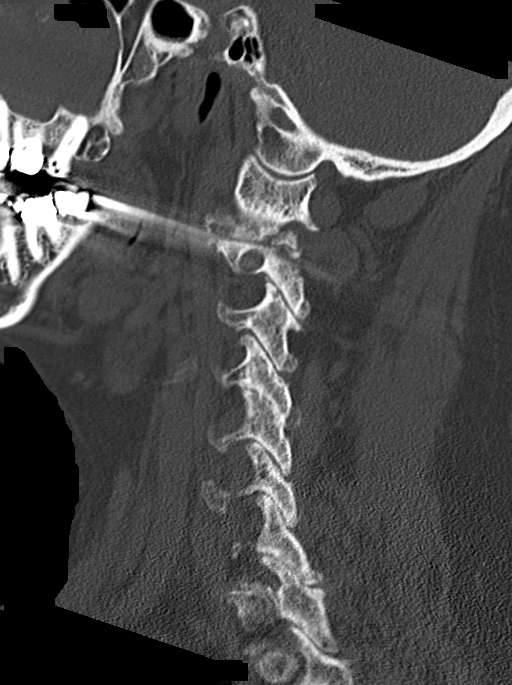
[im 26/61  bone]
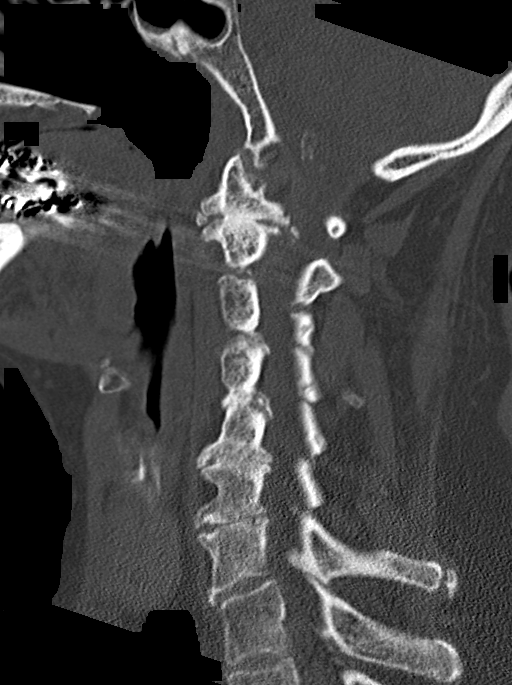
[im 31/61  soft-tissue]
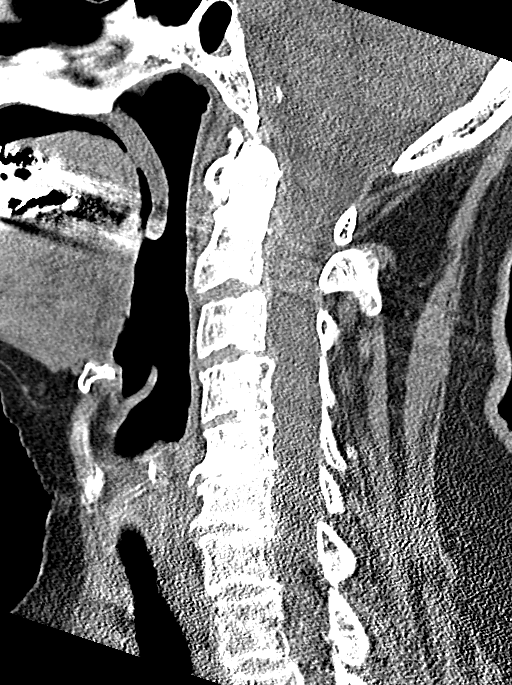
[im 31/61  bone]
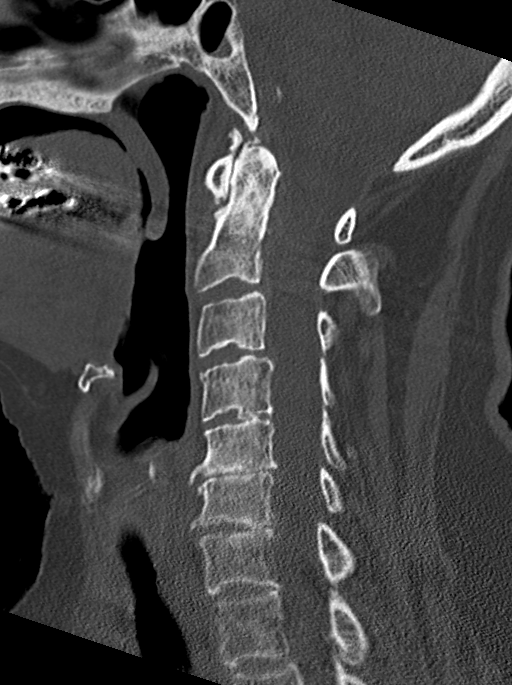
[im 36/61  bone]
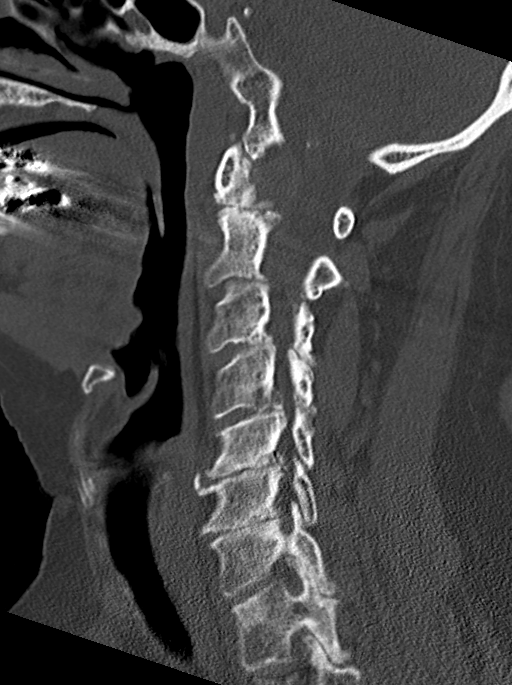
[im 41/61  bone]
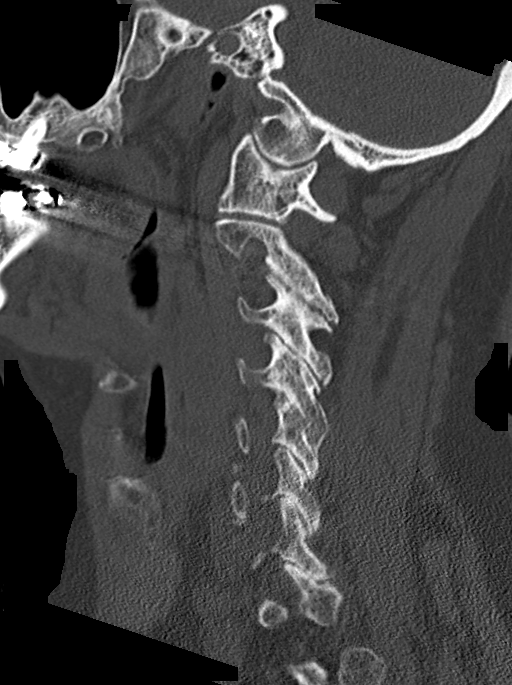

[Series 5: coronal bone · coronal · 0.23mm/px · 3 of 52 slices shown]
[im 11/52  bone]
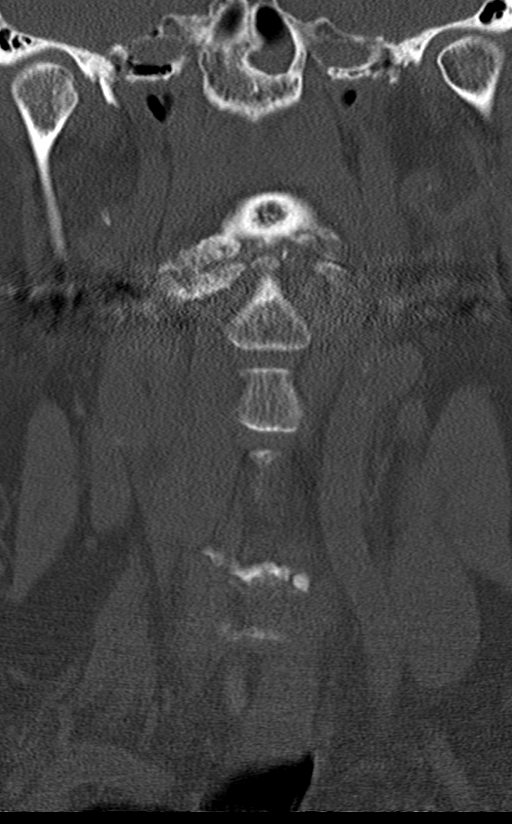
[im 21/52  bone]
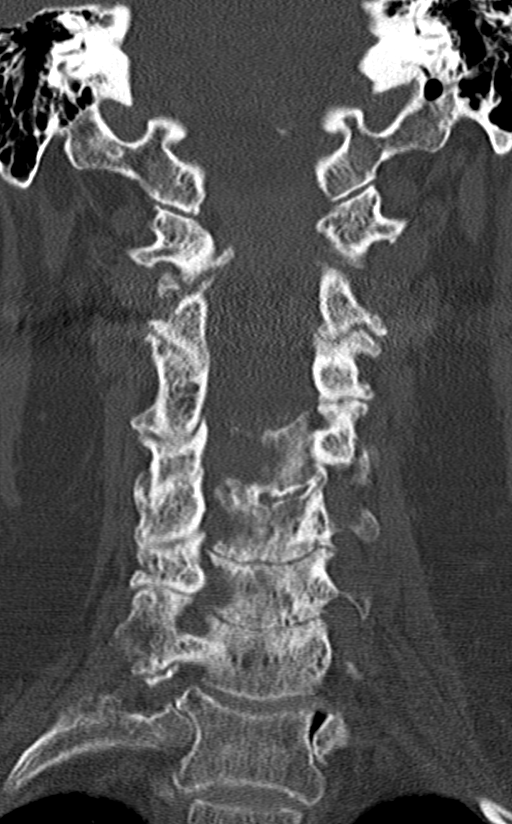
[im 31/52  bone]
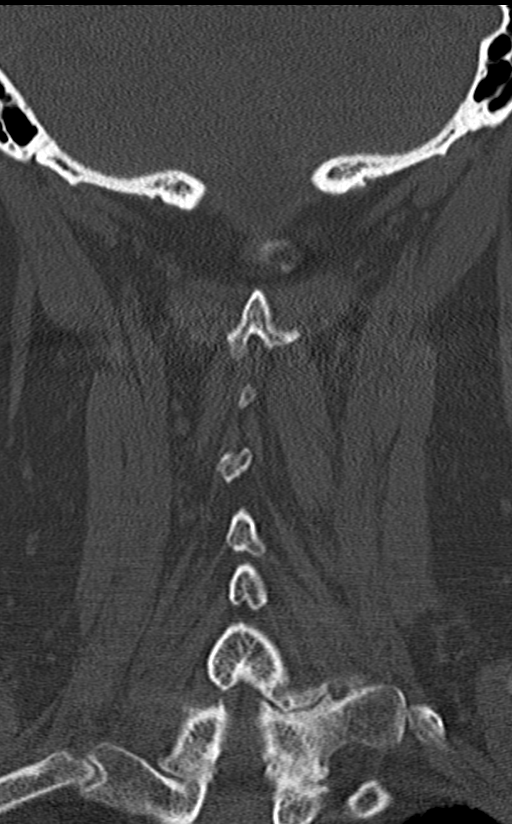

[Series 6: orthogonal bone · axial · 0.23mm/px · z∈[-267,-153]mm · 4 of 90 slices shown, 5 images]
[im 15/90  soft-tissue]
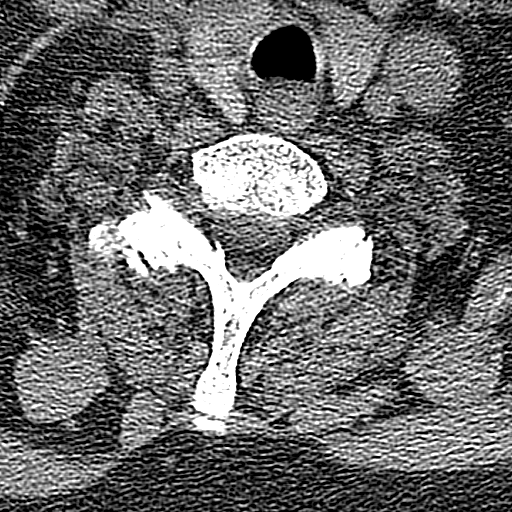
[im 15/90  bone]
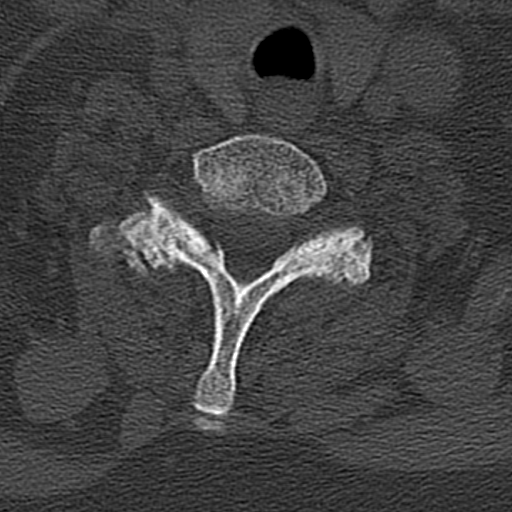
[im 30/90  bone]
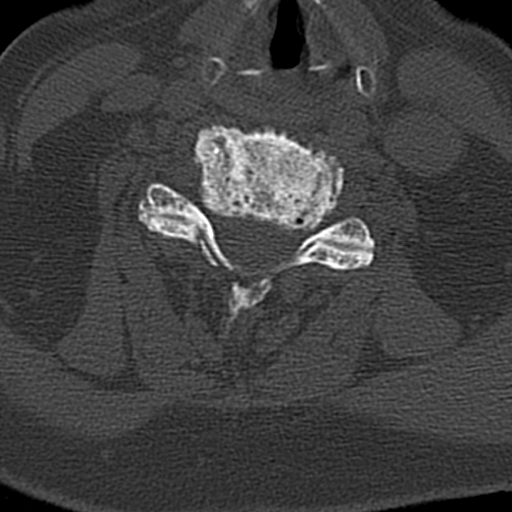
[im 60/90  bone]
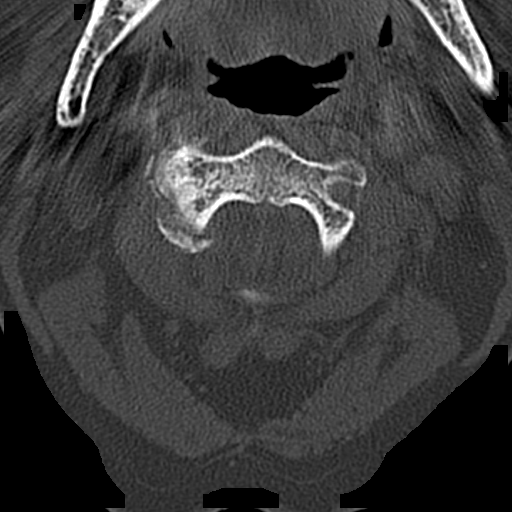
[im 75/90  bone]
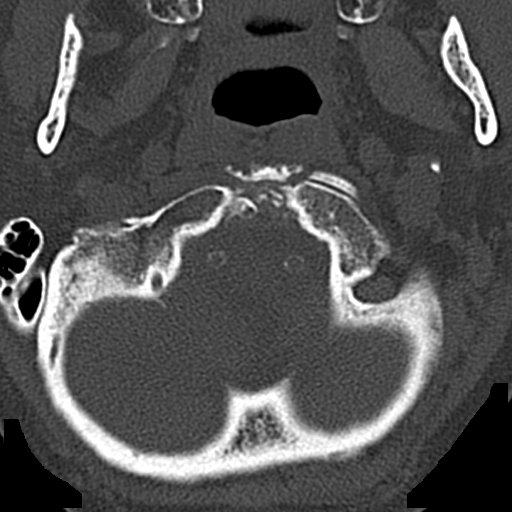

[12 of 33 positions shown; findings below may reference images not displayed]

FINDINGS: CT HEAD FINDINGS

Brain: No evidence of acute infarction, hemorrhage, hydrocephalus,
extra-axial collection or mass lesion/mass effect. Atrophy and
chronic microvascular ischemic changes are noted.

Vascular: No hyperdense vessel or unexpected calcification.

Skull: There is posterior scalp swelling without evidence for an
underlying calvarial fracture or radiopaque foreign body.

Sinuses/Orbits: There is near complete opacification of the right
maxillary sinus, likely chronic. There is some mucosal thickening of
the sphenoid sinuses bilaterally. The frontal sinuses are
essentially clear. The mastoid air cells are essentially clear.

Other: None.

CT CERVICAL SPINE FINDINGS

Alignment: There is straightening of the normal cervical lordotic
curvature.

Skull base and vertebrae: No acute fracture. No primary bone lesion
or focal pathologic process.

Soft tissues and spinal canal: No prevertebral fluid or swelling. No
visible canal hematoma.

Disc levels: Multilevel disc height loss is noted throughout the
cervical spine. Bilateral osseous neural foraminal narrowing is
noted at multiple levels.

Upper chest: Negative.

Other: None
IMPRESSION: 1. No evidence of acute intracranial pathology.
2. Posterior scalp swelling without evidence of an underlying
calvarial fracture or radiopaque foreign body.
3. Near complete opacification of the right maxillary sinus, likely
chronic.
4. No evidence of acute fracture or subluxation of the cervical
spine.
5. Multilevel degenerative disc disease of the cervical spine.

## 2021-04-30 DIAGNOSIS — E1142 Type 2 diabetes mellitus with diabetic polyneuropathy: Secondary | ICD-10-CM | POA: Diagnosis not present

## 2021-04-30 DIAGNOSIS — L97512 Non-pressure chronic ulcer of other part of right foot with fat layer exposed: Secondary | ICD-10-CM | POA: Diagnosis not present

## 2021-05-06 ENCOUNTER — Other Ambulatory Visit: Payer: Self-pay

## 2021-05-06 ENCOUNTER — Emergency Department: Payer: PPO

## 2021-05-06 ENCOUNTER — Encounter: Payer: Self-pay | Admitting: Emergency Medicine

## 2021-05-06 DIAGNOSIS — Z20822 Contact with and (suspected) exposure to covid-19: Secondary | ICD-10-CM | POA: Diagnosis present

## 2021-05-06 DIAGNOSIS — Z853 Personal history of malignant neoplasm of breast: Secondary | ICD-10-CM | POA: Diagnosis not present

## 2021-05-06 DIAGNOSIS — M542 Cervicalgia: Secondary | ICD-10-CM | POA: Diagnosis not present

## 2021-05-06 DIAGNOSIS — R296 Repeated falls: Secondary | ICD-10-CM | POA: Diagnosis present

## 2021-05-06 DIAGNOSIS — Z833 Family history of diabetes mellitus: Secondary | ICD-10-CM | POA: Diagnosis not present

## 2021-05-06 DIAGNOSIS — R519 Headache, unspecified: Secondary | ICD-10-CM | POA: Diagnosis not present

## 2021-05-06 DIAGNOSIS — E876 Hypokalemia: Secondary | ICD-10-CM | POA: Diagnosis present

## 2021-05-06 DIAGNOSIS — Z89422 Acquired absence of other left toe(s): Secondary | ICD-10-CM

## 2021-05-06 DIAGNOSIS — Z803 Family history of malignant neoplasm of breast: Secondary | ICD-10-CM | POA: Diagnosis not present

## 2021-05-06 DIAGNOSIS — Z170 Estrogen receptor positive status [ER+]: Secondary | ICD-10-CM | POA: Diagnosis not present

## 2021-05-06 DIAGNOSIS — D6959 Other secondary thrombocytopenia: Secondary | ICD-10-CM | POA: Diagnosis present

## 2021-05-06 DIAGNOSIS — K219 Gastro-esophageal reflux disease without esophagitis: Secondary | ICD-10-CM | POA: Diagnosis present

## 2021-05-06 DIAGNOSIS — N390 Urinary tract infection, site not specified: Principal | ICD-10-CM | POA: Diagnosis present

## 2021-05-06 DIAGNOSIS — B964 Proteus (mirabilis) (morganii) as the cause of diseases classified elsewhere: Secondary | ICD-10-CM | POA: Diagnosis present

## 2021-05-06 DIAGNOSIS — Z923 Personal history of irradiation: Secondary | ICD-10-CM

## 2021-05-06 DIAGNOSIS — W19XXXA Unspecified fall, initial encounter: Secondary | ICD-10-CM | POA: Diagnosis not present

## 2021-05-06 DIAGNOSIS — Z96653 Presence of artificial knee joint, bilateral: Secondary | ICD-10-CM | POA: Diagnosis not present

## 2021-05-06 DIAGNOSIS — S0990XA Unspecified injury of head, initial encounter: Secondary | ICD-10-CM | POA: Diagnosis not present

## 2021-05-06 DIAGNOSIS — I10 Essential (primary) hypertension: Secondary | ICD-10-CM | POA: Diagnosis present

## 2021-05-06 DIAGNOSIS — G9341 Metabolic encephalopathy: Secondary | ICD-10-CM | POA: Diagnosis not present

## 2021-05-06 DIAGNOSIS — M7989 Other specified soft tissue disorders: Secondary | ICD-10-CM | POA: Diagnosis not present

## 2021-05-06 DIAGNOSIS — Z79899 Other long term (current) drug therapy: Secondary | ICD-10-CM | POA: Diagnosis not present

## 2021-05-06 DIAGNOSIS — R0902 Hypoxemia: Secondary | ICD-10-CM | POA: Diagnosis not present

## 2021-05-06 DIAGNOSIS — E119 Type 2 diabetes mellitus without complications: Secondary | ICD-10-CM | POA: Diagnosis not present

## 2021-05-06 DIAGNOSIS — E785 Hyperlipidemia, unspecified: Secondary | ICD-10-CM | POA: Diagnosis not present

## 2021-05-06 DIAGNOSIS — K5900 Constipation, unspecified: Secondary | ICD-10-CM | POA: Diagnosis not present

## 2021-05-06 DIAGNOSIS — Z90710 Acquired absence of both cervix and uterus: Secondary | ICD-10-CM | POA: Diagnosis not present

## 2021-05-06 DIAGNOSIS — M79604 Pain in right leg: Secondary | ICD-10-CM | POA: Diagnosis not present

## 2021-05-06 LAB — CBC
HCT: 37.2 % (ref 36.0–46.0)
Hemoglobin: 12.1 g/dL (ref 12.0–15.0)
MCH: 31.2 pg (ref 26.0–34.0)
MCHC: 32.5 g/dL (ref 30.0–36.0)
MCV: 95.9 fL (ref 80.0–100.0)
Platelets: 159 10*3/uL (ref 150–400)
RBC: 3.88 MIL/uL (ref 3.87–5.11)
RDW: 14 % (ref 11.5–15.5)
WBC: 7.1 10*3/uL (ref 4.0–10.5)
nRBC: 0 % (ref 0.0–0.2)

## 2021-05-06 LAB — TROPONIN I (HIGH SENSITIVITY)
Troponin I (High Sensitivity): 5 ng/L (ref <18)
Troponin I (High Sensitivity): 6 ng/L (ref <18)

## 2021-05-06 LAB — BASIC METABOLIC PANEL
Anion gap: 5 (ref 5–15)
BUN: 24 mg/dL — ABNORMAL HIGH (ref 8–23)
CO2: 25 mmol/L (ref 22–32)
Calcium: 8.9 mg/dL (ref 8.9–10.3)
Chloride: 109 mmol/L (ref 98–111)
Creatinine, Ser: 0.88 mg/dL (ref 0.44–1.00)
GFR, Estimated: >60 mL/min (ref >60)
Glucose, Bld: 128 mg/dL — ABNORMAL HIGH (ref 70–99)
Potassium: 3.8 mmol/L (ref 3.5–5.1)
Sodium: 139 mmol/L (ref 135–145)

## 2021-05-06 NOTE — ED Triage Notes (Signed)
Pt in via EMS from home with c/o leg pain. Pt has called EMS x's 3 over the last 2 days and refused to come each time. Pt difficulty to get hx from. Pt c/o HA for the last week which is unusual for her. 188/102

## 2021-05-06 NOTE — ED Notes (Addendum)
Pt had initial oxygen saturation of 89% on RA at rest during the start of the triage assessment, pt was then placed on 4L [place] and went up to 99-100%.  As triage continued, oxygen dropped down to 2L and sats remained 98-99%.  At this time oxygen was removed sats remained at 96%. Pt denies any shortness of breath or chest pain at this time.

## 2021-05-06 NOTE — ED Provider Notes (Signed)
Emergency Medicine Provider Triage Evaluation Note  Danielle Nixon , a 85 y.o. female  was evaluated in triage.  Pt complains of recent fall.  Patient states she is fell several times over the last few days.  Denies any known reason for falling, no chest pain, shortness of breath.  Was recently diagnosed with peripheral neuropathy.  Patient most recently fell today, fell backwards and hit the back of her head.  No LOC, nausea or vomiting but does have a headache.  No upper or lower extremity pain.  No lower back pain.  Review of Systems  Positive: Headache Negative: Chest pain, shortness of breath  Physical Exam  BP (!) 162/90 (BP Location: Left Arm)   Pulse (!) 109   Temp 98.2 F (36.8 C) (Oral)   Resp 18   Ht 5\' 9"  (1.753 m)   Wt 90.7 kg   SpO2 99%   BMI 29.53 kg/m  Gen:   Awake, no distress presents in a wheelchair.  HEENT atraumatic Resp:  Normal effort MSK:   Moves extremities without difficulty normal range of motion of both hips with no discomfort.  Prior bilateral total knee arthroplasties with some mild laxity on the left foot compared to the right.  She is able to straight leg raise bilaterally.  Small hematoma to the left lower leg just below the knee. Other:    Medical Decision Making  Medically screening exam initiated at 7:43 PM.  Appropriate orders placed.  Danielle Nixon was informed that the remainder of the evaluation will be completed by another provider, this initial triage assessment does not replace that evaluation, and the importance of remaining in the ED until their evaluation is complete.  Imaging and lab work ordered.  Vital signs stable.   Duanne Guess, PA-C 05/06/21 1947    Blake Divine, MD 05/07/21 1044

## 2021-05-06 NOTE — ED Triage Notes (Signed)
Pt arrived via [facility] from home with reports of multiple falls today, pt states she hit the back of her head on the front door, denies any LOC. Pt is unsure what caused her to fall. Pt c/o bilateral leg weakness as well.   Pt reports she has been eating and drinking well. Pt lives alone.

## 2021-05-07 ENCOUNTER — Encounter: Payer: Self-pay | Admitting: Family Medicine

## 2021-05-07 ENCOUNTER — Other Ambulatory Visit: Payer: Self-pay

## 2021-05-07 ENCOUNTER — Emergency Department: Payer: PPO

## 2021-05-07 ENCOUNTER — Inpatient Hospital Stay
Admission: EM | Admit: 2021-05-07 | Discharge: 2021-05-19 | DRG: 689 | Disposition: A | Discharge status: Skilled Nursing Facility | Payer: PPO | Attending: Internal Medicine | Admitting: Internal Medicine

## 2021-05-07 DIAGNOSIS — E1169 Type 2 diabetes mellitus with other specified complication: Secondary | ICD-10-CM | POA: Diagnosis not present

## 2021-05-07 DIAGNOSIS — N3000 Acute cystitis without hematuria: Secondary | ICD-10-CM

## 2021-05-07 DIAGNOSIS — Z89422 Acquired absence of other left toe(s): Secondary | ICD-10-CM | POA: Diagnosis not present

## 2021-05-07 DIAGNOSIS — N390 Urinary tract infection, site not specified: Secondary | ICD-10-CM | POA: Diagnosis present

## 2021-05-07 DIAGNOSIS — R296 Repeated falls: Secondary | ICD-10-CM | POA: Diagnosis not present

## 2021-05-07 DIAGNOSIS — R0600 Dyspnea, unspecified: Secondary | ICD-10-CM | POA: Diagnosis not present

## 2021-05-07 DIAGNOSIS — E876 Hypokalemia: Secondary | ICD-10-CM | POA: Diagnosis not present

## 2021-05-07 DIAGNOSIS — M7989 Other specified soft tissue disorders: Secondary | ICD-10-CM

## 2021-05-07 DIAGNOSIS — I10 Essential (primary) hypertension: Secondary | ICD-10-CM | POA: Diagnosis not present

## 2021-05-07 DIAGNOSIS — Z833 Family history of diabetes mellitus: Secondary | ICD-10-CM | POA: Diagnosis not present

## 2021-05-07 DIAGNOSIS — E119 Type 2 diabetes mellitus without complications: Secondary | ICD-10-CM

## 2021-05-07 DIAGNOSIS — Z923 Personal history of irradiation: Secondary | ICD-10-CM | POA: Diagnosis not present

## 2021-05-07 DIAGNOSIS — M7981 Nontraumatic hematoma of soft tissue: Secondary | ICD-10-CM | POA: Diagnosis not present

## 2021-05-07 DIAGNOSIS — M79604 Pain in right leg: Secondary | ICD-10-CM | POA: Diagnosis not present

## 2021-05-07 DIAGNOSIS — Z20822 Contact with and (suspected) exposure to covid-19: Secondary | ICD-10-CM | POA: Diagnosis present

## 2021-05-07 DIAGNOSIS — S0990XA Unspecified injury of head, initial encounter: Secondary | ICD-10-CM

## 2021-05-07 DIAGNOSIS — K219 Gastro-esophageal reflux disease without esophagitis: Secondary | ICD-10-CM | POA: Diagnosis present

## 2021-05-07 DIAGNOSIS — Z853 Personal history of malignant neoplasm of breast: Secondary | ICD-10-CM | POA: Diagnosis not present

## 2021-05-07 DIAGNOSIS — G319 Degenerative disease of nervous system, unspecified: Secondary | ICD-10-CM | POA: Diagnosis not present

## 2021-05-07 DIAGNOSIS — Z90710 Acquired absence of both cervix and uterus: Secondary | ICD-10-CM | POA: Diagnosis not present

## 2021-05-07 DIAGNOSIS — R41841 Cognitive communication deficit: Secondary | ICD-10-CM | POA: Diagnosis not present

## 2021-05-07 DIAGNOSIS — Z96653 Presence of artificial knee joint, bilateral: Secondary | ICD-10-CM | POA: Diagnosis present

## 2021-05-07 DIAGNOSIS — Z79899 Other long term (current) drug therapy: Secondary | ICD-10-CM | POA: Diagnosis not present

## 2021-05-07 DIAGNOSIS — D6959 Other secondary thrombocytopenia: Secondary | ICD-10-CM | POA: Diagnosis present

## 2021-05-07 DIAGNOSIS — Z743 Need for continuous supervision: Secondary | ICD-10-CM | POA: Diagnosis not present

## 2021-05-07 DIAGNOSIS — K5900 Constipation, unspecified: Secondary | ICD-10-CM | POA: Diagnosis present

## 2021-05-07 DIAGNOSIS — G9341 Metabolic encephalopathy: Secondary | ICD-10-CM | POA: Diagnosis not present

## 2021-05-07 DIAGNOSIS — Z803 Family history of malignant neoplasm of breast: Secondary | ICD-10-CM | POA: Diagnosis not present

## 2021-05-07 DIAGNOSIS — B964 Proteus (mirabilis) (morganii) as the cause of diseases classified elsewhere: Secondary | ICD-10-CM | POA: Diagnosis present

## 2021-05-07 DIAGNOSIS — T07XXXA Unspecified multiple injuries, initial encounter: Secondary | ICD-10-CM

## 2021-05-07 DIAGNOSIS — E785 Hyperlipidemia, unspecified: Secondary | ICD-10-CM | POA: Diagnosis present

## 2021-05-07 DIAGNOSIS — D696 Thrombocytopenia, unspecified: Secondary | ICD-10-CM | POA: Diagnosis not present

## 2021-05-07 DIAGNOSIS — M6281 Muscle weakness (generalized): Secondary | ICD-10-CM | POA: Diagnosis not present

## 2021-05-07 DIAGNOSIS — R0902 Hypoxemia: Secondary | ICD-10-CM | POA: Diagnosis not present

## 2021-05-07 DIAGNOSIS — Z170 Estrogen receptor positive status [ER+]: Secondary | ICD-10-CM | POA: Diagnosis not present

## 2021-05-07 LAB — URINALYSIS, COMPLETE (UACMP) WITH MICROSCOPIC
Bilirubin Urine: NEGATIVE
Glucose, UA: NEGATIVE mg/dL
Hgb urine dipstick: NEGATIVE
Ketones, ur: 5 mg/dL — AB
Nitrite: POSITIVE — AB
Protein, ur: 100 mg/dL — AB
Specific Gravity, Urine: 1.019 (ref 1.005–1.030)
WBC, UA: >50 WBC/hpf — ABNORMAL HIGH (ref 0–5)
pH: 8 (ref 5.0–8.0)

## 2021-05-07 LAB — GLUCOSE, CAPILLARY: Glucose-Capillary: 156 mg/dL — ABNORMAL HIGH (ref 70–99)

## 2021-05-07 LAB — RESP PANEL BY RT-PCR (FLU A&B, COVID) ARPGX2
Influenza A by PCR: NEGATIVE
Influenza B by PCR: NEGATIVE
SARS Coronavirus 2 by RT PCR: NEGATIVE

## 2021-05-07 MED ORDER — IRBESARTAN 150 MG PO TABS
75.0000 mg | ORAL_TABLET | Freq: Every day | ORAL | Status: DC
  Administered 2021-05-07 – 2021-05-11 (×5): 75 mg via ORAL
  Filled 2021-05-07 (×5): qty 1

## 2021-05-07 MED ORDER — SODIUM CHLORIDE 0.45 % IV SOLN: INTRAVENOUS | Status: AC

## 2021-05-07 MED ORDER — SODIUM CHLORIDE 0.9 % IV SOLN
1.0000 g | Freq: Once | INTRAVENOUS | Status: AC
  Administered 2021-05-07: 1 g via INTRAVENOUS
  Filled 2021-05-07: qty 10

## 2021-05-07 MED ORDER — ONDANSETRON HCL 4 MG PO TABS: 4.0000 mg | ORAL_TABLET | Freq: Four times a day (QID) | ORAL | Status: DC | PRN

## 2021-05-07 MED ORDER — ATORVASTATIN CALCIUM 10 MG PO TABS
10.0000 mg | ORAL_TABLET | Freq: Every evening | ORAL | Status: DC
  Administered 2021-05-07 – 2021-05-18 (×11): 10 mg via ORAL
  Filled 2021-05-07 (×12): qty 1

## 2021-05-07 MED ORDER — GABAPENTIN 300 MG PO CAPS
600.0000 mg | ORAL_CAPSULE | Freq: Three times a day (TID) | ORAL | Status: DC
  Administered 2021-05-07 – 2021-05-11 (×15): 600 mg via ORAL
  Filled 2021-05-07 (×17): qty 2

## 2021-05-07 MED ORDER — ACETAMINOPHEN 325 MG PO TABS
650.0000 mg | ORAL_TABLET | Freq: Four times a day (QID) | ORAL | Status: DC | PRN
  Administered 2021-05-07 – 2021-05-18 (×9): 650 mg via ORAL
  Filled 2021-05-07 (×10): qty 2

## 2021-05-07 MED ORDER — AMITRIPTYLINE HCL 50 MG PO TABS
50.0000 mg | ORAL_TABLET | Freq: Every day | ORAL | Status: DC
  Administered 2021-05-07 – 2021-05-18 (×11): 50 mg via ORAL
  Filled 2021-05-07 (×13): qty 1

## 2021-05-07 MED ORDER — VALSARTAN-HYDROCHLOROTHIAZIDE 80-12.5 MG PO TABS: 1.0000 | ORAL_TABLET | Freq: Every day | ORAL | Status: DC

## 2021-05-07 MED ORDER — SODIUM CHLORIDE 0.9 % IV SOLN
1.0000 g | Freq: Once | INTRAVENOUS | Status: AC
  Administered 2021-05-08: 1 g via INTRAVENOUS
  Filled 2021-05-07 (×2): qty 10

## 2021-05-07 MED ORDER — ONDANSETRON HCL 4 MG/2ML IJ SOLN: 4.0000 mg | Freq: Four times a day (QID) | INTRAMUSCULAR | Status: DC | PRN

## 2021-05-07 MED ORDER — ENOXAPARIN SODIUM 40 MG/0.4ML IJ SOSY
40.0000 mg | PREFILLED_SYRINGE | INTRAMUSCULAR | Status: DC
  Administered 2021-05-07 – 2021-05-11 (×5): 40 mg via SUBCUTANEOUS
  Filled 2021-05-07 (×6): qty 0.4

## 2021-05-07 MED ORDER — HYDROCHLOROTHIAZIDE 12.5 MG PO TABS
12.5000 mg | ORAL_TABLET | Freq: Every day | ORAL | Status: DC
  Administered 2021-05-07 – 2021-05-11 (×5): 12.5 mg via ORAL
  Filled 2021-05-07 (×5): qty 1

## 2021-05-07 NOTE — H&P (Signed)
History and Physical    Danielle Nixon PJK:[id number] DOB: 17-Nov-1935 DOA: 05/07/2021  PCP: Baxter Hire, MD   Patient coming from: Home  I have personally briefly reviewed patient's old medical records in [place]  Chief Complaint: Fall  HPI: Danielle Nixon is a 85 y.o. female with medical history significant for breast cancer status postlumpectomy and radiation, hypertension, diabetes mellitus who presents to the emergency room via private vehicle for evaluation following multiple falls in the last couple of days. Patient is unsteady on her feet and has a rolling walker and a cane to use with ambulation.  She is unable to tell me if she has had assist devices when she falls but thinks that she loses her balance every time this happens and that her legs are weak.  She denies feeling dizzy or lightheaded.   She has struck her head on 1 occasion but denies losing consciousness.  She has multiple bruises on her upper and lower extremities from the falls. Patient lives alone. She denies having any nausea, no vomiting, no chest pain, no shortness of breath, no headache, no fever, no chills, no changes in her bowel habits, no abdominal pain, no palpitations, no diaphoresis, no dizziness or lightheadedness. Labs show sodium 139, potassium 3.8, chloride 109, bicarb 25, glucose 128, BUN 24, creatinine 0.8, calcium 8.9, white count 7.1, hemoglobin 12.1, hematocrit 37.2, MCV 95.9, RDW 14.0, platelet count 159 Respiratory viral panel is negative Urine analysis shows pyuria Chest x-ray reviewed by me shows bilateral mid to lower lung field subpleural hazy and streaky densities.  Likely scarring or sequela of prior inflammatory/infectious process Right knee x-ray unremarkable.  No acute findings. X-ray of left tibia-fibula shows soft tissue swelling without acute osseous finding. Twelve-lead EKG reviewed by me shows sinus rhythm.   ED Course: Patient is an 85 year old female who  presents to the ER for evaluation of frequent falls.  She has pyuria and will be admitted to the hospital for further evaluation.   Review of Systems: As per HPI otherwise all other systems reviewed and negative.    Past Medical History:  Diagnosis Date   Arthritis    hands   Atrophic vaginitis    Breast cancer ([place]) 2011   RT LUMPECTOMY W/RADIATION   DM (diabetes mellitus) ([place])    diet controlled   Flank pain    GERD (gastroesophageal reflux disease)    Hematuria, microscopic    Hydronephrosis    Hypertension    Over weight    Personal history of radiation therapy    Urethral stricture    Urge incontinence    Vaginal yeast infection     Past Surgical History:  Procedure Laterality Date   ABDOMINAL HYSTERECTOMY     AMPUTATION TOE Left 06/22/2019   Procedure: AMPUTATION TOE MPJ 3RD LEFT;  Surgeon: Samara Deist, DPM;  Location: [facility] ORS;  Service: Podiatry;  Laterality: Left;   APPENDECTOMY     [place] flap ureteroscopy     BREAST BIOPSY Right 2011   +   BREAST BIOPSY Right 01/05/2017   path pending   BREAST EXCISIONAL BIOPSY Right 03/15/2017   CATARACT EXTRACTION, BILATERAL     CHOLECYSTECTOMY     FOOT SURGERY Right    GALLBLADDER SURGERY     PARTIAL MASTECTOMY WITH NEEDLE LOCALIZATION Right 03/15/2017   Procedure: PARTIAL MASTECTOMY WITH NEEDLE LOCALIZATION;  Surgeon: Leonie Green, MD;  Location: [facility] ORS;  Service: General;  Laterality: Right;   TONSILLECTOMY  reports that she has never smoked. She has never used smokeless tobacco. She reports that she does not drink alcohol and does not use drugs.  Allergies  Allergen Reactions   Ciprofloxacin Nausea And Vomiting    Rash also   Doxycycline Rash   Nitrofurantoin Monohyd Macro Rash   Penicillins Rash    Has taken recently without problems (in 2016) Did it involve swelling of the face/tongue/throat, SOB, or low BP? No Did it involve sudden or severe rash/hives, skin peeling, or any reaction on the  inside of your mouth or nose? Unknown Did you need to seek medical attention at a hospital or doctor's office? Yes When did it last happen?      2016 If all above answers are "NO", may proceed with cephalosporin use.  Other reaction(s): Unknown Has taken recently without problems Has taken recently without problems (in 2016) Did it involve swelling of the face/tongue/throat, SOB, or low BP? No Did it involve sudden or severe rash/hives, skin peeling, or any reaction on the inside of your mouth or nose? Unknown Did you need to seek medical attention at a hospital or doctor's office? Yes When did it last happen?      2016 If all above answers are "NO", may proceed with cephalosporin use.   Pseudoephedrine Hcl Other (See Comments)    Insomnia    Sulfa Antibiotics Rash    Makes patient extremely hyper   Terazosin Other (See Comments)    unknown    Family History  Problem Relation Age of Onset   Diabetes Other    Heart disease Other    Breast cancer Cousin    Kidney disease Neg Hx    Bladder Cancer Neg Hx       Prior to Admission medications   Medication Sig Start Date End Date Taking? Authorizing Provider  amitriptyline (ELAVIL) 50 MG tablet Take 50 mg by mouth at bedtime.  09/23/14  Yes [provider]  atorvastatin (LIPITOR) 10 MG tablet Take 10 mg by mouth every evening.  09/23/14  Yes [provider]  gabapentin (NEURONTIN) 300 MG capsule Take 600 mg by mouth 3 (three) times daily.  12/11/14  Yes [provider]  valsartan-hydrochlorothiazide (DIOVAN-HCT) 80-12.5 MG tablet Take 1 tablet by mouth daily. 04/02/19  Yes [provider]  clindamycin (CLEOCIN) 150 MG capsule Take 600 mg by mouth See admin instructions. FOR DENTAL PROCEDURES Patient not taking: Reported on 05/07/2021 12/07/16   [provider]    Physical Exam: Vitals:   05/07/21 0730 05/07/21 0800 05/07/21 0800 05/07/21 0846  BP: 140/83 (!) 148/98    Pulse: 89 (!) 39 91  88  Resp: 17 (!) 22 17 20   Temp:      TempSrc:      SpO2: 95% 96% 96% 98%  Weight:      Height:         Vitals:   05/07/21 0730 05/07/21 0800 05/07/21 0800 05/07/21 0846  BP: 140/83 (!) 148/98    Pulse: 89 (!) 39 91 88  Resp: 17 (!) 22 17 20   Temp:      TempSrc:      SpO2: 95% 96% 96% 98%  Weight:      Height:          Constitutional: Alert and oriented x 3 . Not in any apparent distress HEENT:      Head: Normocephalic and atraumatic.         Eyes: PERLA, EOMI, Conjunctivae are normal.  Sclera is non-icteric.       Mouth/Throat: Mucous membranes are dry.       Neck: Supple with no signs of meningismus. Cardiovascular: Regular rate and rhythm. No murmurs, gallops, or rubs. 2+ symmetrical distal pulses are present . No JVD. No LE edema Respiratory: Respiratory effort normal .Lungs sounds clear bilaterally. No wheezes, crackles, or rhonchi.  Gastrointestinal: Soft, non tender, and non distended with positive bowel sounds.  Genitourinary: No CVA tenderness. Musculoskeletal: Nontender with normal range of motion in all extremities.  Ecchymosis involving both lower extremities (Lt > Rt) from prior falls Neurologic:  Face is symmetric. Moving all extremities. No gross focal neurologic deficits .  Generalized weakness Skin: Skin is warm, dry.  Bruising involving both lower extremities as well as both elbows. Psychiatric: Depressed mood flat affect   Labs on Admission: I have personally reviewed following labs and imaging studies  CBC: Recent Labs  Lab 05/06/21 1932  WBC 7.1  HGB 12.1  HCT 37.2  MCV 95.9  PLT 443   Basic Metabolic Panel: Recent Labs  Lab 05/06/21 1932  NA 139  K 3.8  CL 109  CO2 25  GLUCOSE 128*  BUN 24*  CREATININE 0.88  CALCIUM 8.9   GFR: Estimated Creatinine Clearance: 58.1 mL/min (by C-G formula based on SCr of 0.88 mg/dL). Liver Function Tests: No results for input(s): AST, ALT, ALKPHOS, BILITOT, PROT, ALBUMIN in the last 168 hours. No  results for input(s): LIPASE, AMYLASE in the last 168 hours. No results for input(s): AMMONIA in the last 168 hours. Coagulation Profile: No results for input(s): INR, PROTIME in the last 168 hours. Cardiac Enzymes: No results for input(s): CKTOTAL, CKMB, CKMBINDEX, TROPONINI in the last 168 hours. BNP (last 3 results) No results for input(s): PROBNP in the last 8760 hours. HbA1C: No results for input(s): HGBA1C in the last 72 hours. CBG: No results for input(s): GLUCAP in the last 168 hours. Lipid Profile: No results for input(s): CHOL, HDL, LDLCALC, TRIG, CHOLHDL, LDLDIRECT in the last 72 hours. Thyroid Function Tests: No results for input(s): TSH, T4TOTAL, FREET4, T3FREE, THYROIDAB in the last 72 hours. Anemia Panel: No results for input(s): VITAMINB12, FOLATE, FERRITIN, TIBC, IRON, RETICCTPCT in the last 72 hours. Urine analysis:    Component Value Date/Time   COLORURINE YELLOW (A) 05/06/2021 0408   APPEARANCEUR CLOUDY (A) 05/06/2021 0408   APPEARANCEUR Clear 08/27/2015 1415   LABSPEC 1.019 05/06/2021 0408   PHURINE 8.0 05/06/2021 0408   GLUCOSEU NEGATIVE 05/06/2021 0408   HGBUR NEGATIVE 05/06/2021 0408   BILIRUBINUR NEGATIVE 05/06/2021 0408   BILIRUBINUR Negative 08/27/2015 1415   KETONESUR 5 (A) 05/06/2021 0408   PROTEINUR 100 (A) 05/06/2021 0408   NITRITE POSITIVE (A) 05/06/2021 0408   LEUKOCYTESUR MODERATE (A) 05/06/2021 0408    Radiological Exams on Admission: DG Chest 2 View  Result Date: 05/06/2021 CLINICAL DATA:  Hypoxia. EXAM: CHEST - 2 VIEW COMPARISON:  Chest radiograph dated 06/14/2017. FINDINGS: There is mild elevation of left hemidiaphragm. Bilateral mid to lower lung field subpleural hazy and streaky densities, likely scarring or sequela prior inflammatory/infectious process. Developing infiltrate is less likely. Clinical correlation is recommended. No focal consolidation, pleural effusion or pneumothorax. Cardiac silhouette is within limits. Atherosclerotic  calcification of the aorta. Degenerative changes of the spine. No acute osseous pathology. IMPRESSION: Bilateral mid to lower lung field subpleural hazy and streaky densities, likely scarring or sequela prior inflammatory/infectious process. Electronically Signed   By: Anner Crete M.D.   On: 05/06/2021 20:06  DG Tibia/Fibula Left  Result Date: 05/07/2021 CLINICAL DATA:  Recent falls with right leg pain EXAM: LEFT TIBIA AND FIBULA - 2 VIEW COMPARISON:  None. FINDINGS: Subcutaneous reticulation especially seen at the lateral leg. Total knee arthroplasty which is well seated. Generalized osteopenia. No acute fracture or subluxation. Midfoot collapse, although a nonweightbearing lateral view. IMPRESSION: Soft tissue swelling without acute osseous finding. Electronically Signed   By: Jorje Guild M.D.   On: 05/07/2021 04:54   CT HEAD WO CONTRAST  Addendum Date: 05/06/2021   ADDENDUM REPORT: 05/06/2021 20:27 ADDENDUM: Scattered small nodules within the thyroid. Not clinically significant; no follow-up imaging recommended (ref: J Am Coll Radiol. 2015 Feb;12(2): 143-50). Electronically Signed   By: Inez Catalina M.D.   On: 05/06/2021 20:27   Result Date: 05/06/2021 CLINICAL DATA:  Headaches and neck pain, initial encounter EXAM: CT HEAD WITHOUT CONTRAST CT CERVICAL SPINE WITHOUT CONTRAST TECHNIQUE: Multidetector CT imaging of the head and cervical spine was performed following the standard protocol without intravenous contrast. Multiplanar CT image reconstructions of the cervical spine were also generated. COMPARISON:  05/04/2020 FINDINGS: CT HEAD FINDINGS Brain: No evidence of acute infarction, hemorrhage, hydrocephalus, extra-axial collection or mass lesion/mass effect. Mild atrophic changes and chronic white matter ischemic changes are noted. Vascular: No hyperdense vessel or unexpected calcification. Skull: Normal. Negative for fracture or focal lesion. Sinuses/Orbits: Opacification of the right  maxillary antrum is noted which is chronic in nature. Other: None CT CERVICAL SPINE FINDINGS Alignment: Within normal limits. Skull base and vertebrae: Cervical segments are well visualized. Vertebral body height is well maintained. Osteophytic changes are noted worst at the C5-6 and C6-7 levels. Chronic degenerative changes are noted at the C1-C2 articulation. Facet hypertrophic changes are noted throughout the cervical spine. No acute fracture or acute facet abnormality is noted. Multilevel neural foraminal narrowing is seen. Soft tissues and spinal canal: Surrounding soft tissue structures demonstrate a few small nodules within the thyroid. These measure less than 6 mm. No other focal soft tissue abnormality is noted. Upper chest: Visualized lung apices are within normal limits. Other: None IMPRESSION: CT of the head: Atrophic and ischemic changes without acute abnormality. CT of the cervical spine: Multilevel degenerative change without acute abnormality. Electronically Signed: By: Inez Catalina M.D. On: 05/06/2021 20:05   CT Cervical Spine Wo Contrast  Addendum Date: 05/06/2021   ADDENDUM REPORT: 05/06/2021 20:27 ADDENDUM: Scattered small nodules within the thyroid. Not clinically significant; no follow-up imaging recommended (ref: J Am Coll Radiol. 2015 Feb;12(2): 143-50). Electronically Signed   By: Inez Catalina M.D.   On: 05/06/2021 20:27   Result Date: 05/06/2021 CLINICAL DATA:  Headaches and neck pain, initial encounter EXAM: CT HEAD WITHOUT CONTRAST CT CERVICAL SPINE WITHOUT CONTRAST TECHNIQUE: Multidetector CT imaging of the head and cervical spine was performed following the standard protocol without intravenous contrast. Multiplanar CT image reconstructions of the cervical spine were also generated. COMPARISON:  05/04/2020 FINDINGS: CT HEAD FINDINGS Brain: No evidence of acute infarction, hemorrhage, hydrocephalus, extra-axial collection or mass lesion/mass effect. Mild atrophic changes and  chronic white matter ischemic changes are noted. Vascular: No hyperdense vessel or unexpected calcification. Skull: Normal. Negative for fracture or focal lesion. Sinuses/Orbits: Opacification of the right maxillary antrum is noted which is chronic in nature. Other: None CT CERVICAL SPINE FINDINGS Alignment: Within normal limits. Skull base and vertebrae: Cervical segments are well visualized. Vertebral body height is well maintained. Osteophytic changes are noted worst at the C5-6 and C6-7 levels. Chronic degenerative changes are  noted at the C1-C2 articulation. Facet hypertrophic changes are noted throughout the cervical spine. No acute fracture or acute facet abnormality is noted. Multilevel neural foraminal narrowing is seen. Soft tissues and spinal canal: Surrounding soft tissue structures demonstrate a few small nodules within the thyroid. These measure less than 6 mm. No other focal soft tissue abnormality is noted. Upper chest: Visualized lung apices are within normal limits. Other: None IMPRESSION: CT of the head: Atrophic and ischemic changes without acute abnormality. CT of the cervical spine: Multilevel degenerative change without acute abnormality. Electronically Signed: By: Inez Catalina M.D. On: 05/06/2021 20:05   DG Knee Complete 4 Views Right  Result Date: 05/07/2021 CLINICAL DATA:  Recent falls.  Right leg pain EXAM: RIGHT KNEE - COMPLETE 4+ VIEW COMPARISON:  09/22/2006 FINDINGS: No evidence of fracture, dislocation, or large joint effusion. Total knee arthroplasty which is well seated. IMPRESSION: No acute finding.  Unremarkable total knee arthroplasty. Electronically Signed   By: Jorje Guild M.D.   On: 05/07/2021 04:53     Assessment/Plan Principal Problem:   UTI (urinary tract infection) Active Problems:   DM (diabetes mellitus) ([place])   Hypertension   Frequent falls      Patient is an 85 year old female who presents to the ER for evaluation of frequent falls.    Urinary  tract infection Patient has pyuria Prior urine cultures-yield Proteus mirabilis sensitive to cephalosporins Continue antibiotic therapy with Rocephin until urine culture results become available    Frequent falls Appears to be mechanical falls Place patient on fall precautions Will obtain PT consult She will most likely require subacute rehab upon discharge     Diabetes mellitus Maintain consistent carbohydrate diet Check blood sugars AC meals     Hypertension Blood pressure stable Continue valsartan/HCTZ   DVT prophylaxis: Lovenox  Code Status: full code  Family Communication: Greater than 50% of time was spent discussing patient's condition and plan of care with her at the bedside.  All questions and concerns have been addressed.  She verbalizes understanding and agrees with the plan.  She lists her son as her healthcare power of attorney. Disposition Plan: Back to previous home environment Consults called: Physical therapy Status:At the time of admission, it appears that the appropriate admission status for this patient is inpatient. This is judged to be reasonable and necessary to provide the required intensity of service to ensure the patient's safety given the presenting symptoms, physical exam findings, and initial radiographic and laboratory data in the context of their comorbid conditions. Patient requires inpatient status due to high intensity of service, high risk for further deterioration and high frequency of surveillance required.     Collier Bullock MD [facility]     05/07/2021, 10:02 AM

## 2021-05-07 NOTE — ED Notes (Signed)
Pt awake, oriented to self, situation, and place. vss

## 2021-05-07 NOTE — ED Notes (Signed)
Patient transported to X-ray 

## 2021-05-07 NOTE — ED Provider Notes (Signed)
[facility] Emergency Department Provider Note ____________________________________________   Event Date/Time   First MD Initiated Contact with Patient 05/07/21 [phone]     (approximate)  I have reviewed the triage vital signs and the nursing notes.   HISTORY  Chief Complaint Fall    HPI Danielle Nixon is a 85 y.o. female with history of breast cancer status postlumpectomy and radiation, hypertension, diabetes who presents to the emergency department with her son after she has had several falls over the past few days.  Patient states she is not sure why she falls but thinks that she is losing her balance.  She denies any preceding chest pain, shortness of breath, dizziness.  No recent fevers, cough, vomiting or diarrhea.  States she has a cane and walker at home and uses them never she has to get up.  Son reports during one of her fall she did hit her head but did not lose consciousness.  She has not on any blood thinners.  She lives at home alone.         Past Medical History:  Diagnosis Date   Arthritis    hands   Atrophic vaginitis    Breast cancer ([place]) 2011   RT LUMPECTOMY W/RADIATION   DM (diabetes mellitus) ([place])    diet controlled   Flank pain    GERD (gastroesophageal reflux disease)    Hematuria, microscopic    Hydronephrosis    Hypertension    Over weight    Personal history of radiation therapy    Urethral stricture    Urge incontinence    Vaginal yeast infection     Patient Active Problem List   Diagnosis Date Noted   Carcinoma of overlapping sites of right breast in female, estrogen receptor positive ([place]) 10/06/2016   Urge incontinence 06/17/2015   Atrophic vaginitis 06/17/2015   H/O malignant neoplasm of breast 07/18/2013    Past Surgical History:  Procedure Laterality Date   ABDOMINAL HYSTERECTOMY     AMPUTATION TOE Left 06/22/2019   Procedure: AMPUTATION TOE MPJ 3RD LEFT;  Surgeon: Samara Deist, DPM;  Location: [facility]  ORS;  Service: Podiatry;  Laterality: Left;   APPENDECTOMY     [place] flap ureteroscopy     BREAST BIOPSY Right 2011   +   BREAST BIOPSY Right 01/05/2017   path pending   BREAST EXCISIONAL BIOPSY Right 03/15/2017   CATARACT EXTRACTION, BILATERAL     CHOLECYSTECTOMY     FOOT SURGERY Right    GALLBLADDER SURGERY     PARTIAL MASTECTOMY WITH NEEDLE LOCALIZATION Right 03/15/2017   Procedure: PARTIAL MASTECTOMY WITH NEEDLE LOCALIZATION;  Surgeon: Leonie Green, MD;  Location: [facility] ORS;  Service: General;  Laterality: Right;   TONSILLECTOMY      Prior to Admission medications   Medication Sig Start Date End Date Taking? Authorizing Provider  amitriptyline (ELAVIL) 50 MG tablet Take 50 mg by mouth at bedtime.  09/23/14   [provider]  atorvastatin (LIPITOR) 10 MG tablet Take 10 mg by mouth every evening.  09/23/14   [provider]  clindamycin (CLEOCIN) 150 MG capsule Take 600 mg by mouth See admin instructions. FOR DENTAL PROCEDURES 12/07/16   [provider]  gabapentin (NEURONTIN) 300 MG capsule Take 600 mg by mouth 3 (three) times daily.  12/11/14   [provider]  valsartan-hydrochlorothiazide (DIOVAN-HCT) 80-12.5 MG tablet Take 1 tablet by mouth daily. 04/02/19   [provider]    Allergies Ciprofloxacin, Doxycycline,  Nitrofurantoin monohyd macro, Penicillins, Pseudoephedrine hcl, Sulfa antibiotics, and Terazosin  Family History  Problem Relation Age of Onset   Diabetes Other    Heart disease Other    Breast cancer Cousin    Kidney disease Neg Hx    Bladder Cancer Neg Hx     Social History Social History   Tobacco Use   Smoking status: Never   Smokeless tobacco: Never  Vaping Use   Vaping Use: Never used  Substance Use Topics   Alcohol use: No    Alcohol/week: 0.0 standard drinks   Drug use: No    Review of Systems Constitutional: No fever. Eyes: No visual changes. ENT: No sore throat. Cardiovascular: Denies  chest pain. Respiratory: Denies shortness of breath. Gastrointestinal: No nausea, vomiting, diarrhea. Genitourinary: Negative for dysuria. Musculoskeletal: Negative for back pain. Skin: Negative for rash. Neurological: Negative for focal weakness or numbness.   ____________________________________________   PHYSICAL EXAM:  VITAL SIGNS: ED Triage Vitals  Enc Vitals Group     BP 05/06/21 1914 (!) 162/90     Pulse Rate 05/06/21 1914 (!) 109     Resp 05/06/21 1914 18     Temp 05/06/21 1914 98.2 F (36.8 C)     Temp Source 05/06/21 1914 Oral     SpO2 05/06/21 1914 (!) 89 %     Weight 05/06/21 1914 200 lb (90.7 kg)     Height 05/06/21 1914 5\' 9"  (1.753 m)     Head Circumference --      Peak Flow --      Pain Score 05/06/21 1918 7     Pain Loc --      Pain Edu? --      Excl. in [place]? --    CONSTITUTIONAL: Alert and oriented and responds appropriately to questions. Well-appearing; well-nourished; GCS 15 HEAD: Normocephalic; atraumatic EYES: Conjunctivae clear, PERRL, EOMI ENT: normal nose; no rhinorrhea; moist mucous membranes; pharynx without lesions noted; no dental injury; no septal hematoma NECK: Supple, no meningismus, no LAD; no midline spinal tenderness, step-off or deformity; trachea midline CARD: RRR; S1 and S2 appreciated; no murmurs, no clicks, no rubs, no gallops RESP: Normal chest excursion without splinting or tachypnea; breath sounds clear and equal bilaterally; no wheezes, no rhonchi, no rales; no hypoxia or respiratory distress CHEST:  chest wall stable, no crepitus or ecchymosis or deformity, nontender to palpation; no flail chest ABD/GI: Normal bowel sounds; non-distended; soft, non-tender, no rebound, no guarding; no ecchymosis or other lesions noted PELVIS:  stable, nontender to palpation BACK:  The back appears normal and is non-tender to palpation, there is no CVA tenderness; no midline spinal tenderness, step-off or deformity EXT: Multiple areas of ecchymosis  noted to her torso and extremities.  She has significant bruising to the left anterior shin and abrasion and bruising to the right knee without deformity.  She reports no pain in this area she has neuropathy.  Extremities are warm and well-perfused.  She has an older appearing bruise to the right posterior upper arm and to the left flank without any bony tenderness in these areas.  Compartments are soft.  No joint effusions. SKIN: Normal color for age and race; warm NEURO: Moves all extremities equally, no drift, normal sensation diffusely, cranial nerves II through XII intact, normal speech PSYCH: The patient's mood and manner are appropriate. Grooming and personal hygiene are appropriate.  ____________________________________________   LABS (all labs ordered are listed, but only abnormal results are displayed)  Labs Reviewed  BASIC  METABOLIC PANEL - Abnormal; Notable for the following components:      Result Value   Glucose, Bld 128 (*)    BUN 24 (*)    All other components within normal limits  URINALYSIS, COMPLETE (UACMP) WITH MICROSCOPIC - Abnormal; Notable for the following components:   Color, Urine YELLOW (*)    APPearance CLOUDY (*)    Ketones, ur 5 (*)    Protein, ur 100 (*)    Nitrite POSITIVE (*)    Leukocytes,Ua MODERATE (*)    WBC, UA >50 (*)    Bacteria, UA MANY (*)    All other components within normal limits  URINE CULTURE  RESP PANEL BY RT-PCR (FLU A&B, COVID) ARPGX2  CBC  TROPONIN I (HIGH SENSITIVITY)  TROPONIN I (HIGH SENSITIVITY)   ____________________________________________  EKG   Date: 05/07/2021 5:15 AM  Rate: 96  Rhythm: normal sinus rhythm  QRS Axis: Right axis deviation  Intervals: normal  ST/T Wave abnormalities: normal  Conduction Disutrbances: none  Narrative Interpretation: unremarkable, low voltage QRS, possible left atrial enlargement    ____________________________________________  RADIOLOGY I, Evangelina Delancey, personally viewed and  evaluated these images (plain radiographs) as part of my medical decision making, as well as reviewing the written report by the radiologist.  ED MD interpretation: Chest x-ray shows chronic changes.  No infiltrate.  X-ray of the left tibia and fibula and right knee show no acute abnormality other than soft tissue swelling.  CT head and cervical spine Negative for acute traumatic injury.  Official radiology report(s): DG Chest 2 View  Result Date: 05/06/2021 CLINICAL DATA:  Hypoxia. EXAM: CHEST - 2 VIEW COMPARISON:  Chest radiograph dated 06/14/2017. FINDINGS: There is mild elevation of left hemidiaphragm. Bilateral mid to lower lung field subpleural hazy and streaky densities, likely scarring or sequela prior inflammatory/infectious process. Developing infiltrate is less likely. Clinical correlation is recommended. No focal consolidation, pleural effusion or pneumothorax. Cardiac silhouette is within limits. Atherosclerotic calcification of the aorta. Degenerative changes of the spine. No acute osseous pathology. IMPRESSION: Bilateral mid to lower lung field subpleural hazy and streaky densities, likely scarring or sequela prior inflammatory/infectious process. Electronically Signed   By: Anner Crete M.D.   On: 05/06/2021 20:06   DG Tibia/Fibula Left  Result Date: 05/07/2021 CLINICAL DATA:  Recent falls with right leg pain EXAM: LEFT TIBIA AND FIBULA - 2 VIEW COMPARISON:  None. FINDINGS: Subcutaneous reticulation especially seen at the lateral leg. Total knee arthroplasty which is well seated. Generalized osteopenia. No acute fracture or subluxation. Midfoot collapse, although a nonweightbearing lateral view. IMPRESSION: Soft tissue swelling without acute osseous finding. Electronically Signed   By: Jorje Guild M.D.   On: 05/07/2021 04:54   CT HEAD WO CONTRAST  Addendum Date: 05/06/2021   ADDENDUM REPORT: 05/06/2021 20:27 ADDENDUM: Scattered small nodules within the thyroid. Not  clinically significant; no follow-up imaging recommended (ref: J Am Coll Radiol. 2015 Feb;12(2): 143-50). Electronically Signed   By: Inez Catalina M.D.   On: 05/06/2021 20:27   Result Date: 05/06/2021 CLINICAL DATA:  Headaches and neck pain, initial encounter EXAM: CT HEAD WITHOUT CONTRAST CT CERVICAL SPINE WITHOUT CONTRAST TECHNIQUE: Multidetector CT imaging of the head and cervical spine was performed following the standard protocol without intravenous contrast. Multiplanar CT image reconstructions of the cervical spine were also generated. COMPARISON:  05/04/2020 FINDINGS: CT HEAD FINDINGS Brain: No evidence of acute infarction, hemorrhage, hydrocephalus, extra-axial collection or mass lesion/mass effect. Mild atrophic changes and chronic white matter ischemic changes  are noted. Vascular: No hyperdense vessel or unexpected calcification. Skull: Normal. Negative for fracture or focal lesion. Sinuses/Orbits: Opacification of the right maxillary antrum is noted which is chronic in nature. Other: None CT CERVICAL SPINE FINDINGS Alignment: Within normal limits. Skull base and vertebrae: Cervical segments are well visualized. Vertebral body height is well maintained. Osteophytic changes are noted worst at the C5-6 and C6-7 levels. Chronic degenerative changes are noted at the C1-C2 articulation. Facet hypertrophic changes are noted throughout the cervical spine. No acute fracture or acute facet abnormality is noted. Multilevel neural foraminal narrowing is seen. Soft tissues and spinal canal: Surrounding soft tissue structures demonstrate a few small nodules within the thyroid. These measure less than 6 mm. No other focal soft tissue abnormality is noted. Upper chest: Visualized lung apices are within normal limits. Other: None IMPRESSION: CT of the head: Atrophic and ischemic changes without acute abnormality. CT of the cervical spine: Multilevel degenerative change without acute abnormality. Electronically Signed:  By: Inez Catalina M.D. On: 05/06/2021 20:05   CT Cervical Spine Wo Contrast  Addendum Date: 05/06/2021   ADDENDUM REPORT: 05/06/2021 20:27 ADDENDUM: Scattered small nodules within the thyroid. Not clinically significant; no follow-up imaging recommended (ref: J Am Coll Radiol. 2015 Feb;12(2): 143-50). Electronically Signed   By: Inez Catalina M.D.   On: 05/06/2021 20:27   Result Date: 05/06/2021 CLINICAL DATA:  Headaches and neck pain, initial encounter EXAM: CT HEAD WITHOUT CONTRAST CT CERVICAL SPINE WITHOUT CONTRAST TECHNIQUE: Multidetector CT imaging of the head and cervical spine was performed following the standard protocol without intravenous contrast. Multiplanar CT image reconstructions of the cervical spine were also generated. COMPARISON:  05/04/2020 FINDINGS: CT HEAD FINDINGS Brain: No evidence of acute infarction, hemorrhage, hydrocephalus, extra-axial collection or mass lesion/mass effect. Mild atrophic changes and chronic white matter ischemic changes are noted. Vascular: No hyperdense vessel or unexpected calcification. Skull: Normal. Negative for fracture or focal lesion. Sinuses/Orbits: Opacification of the right maxillary antrum is noted which is chronic in nature. Other: None CT CERVICAL SPINE FINDINGS Alignment: Within normal limits. Skull base and vertebrae: Cervical segments are well visualized. Vertebral body height is well maintained. Osteophytic changes are noted worst at the C5-6 and C6-7 levels. Chronic degenerative changes are noted at the C1-C2 articulation. Facet hypertrophic changes are noted throughout the cervical spine. No acute fracture or acute facet abnormality is noted. Multilevel neural foraminal narrowing is seen. Soft tissues and spinal canal: Surrounding soft tissue structures demonstrate a few small nodules within the thyroid. These measure less than 6 mm. No other focal soft tissue abnormality is noted. Upper chest: Visualized lung apices are within normal limits.  Other: None IMPRESSION: CT of the head: Atrophic and ischemic changes without acute abnormality. CT of the cervical spine: Multilevel degenerative change without acute abnormality. Electronically Signed: By: Inez Catalina M.D. On: 05/06/2021 20:05   DG Knee Complete 4 Views Right  Result Date: 05/07/2021 CLINICAL DATA:  Recent falls.  Right leg pain EXAM: RIGHT KNEE - COMPLETE 4+ VIEW COMPARISON:  09/22/2006 FINDINGS: No evidence of fracture, dislocation, or large joint effusion. Total knee arthroplasty which is well seated. IMPRESSION: No acute finding.  Unremarkable total knee arthroplasty. Electronically Signed   By: Jorje Guild M.D.   On: 05/07/2021 04:53    ____________________________________________   PROCEDURES  Procedure(s) performed (including Critical Care):  Procedures    ____________________________________________   INITIAL IMPRESSION / ASSESSMENT AND PLAN / ED COURSE  As part of my medical decision making, I reviewed  the following data within the [place] History obtained from family, Nursing notes reviewed and incorporated, Labs reviewed , EKG interpreted , Old EKG reviewed, Old chart reviewed, Discussed with admitting physician , CTs and x-rays reviewed, and Notes from prior ED visits       Patient here with frequent falls over the past few days.  Son is concerned because she lives at home alone.  She has fallen despite using assistive devices with ambulation.  She denies any preceding symptoms that lead to her falls.  Her work-up thus far has been unremarkable with normal blood counts, electrolytes, troponin.  EKG pending.  Will obtain urinalysis to rule out UTI as the cause of her frequent falls.  CT head shows no intracranial abnormality.  Chest x-ray shows bilateral mid to lower lung hazy and streaky densities that are likely scarring or sequela of prior infection/inflammation.  Doubt that this is a new developing infiltrate especially given she  has no leukocytosis, fever or cough.  Will obtain x-rays of the right knee and left tibia/fibula.  She declines pain medication at this time.  ED PROGRESS  Patient does appear to have a urinary tract infection.  X-rays of the right knee and left tibia/fibula show no acute traumatic injury other than soft tissue swelling.  No previous urine cultures in our system or care everywhere for comparison.  Urine culture pending today.  Will give IV Rocephin and discussed with hospitalist for admission.  Patient and family at bedside are comfortable with this plan.  5:10 AM  Discussed patient's case with hospitalist, Dr. Sidney Ace.  I have recommended admission and patient (and family if present) agree with this plan. Admitting physician will place admission orders.   I reviewed all nursing notes, vitals, pertinent previous records and reviewed/interpreted all EKGs, lab and urine results, imaging (as available).  ____________________________________________   FINAL CLINICAL IMPRESSION(S) / ED DIAGNOSES  Final diagnoses:  Frequent falls  Injury of head, initial encounter  Acute UTI     ED Discharge Orders     None       *Please note:  Danielle Nixon was evaluated in Emergency Department on 05/07/2021 for the symptoms described in the history of present illness. She was evaluated in the context of the global COVID-19 pandemic, which necessitated consideration that the patient might be at risk for infection with the SARS-CoV-2 virus that causes COVID-19. Institutional protocols and algorithms that pertain to the evaluation of patients at risk for COVID-19 are in a state of rapid change based on information released by regulatory bodies including the CDC and federal and state organizations. These policies and algorithms were followed during the patient's care in the ED.  Some ED evaluations and interventions may be delayed as a result of limited staffing during and the pandemic.*   Note:  This  document was prepared using Dragon voice recognition software and may include unintentional dictation errors.    Jerrick Farve, Delice Bison, DO 05/07/21 [phone]

## 2021-05-08 DIAGNOSIS — N390 Urinary tract infection, site not specified: Secondary | ICD-10-CM | POA: Diagnosis not present

## 2021-05-08 DIAGNOSIS — D696 Thrombocytopenia, unspecified: Secondary | ICD-10-CM | POA: Diagnosis not present

## 2021-05-08 LAB — GLUCOSE, CAPILLARY
Glucose-Capillary: 147 mg/dL — ABNORMAL HIGH (ref 70–99)
Glucose-Capillary: 120 mg/dL — ABNORMAL HIGH (ref 70–99)
Glucose-Capillary: 142 mg/dL — ABNORMAL HIGH (ref 70–99)
Glucose-Capillary: 156 mg/dL — ABNORMAL HIGH (ref 70–99)

## 2021-05-08 LAB — CBC
HCT: 37.5 % (ref 36.0–46.0)
Hemoglobin: 12.4 g/dL (ref 12.0–15.0)
MCH: 31.4 pg (ref 26.0–34.0)
MCHC: 33.1 g/dL (ref 30.0–36.0)
MCV: 94.9 fL (ref 80.0–100.0)
Platelets: 149 10*3/uL — ABNORMAL LOW (ref 150–400)
RBC: 3.95 MIL/uL (ref 3.87–5.11)
RDW: 13.9 % (ref 11.5–15.5)
WBC: 7 10*3/uL (ref 4.0–10.5)
nRBC: 0 % (ref 0.0–0.2)

## 2021-05-08 LAB — BASIC METABOLIC PANEL
Anion gap: 9 (ref 5–15)
BUN: 19 mg/dL (ref 8–23)
CO2: 26 mmol/L (ref 22–32)
Calcium: 8.9 mg/dL (ref 8.9–10.3)
Chloride: 102 mmol/L (ref 98–111)
Creatinine, Ser: 1.03 mg/dL — ABNORMAL HIGH (ref 0.44–1.00)
GFR, Estimated: 54 mL/min — ABNORMAL LOW (ref >60)
Glucose, Bld: 203 mg/dL — ABNORMAL HIGH (ref 70–99)
Potassium: 3.4 mmol/L — ABNORMAL LOW (ref 3.5–5.1)
Sodium: 137 mmol/L (ref 135–145)

## 2021-05-08 MED ORDER — SODIUM CHLORIDE 0.9 % IV SOLN
1.0000 g | INTRAVENOUS | Status: AC
  Administered 2021-05-09 – 2021-05-13 (×5): 1 g via INTRAVENOUS
  Filled 2021-05-08 (×5): qty 1

## 2021-05-08 NOTE — Evaluation (Signed)
Physical Therapy Evaluation Patient Details Name: Danielle Nixon MRN: [id number] DOB: 08-23-1935 Today's Date: 05/08/2021  History of Present Illness  Danielle Nixon is a 85 y.o. female with history of breast cancer status postlumpectomy and radiation, hypertension, diabetes who presents to the emergency department with her son after she has had several falls over the past few days.   Clinical Impression  Pt admitted with above diagnosis. Pt received supine in bed agreeable to PT services. Reports home lay out, DME, PLOF, assist available to pt without difficulty. Pt reports ~2-3 falls/week on average with use of SPC. Reports LE's give out on her which she attributes to being less active since husband passed away in Oct 30, 2022. Otherwise is mod-I with SPC in home and performs all her ADL's/IADL's independently but does rely on son for meals daily. Does rely on minA+1 for transferring to EOB at torso and with HOB elevated. Able to stand with bed elevated to RW with max cuing for hand placement with poor carryover. Pt amb very slowly with shuffling steps with minguard for safety. Amb ~8' in room requesting seated rest when suddenly pt's LE's buckle requiring PT maxA+1 to keep pt from falling. Required pt to sit on PT's knee to prevent fall ultimately relying on maxA+3 to safely transfer pt so sitting EOB. Able to scoot minguard up to head of bed and return to supine with supervision and minA to scoot up in bed with LE bridge and SUE support on bed rail. Pt appears emotional with current state of function and frequent falls. Pt re-assured rehab will continue to work with pt to improve mobility safely. RN notified. Will benefit from STR due to significant strength/endurance deficits and safety deficits with RW use. Pt currently with functional limitations due to the deficits listed below (see PT Problem List). Pt will benefit from skilled PT to increase their independence and safety with mobility to allow  discharge to the venue listed below.     Recommendations for follow up therapy are one component of a multi-disciplinary discharge planning process, led by the attending physician.  Recommendations may be updated based on patient status, additional functional criteria and insurance authorization.  Follow Up Recommendations Skilled nursing-short term rehab (<3 hours/day)    Assistance Recommended at Discharge Intermittent Supervision/Assistance  Functional Status Assessment Patient has had a recent decline in their functional status and demonstrates the ability to make significant improvements in function in a reasonable and predictable amount of time.  Equipment Recommendations   (tbd by next venue of care)    Recommendations for Other Services       Precautions / Restrictions Precautions Precautions: Fall Restrictions Weight Bearing Restrictions: No      Mobility  Bed Mobility Overal bed mobility: Needs Assistance Bed Mobility: Supine to Sit;Sit to Supine     Supine to sit: Min assist;HOB elevated Sit to supine: Supervision   General bed mobility comments: Requires assist at SUE to sit EOB with [facility] elevated Patient Response: Cooperative  Transfers Overall transfer level: Needs assistance Equipment used: Rolling walker (2 wheels) Transfers: Sit to/from Stand Sit to Stand: Min guard;From elevated surface           General transfer comment: Requirs cuing for hand placement with poor carryover.    Ambulation/Gait Ambulation/Gait assistance: Min guard Gait Distance (Feet): 8 Feet Assistive device: Rolling walker (2 wheels) Gait Pattern/deviations: Narrow base of support;Trunk flexed;Shuffle;Decreased step length - right;Decreased stance time - left       General Gait  Details: Limited stance time on LLE due to foot pain. B knees buckled at 8' with RW requiring maxA from PT to prevent pt from falling. WHile sitting on PT knee requesting help, ultimately required  maxA+3 to safely transfer pt to bed.  Stairs            Wheelchair Mobility    Modified Rankin (Stroke Patients Only)       Balance Overall balance assessment: Needs assistance Sitting-balance support: Bilateral upper extremity supported;Feet supported Sitting balance-Leahy Scale: Good Sitting balance - Comments: ABle to attempt to don socks but limited hip ROM. Slight posterior lean but able to correct with minA. But able to reach within BOS without LOB Postural control: Posterior lean (with attempts to don socks)   Standing balance-Leahy Scale: Poor Standing balance comment: Heavy reliance of RW for support                             Pertinent Vitals/Pain Pain Assessment: Faces Faces Pain Scale: Hurts a little bit Pain Location: LE's from falls Pain Intervention(s): Monitored during session    Home Living Family/patient expects to be discharged to:: Private residence Living Arrangements: Alone Available Help at Discharge: Family;Available PRN/intermittently Type of Home: House Home Access: Stairs to enter Entrance Stairs-Rails: Can reach both Entrance Stairs-Number of Steps: 4-5   Home Layout: One level (has stairs down into den space but does not use them) Home Equipment: Shower seat - built Medical sales representative (2 wheels);Cane - single point      Prior Function Prior Level of Function : Needs assist       Physical Assist : Mobility (physical);ADLs (physical) Mobility (physical): Gait;Transfers;Stairs ADLs (physical): IADLs Mobility Comments: Relies on rails and SPC for ambulation and stairs within house. ADLs Comments: son gets meals delivered to pt each day (eats 2 meals; breakfast and dinner, no lunch)     Hand Dominance        Extremity/Trunk Assessment   Upper Extremity Assessment Upper Extremity Assessment: Generalized weakness    Lower Extremity Assessment Lower Extremity Assessment: Generalized weakness    Cervical / Trunk  Assessment Cervical / Trunk Assessment: Normal  Communication   Communication: No difficulties  Cognition Arousal/Alertness: Awake/alert Behavior During Therapy: WFL for tasks assessed/performed Overall Cognitive Status: Within Functional Limits for tasks assessed                                          General Comments      Exercises Other Exercises Other Exercises: Role of PT in acute setting, D/c recs, safe use of DME.   Assessment/Plan    PT Assessment Patient needs continued PT services  PT Problem List Decreased strength;Decreased activity tolerance;Decreased knowledge of use of DME;Decreased balance;Decreased safety awareness;Decreased mobility       PT Treatment Interventions DME instruction;Therapeutic exercise;Gait training;Balance training;Stair training;Neuromuscular re-education;Functional mobility training;Therapeutic activities;Patient/family education    PT Goals (Current goals can be found in the Care Plan section)  Acute Rehab PT Goals Patient Stated Goal: to return home PT Goal Formulation: With patient Time For Goal Achievement: 05/22/21 Potential to Achieve Goals: Poor    Frequency Min 2X/week   Barriers to discharge Decreased caregiver support;Inaccessible home environment stairs to enter home    Co-evaluation               AM-PAC PT "  6 Clicks" Mobility  Outcome Measure Help needed turning from your back to your side while in a flat bed without using bedrails?: A Little Help needed moving from lying on your back to sitting on the side of a flat bed without using bedrails?: A Little Help needed moving to and from a bed to a chair (including a wheelchair)?: A Little Help needed standing up from a chair using your arms (e.g., wheelchair or bedside chair)?: A Lot Help needed to walk in hospital room?: A Lot Help needed climbing 3-5 steps with a railing? : Total 6 Click Score: 14    End of Session Equipment Utilized During  Treatment: Gait belt Activity Tolerance: Other (comment) (near fall with PT from LE weakness) Patient left: in bed;with call bell/phone within reach;with bed alarm set Nurse Communication: Mobility status PT Visit Diagnosis: Unsteadiness on feet (R26.81);History of falling (Z91.81);Difficulty in walking, not elsewhere classified (R26.2);Muscle weakness (generalized) (M62.81);Other abnormalities of gait and mobility (R26.89)    Time: 1015-1050 PT Time Calculation (min) (ACUTE ONLY): 35 min   Charges:   PT Evaluation $PT Eval Low Complexity: 1 Low PT Treatments $Therapeutic Activity: 8-22 mins       Talynn Lebon M. Fairly IV, PT, DPT Physical Therapist- [facility]  05/08/2021, 11:56 AM

## 2021-05-08 NOTE — Progress Notes (Signed)
PROGRESS NOTE    Danielle Nixon  OVF:[id number] DOB: 1936/02/28 DOA: 05/07/2021 PCP: Baxter Hire, MD    Assessment & Plan:   Principal Problem:   UTI (urinary tract infection) Active Problems:   DM (diabetes mellitus) ([place])   Hypertension   Frequent falls   UTI: urine cx is growing gram neg rods, sens pending. Continue on IV rocephin    Frequent falls: likely mechanical in etiology. PT/OT consulted   DM2: well controlled, HbA1c 6.4. Diet controlled    HTN: continue on home dose of irbesartan & HCTZ   Thrombocytopenia: etiology unclear. Will continue to monitor      DVT prophylaxis: lovenox  Code Status: full  Family Communication: Disposition Plan: depends on PT/OT recs   Level of care: Med-Surg  Status is: Inpatient  Remains inpatient appropriate because: severity of illness, urine cx are pending still and PT/OT need to evaluate the pt as well      Consultants:    Procedures:   Antimicrobials: rocephin    Subjective: Pt c/o malaise   Objective: Vitals:   05/07/21 1954 05/08/21 0323 05/08/21 0400 05/08/21 0814  BP: (!) 161/98 (!) 150/81  (!) 157/81  Pulse: (!) 103 (!) 106  (!) 110  Resp: 20 20  18   Temp: 98.3 F (36.8 C) (!) 97.3 F (36.3 C)  97.6 F (36.4 C)  TempSrc: Oral Oral  Oral  SpO2: 94% 93%  95%  Weight:   89.5 kg   Height:        Intake/Output Summary (Last 24 hours) at 05/08/2021 1351 Last data filed at 05/08/2021 1009 Gross per 24 hour  Intake 300 ml  Output 650 ml  Net -350 ml   Filed Weights   05/06/21 1914 05/08/21 0400  Weight: 90.7 kg 89.5 kg    Examination:  General exam: Appears calm and comfortable  Respiratory system: Clear to auscultation. Respiratory effort normal. Cardiovascular system: S1 & S2 +. No rubs, gallops or clicks. No pedal edema. Gastrointestinal system: Abdomen is nondistended, soft and nontender.  Normal bowel sounds heard. Central nervous system: Alert and oriented. Moves all  extremities  Psychiatry: Judgement and insight appear normal. Mood & affect appropriate.     Data Reviewed: I have personally reviewed following labs and imaging studies  CBC: Recent Labs  Lab 05/06/21 1932 05/08/21 0907  WBC 7.1 7.0  HGB 12.1 12.4  HCT 37.2 37.5  MCV 95.9 94.9  PLT 159 841*   Basic Metabolic Panel: Recent Labs  Lab 05/06/21 1932 05/08/21 0907  NA 139 137  K 3.8 3.4*  CL 109 102  CO2 25 26  GLUCOSE 128* 203*  BUN 24* 19  CREATININE 0.88 1.03*  CALCIUM 8.9 8.9   GFR: Estimated Creatinine Clearance: 49.3 mL/min (A) (by C-G formula based on SCr of 1.03 mg/dL (H)). Liver Function Tests: No results for input(s): AST, ALT, ALKPHOS, BILITOT, PROT, ALBUMIN in the last 168 hours. No results for input(s): LIPASE, AMYLASE in the last 168 hours. No results for input(s): AMMONIA in the last 168 hours. Coagulation Profile: No results for input(s): INR, PROTIME in the last 168 hours. Cardiac Enzymes: No results for input(s): CKTOTAL, CKMB, CKMBINDEX, TROPONINI in the last 168 hours. BNP (last 3 results) No results for input(s): PROBNP in the last 8760 hours. HbA1C: No results for input(s): HGBA1C in the last 72 hours. CBG: Recent Labs  Lab 05/07/21 1747 05/08/21 0652 05/08/21 1204  GLUCAP 156* 147* 120*   Lipid Profile: No results  for input(s): CHOL, HDL, LDLCALC, TRIG, CHOLHDL, LDLDIRECT in the last 72 hours. Thyroid Function Tests: No results for input(s): TSH, T4TOTAL, FREET4, T3FREE, THYROIDAB in the last 72 hours. Anemia Panel: No results for input(s): VITAMINB12, FOLATE, FERRITIN, TIBC, IRON, RETICCTPCT in the last 72 hours. Sepsis Labs: No results for input(s): PROCALCITON, LATICACIDVEN in the last 168 hours.  Recent Results (from the past 240 hour(s))  Urine Culture     Status: Abnormal (Preliminary result)   Collection Time: 05/06/21  7:32 PM   Specimen: Urine, Random  Result Value Ref Range Status   Specimen Description   Final     URINE, RANDOM Performed at [facility], [address], [place], [place] [address]    Special Requests   Final    NONE Performed at [facility], [address] [place], [place] [address]    Culture (A)  Final    >=100,000 COLONIES/mL Lonell Grandchild NEGATIVE RODS SUSCEPTIBILITIES TO FOLLOW Performed at [facility], [place] [address], [place], [place] [address]    Report Status PENDING  Incomplete  Resp Panel by RT-PCR (Flu A&B, Covid) Nasopharyngeal Swab     Status: None   Collection Time: 05/07/21  5:48 AM   Specimen: Nasopharyngeal Swab; Nasopharyngeal(NP) swabs in vial transport medium  Result Value Ref Range Status   SARS Coronavirus 2 by RT PCR NEGATIVE NEGATIVE Final    Comment: (NOTE) SARS-CoV-2 target nucleic acids are NOT DETECTED.  The SARS-CoV-2 RNA is generally detectable in upper respiratory specimens during the acute phase of infection. The lowest concentration of SARS-CoV-2 viral copies this assay can detect is 138 copies/mL. A negative result does not preclude SARS-Cov-2 infection and should not be used as the sole basis for treatment or other patient management decisions. A negative result may occur with  improper specimen collection/handling, submission of specimen other than nasopharyngeal swab, presence of viral mutation(s) within the areas targeted by this assay, and inadequate number of viral copies(<138 copies/mL). A negative result must be combined with clinical observations, patient history, and epidemiological information. The expected result is Negative.  Fact Sheet for Patients:  EntrepreneurPulse.com.au  Fact Sheet for Healthcare Providers:  IncredibleEmployment.be  This test is no t yet approved or cleared by the [place] FDA and  has been authorized for detection and/or diagnosis of SARS-CoV-2 by FDA under an Emergency Use Authorization (EUA). This EUA will remain  in effect (meaning this  test can be used) for the duration of the COVID-19 declaration under Section 564(b)(1) of the Act, 21 U.S.C.section 360bbb-3(b)(1), unless the authorization is terminated  or revoked sooner.       Influenza A by PCR NEGATIVE NEGATIVE Final   Influenza B by PCR NEGATIVE NEGATIVE Final    Comment: (NOTE) The Xpert Xpress SARS-CoV-2/FLU/RSV plus assay is intended as an aid in the diagnosis of influenza from Nasopharyngeal swab specimens and should not be used as a sole basis for treatment. Nasal washings and aspirates are unacceptable for Xpert Xpress SARS-CoV-2/FLU/RSV testing.  Fact Sheet for Patients: EntrepreneurPulse.com.au  Fact Sheet for Healthcare Providers: IncredibleEmployment.be  This test is not yet approved or cleared by the [place] FDA and has been authorized for detection and/or diagnosis of SARS-CoV-2 by FDA under an Emergency Use Authorization (EUA). This EUA will remain in effect (meaning this test can be used) for the duration of the COVID-19 declaration under Section 564(b)(1) of the Act, 21 U.S.C. section 360bbb-3(b)(1), unless the authorization is terminated or revoked.  Performed at  [facility], [address] [place], [place] [address]          Radiology Studies: DG Chest 2 View  Result Date: 05/06/2021 CLINICAL DATA:  Hypoxia. EXAM: CHEST - 2 VIEW COMPARISON:  Chest radiograph dated 06/14/2017. FINDINGS: There is mild elevation of left hemidiaphragm. Bilateral mid to lower lung field subpleural hazy and streaky densities, likely scarring or sequela prior inflammatory/infectious process. Developing infiltrate is less likely. Clinical correlation is recommended. No focal consolidation, pleural effusion or pneumothorax. Cardiac silhouette is within limits. Atherosclerotic calcification of the aorta. Degenerative changes of the spine. No acute osseous pathology. IMPRESSION: Bilateral mid to lower lung  field subpleural hazy and streaky densities, likely scarring or sequela prior inflammatory/infectious process. Electronically Signed   By: Anner Crete M.D.   On: 05/06/2021 20:06   DG Tibia/Fibula Left  Result Date: 05/07/2021 CLINICAL DATA:  Recent falls with right leg pain EXAM: LEFT TIBIA AND FIBULA - 2 VIEW COMPARISON:  None. FINDINGS: Subcutaneous reticulation especially seen at the lateral leg. Total knee arthroplasty which is well seated. Generalized osteopenia. No acute fracture or subluxation. Midfoot collapse, although a nonweightbearing lateral view. IMPRESSION: Soft tissue swelling without acute osseous finding. Electronically Signed   By: Jorje Guild M.D.   On: 05/07/2021 04:54   CT HEAD WO CONTRAST  Addendum Date: 05/06/2021   ADDENDUM REPORT: 05/06/2021 20:27 ADDENDUM: Scattered small nodules within the thyroid. Not clinically significant; no follow-up imaging recommended (ref: J Am Coll Radiol. 2015 Feb;12(2): 143-50). Electronically Signed   By: Inez Catalina M.D.   On: 05/06/2021 20:27   Result Date: 05/06/2021 CLINICAL DATA:  Headaches and neck pain, initial encounter EXAM: CT HEAD WITHOUT CONTRAST CT CERVICAL SPINE WITHOUT CONTRAST TECHNIQUE: Multidetector CT imaging of the head and cervical spine was performed following the standard protocol without intravenous contrast. Multiplanar CT image reconstructions of the cervical spine were also generated. COMPARISON:  05/04/2020 FINDINGS: CT HEAD FINDINGS Brain: No evidence of acute infarction, hemorrhage, hydrocephalus, extra-axial collection or mass lesion/mass effect. Mild atrophic changes and chronic white matter ischemic changes are noted. Vascular: No hyperdense vessel or unexpected calcification. Skull: Normal. Negative for fracture or focal lesion. Sinuses/Orbits: Opacification of the right maxillary antrum is noted which is chronic in nature. Other: None CT CERVICAL SPINE FINDINGS Alignment: Within normal limits. Skull  base and vertebrae: Cervical segments are well visualized. Vertebral body height is well maintained. Osteophytic changes are noted worst at the C5-6 and C6-7 levels. Chronic degenerative changes are noted at the C1-C2 articulation. Facet hypertrophic changes are noted throughout the cervical spine. No acute fracture or acute facet abnormality is noted. Multilevel neural foraminal narrowing is seen. Soft tissues and spinal canal: Surrounding soft tissue structures demonstrate a few small nodules within the thyroid. These measure less than 6 mm. No other focal soft tissue abnormality is noted. Upper chest: Visualized lung apices are within normal limits. Other: None IMPRESSION: CT of the head: Atrophic and ischemic changes without acute abnormality. CT of the cervical spine: Multilevel degenerative change without acute abnormality. Electronically Signed: By: Inez Catalina M.D. On: 05/06/2021 20:05   CT Cervical Spine Wo Contrast  Addendum Date: 05/06/2021   ADDENDUM REPORT: 05/06/2021 20:27 ADDENDUM: Scattered small nodules within the thyroid. Not clinically significant; no follow-up imaging recommended (ref: J Am Coll Radiol. 2015 Feb;12(2): 143-50). Electronically Signed   By: Inez Catalina M.D.   On: 05/06/2021 20:27   Result Date: 05/06/2021 CLINICAL DATA:  Headaches and neck pain, initial encounter EXAM:  CT HEAD WITHOUT CONTRAST CT CERVICAL SPINE WITHOUT CONTRAST TECHNIQUE: Multidetector CT imaging of the head and cervical spine was performed following the standard protocol without intravenous contrast. Multiplanar CT image reconstructions of the cervical spine were also generated. COMPARISON:  05/04/2020 FINDINGS: CT HEAD FINDINGS Brain: No evidence of acute infarction, hemorrhage, hydrocephalus, extra-axial collection or mass lesion/mass effect. Mild atrophic changes and chronic white matter ischemic changes are noted. Vascular: No hyperdense vessel or unexpected calcification. Skull: Normal. Negative for  fracture or focal lesion. Sinuses/Orbits: Opacification of the right maxillary antrum is noted which is chronic in nature. Other: None CT CERVICAL SPINE FINDINGS Alignment: Within normal limits. Skull base and vertebrae: Cervical segments are well visualized. Vertebral body height is well maintained. Osteophytic changes are noted worst at the C5-6 and C6-7 levels. Chronic degenerative changes are noted at the C1-C2 articulation. Facet hypertrophic changes are noted throughout the cervical spine. No acute fracture or acute facet abnormality is noted. Multilevel neural foraminal narrowing is seen. Soft tissues and spinal canal: Surrounding soft tissue structures demonstrate a few small nodules within the thyroid. These measure less than 6 mm. No other focal soft tissue abnormality is noted. Upper chest: Visualized lung apices are within normal limits. Other: None IMPRESSION: CT of the head: Atrophic and ischemic changes without acute abnormality. CT of the cervical spine: Multilevel degenerative change without acute abnormality. Electronically Signed: By: Inez Catalina M.D. On: 05/06/2021 20:05   DG Knee Complete 4 Views Right  Result Date: 05/07/2021 CLINICAL DATA:  Recent falls.  Right leg pain EXAM: RIGHT KNEE - COMPLETE 4+ VIEW COMPARISON:  09/22/2006 FINDINGS: No evidence of fracture, dislocation, or large joint effusion. Total knee arthroplasty which is well seated. IMPRESSION: No acute finding.  Unremarkable total knee arthroplasty. Electronically Signed   By: Jorje Guild M.D.   On: 05/07/2021 04:53        Scheduled Meds:  amitriptyline  50 mg Oral QHS   atorvastatin  10 mg Oral QPM   enoxaparin (LOVENOX) injection  40 mg Subcutaneous Q24H   gabapentin  600 mg Oral TID   irbesartan  75 mg Oral Daily   And   hydrochlorothiazide  12.5 mg Oral Daily   Continuous Infusions:  cefTRIAXone (ROCEPHIN)  IV       LOS: 1 day    Time spent: 30 mins     Wyvonnia Dusky, MD [facility] Pager [phone]  If 7PM-7AM, please contact night-coverage 05/08/2021, 1:51 PM

## 2021-05-08 NOTE — Evaluation (Signed)
Occupational Therapy Evaluation Patient Details Name: Danielle Nixon MRN: [id number] DOB: [birth year] Today's Date: 05/08/2021   History of Present Illness Danielle Nixon is a 85 y.o. female with history of breast cancer status postlumpectomy and radiation, hypertension, diabetes who presents to the emergency department with her son after she has had several falls over the past few days.   Clinical Impression   [name] was seen for OT evaluation this date. Prior to hospital admission, pt was MOD I for mobility and ADLs using SPC, son provides meals daily. Pt lives alone with son available PRN (provides meals). Pt presents to acute OT demonstrating impaired ADL performance and functional mobility 2/2 decreased activity tolerance, functional strength/balance deficits, and poor insight into strengths. Pt currently requires MIN A don B socks at bed level - assist threading over toes. MAX A for bed<>chair squat pivot t/f. Pt reports need to have BM, refuses BSC t/f citing anxiousness, agreeable to return to bed for bedpan use (NT notified, states pt has used BSC earlier today). Pt appears to be self-limiting this PM, reports increased anxiety and increased B foot neuropathy pain. Pt would benefit from skilled OT to address noted impairments and functional limitations (see below for any additional details) in order to maximize safety and independence while minimizing falls risk and caregiver burden. Upon hospital discharge, recommend STR to maximize pt safety and return to PLOF.       Recommendations for follow up therapy are one component of a multi-disciplinary discharge planning process, led by the attending physician.  Recommendations may be updated based on patient status, additional functional criteria and insurance authorization.   Follow Up Recommendations  Skilled nursing-short term rehab (<3 hours/day)    Assistance Recommended at Discharge Frequent or constant Supervision/Assistance   Functional Status Assessment  Patient has had a recent decline in their functional status and demonstrates the ability to make significant improvements in function in a reasonable and predictable amount of time.  Equipment Recommendations  Other (comment) (defer to next venue of care)    Recommendations for Other Services       Precautions / Restrictions Precautions Precautions: Fall Restrictions Weight Bearing Restrictions: No      Mobility Bed Mobility Overal bed mobility: Needs Assistance Bed Mobility: Supine to Sit     Supine to sit: Min assist;HOB elevated Sit to supine: Supervision   General bed mobility comments: Requires assist at SUE to sit EOB with HOB elevated    Transfers Overall transfer level: Needs assistance Equipment used: Rolling walker (2 wheels) Transfers: Bed to chair/wheelchair/BSC Sit to Stand: Min guard;From elevated surface   Squat pivot transfers: Max assist       General transfer comment: pt limited by anxiety, defers standing and self-limits participation in transfers. MAX A bed<>chair      Balance Overall balance assessment: Needs assistance Sitting-balance support: No upper extremity supported;Feet supported Sitting balance-Leahy Scale: Good Sitting balance - Comments: ABle to attempt to don socks but limited hip ROM. Slight posterior lean but able to correct with minA. But able to reach within BOS without LOB Postural control: Posterior lean (with attempts to don socks) Standing balance support: Reliant on assistive device for balance;Bilateral upper extremity supported Standing balance-Leahy Scale: Zero Standing balance comment: self-initiates return to sitting after standing attempt                           ADL either performed or assessed with clinical judgement  ADL Overall ADL's : Needs assistance/impaired                                       General ADL Comments: MIN A don B socks at bed  level - assist threading over toes. MAX A for ADL t/f. SETUP seated grooming tasks     Pertinent Vitals/Pain Pain Assessment: 0-10 Pain Score: 7  Faces Pain Scale: Hurts a little bit Pain Location: B feet from neuropathy Pain Descriptors / Indicators: Discomfort;Dull;Grimacing Pain Intervention(s): Limited activity within patient's tolerance;Repositioned     Hand Dominance     Extremity/Trunk Assessment Upper Extremity Assessment Upper Extremity Assessment: Generalized weakness   Lower Extremity Assessment Lower Extremity Assessment: Generalized weakness   Cervical / Trunk Assessment Cervical / Trunk Assessment: Normal   Communication Communication Communication: No difficulties   Cognition Arousal/Alertness: Awake/alert Behavior During Therapy: WFL for tasks assessed/performed Overall Cognitive Status: Within Functional Limits for tasks assessed                                       General Comments       Exercises Exercises: Other exercises Other Exercises Other Exercises: Pt educated re: OT role, DME recs, d/c recs, falls prevention, ECS, HEP Other Exercises: LBD, sup<>sit, sit<>stand, sitting/standing balance/tolerance   Shoulder Instructions      Home Living Family/patient expects to be discharged to:: Private residence Living Arrangements: Alone Available Help at Discharge: Family;Available PRN/intermittently Type of Home: House Home Access: Stairs to enter [employer] of Steps: 4-5 Entrance Stairs-Rails: Can reach both Home Layout: One level (has stairs down into den space but does not use them)     Bathroom Shower/Tub: Occupational psychologist: Standard     Home Equipment: Shower seat - built Medical sales representative (2 wheels);Cane - single point          Prior Functioning/Environment Prior Level of Function : Needs assist       Physical Assist : Mobility (physical);ADLs (physical) Mobility (physical):  Gait;Transfers;Stairs ADLs (physical): IADLs Mobility Comments: Relies on rails and SPC for ambulation and stairs within house. ADLs Comments: son gets meals delivered to pt each day (eats 2 meals; breakfast and dinner, no lunch)        OT Problem List: Decreased strength;Decreased range of motion;Decreased activity tolerance;Impaired balance (sitting and/or standing);Decreased safety awareness;Decreased knowledge of use of DME or AE;Decreased knowledge of precautions;Pain      OT Treatment/Interventions: Self-care/ADL training;Therapeutic exercise;Energy conservation;DME and/or AE instruction;Therapeutic activities;Patient/family education;Balance training    OT Goals(Current goals can be found in the care plan section) Acute Rehab OT Goals Patient Stated Goal: to return to PLOF and go home OT Goal Formulation: With patient Time For Goal Achievement: 05/22/21 Potential to Achieve Goals: Good ADL Goals Pt Will Perform Grooming: with mod assist;standing (c LRAD PRN) Pt Will Perform Lower Body Dressing: sitting/lateral leans;with supervision Pt Will Transfer to Toilet: with min assist;ambulating;bedside commode (c LRAD PRN)  OT Frequency: Min 2X/week   Barriers to D/C: Inaccessible home environment;Decreased caregiver support          Co-evaluation              AM-PAC OT "6 Clicks" Daily Activity     Outcome Measure Help from another person eating meals?: None Help from another person taking care  of personal grooming?: A Little Help from another person toileting, which includes using toliet, bedpan, or urinal?: A Lot Help from another person bathing (including washing, rinsing, drying)?: A Lot Help from another person to put on and taking off regular upper body clothing?: A Little Help from another person to put on and taking off regular lower body clothing?: A Lot 6 Click Score: 16   End of Session Equipment Utilized During Treatment: Gait belt Nurse Communication:  Mobility status (NT notifed pt left on bed pan)  Activity Tolerance: Patient tolerated treatment well Patient left: in bed;with call bell/phone within reach;with bed alarm set  OT Visit Diagnosis: Other abnormalities of gait and mobility (R26.89)                Time: 9977-4142 OT Time Calculation (min): 36 min Charges:  OT General Charges $OT Visit: 1 Visit OT Evaluation $OT Eval Low Complexity: 1 Low OT Treatments $Self Care/Home Management : 23-37 mins  Dessie Coma, M.S. OTR/L  05/08/21, 3:18 PM  ascom [phone]

## 2021-05-09 DIAGNOSIS — N390 Urinary tract infection, site not specified: Secondary | ICD-10-CM | POA: Diagnosis not present

## 2021-05-09 DIAGNOSIS — E876 Hypokalemia: Secondary | ICD-10-CM

## 2021-05-09 DIAGNOSIS — D696 Thrombocytopenia, unspecified: Secondary | ICD-10-CM | POA: Diagnosis not present

## 2021-05-09 LAB — URINE CULTURE: Culture: >=100000 — AB

## 2021-05-09 LAB — GLUCOSE, CAPILLARY
Glucose-Capillary: 144 mg/dL — ABNORMAL HIGH (ref 70–99)
Glucose-Capillary: 179 mg/dL — ABNORMAL HIGH (ref 70–99)
Glucose-Capillary: 192 mg/dL — ABNORMAL HIGH (ref 70–99)

## 2021-05-09 LAB — BASIC METABOLIC PANEL
Anion gap: 7 (ref 5–15)
BUN: 20 mg/dL (ref 8–23)
CO2: 28 mmol/L (ref 22–32)
Calcium: 9 mg/dL (ref 8.9–10.3)
Chloride: 103 mmol/L (ref 98–111)
Creatinine, Ser: 0.73 mg/dL (ref 0.44–1.00)
GFR, Estimated: >60 mL/min (ref >60)
Glucose, Bld: 129 mg/dL — ABNORMAL HIGH (ref 70–99)
Potassium: 3.4 mmol/L — ABNORMAL LOW (ref 3.5–5.1)
Sodium: 138 mmol/L (ref 135–145)

## 2021-05-09 LAB — CBC
HCT: 36.8 % (ref 36.0–46.0)
Hemoglobin: 12 g/dL (ref 12.0–15.0)
MCH: 30.6 pg (ref 26.0–34.0)
MCHC: 32.6 g/dL (ref 30.0–36.0)
MCV: 93.9 fL (ref 80.0–100.0)
Platelets: 135 10*3/uL — ABNORMAL LOW (ref 150–400)
RBC: 3.92 MIL/uL (ref 3.87–5.11)
RDW: 13.8 % (ref 11.5–15.5)
WBC: 7.3 10*3/uL (ref 4.0–10.5)
nRBC: 0 % (ref 0.0–0.2)

## 2021-05-09 MED ORDER — METOPROLOL TARTRATE 5 MG/5ML IV SOLN
2.5000 mg | Freq: Once | INTRAVENOUS | Status: AC
  Administered 2021-05-09: 2.5 mg via INTRAVENOUS
  Filled 2021-05-09: qty 5

## 2021-05-09 MED ORDER — POTASSIUM CHLORIDE CRYS ER 20 MEQ PO TBCR
40.0000 meq | EXTENDED_RELEASE_TABLET | Freq: Once | ORAL | Status: AC
  Administered 2021-05-09: 40 meq via ORAL
  Filled 2021-05-09: qty 2

## 2021-05-09 MED ORDER — POLYETHYLENE GLYCOL 3350 17 G PO PACK
17.0000 g | PACK | Freq: Every day | ORAL | Status: DC
  Administered 2021-05-09 – 2021-05-19 (×11): 17 g via ORAL
  Filled 2021-05-09 (×11): qty 1

## 2021-05-09 MED ORDER — SODIUM CHLORIDE 0.9 % IV BOLUS
250.0000 mL | Freq: Once | INTRAVENOUS | Status: AC
  Administered 2021-05-09: 250 mL via INTRAVENOUS

## 2021-05-09 MED ORDER — DOCUSATE SODIUM 100 MG PO CAPS
200.0000 mg | ORAL_CAPSULE | Freq: Two times a day (BID) | ORAL | Status: DC
  Administered 2021-05-09 – 2021-05-13 (×9): 200 mg via ORAL
  Filled 2021-05-09 (×9): qty 2

## 2021-05-09 NOTE — Progress Notes (Signed)
PROGRESS NOTE    Danielle Nixon  LHT:[id number] DOB: [birth year] DOA: 05/07/2021 PCP: Baxter Hire, MD    Assessment & Plan:   Principal Problem:   UTI (urinary tract infection) Active Problems:   DM (diabetes mellitus) ([place])   Hypertension   Frequent falls   UTI: urine cx growing proteus species, sens pending. Continue on IV rocephin     Frequent falls: likely mechanical in etiology. PT/OT recs SNF   DM2: HbA1c 6.4, well controlled. Diet controlled    HTN: continue on home dose of HCTZ & irbesartan   Thrombocytopenia:  etiology unclear, labile   Hypokalemia: potassium given   DVT prophylaxis: lovenox  Code Status: full  Family Communication: called pt's son, Iona Beard, and no answer so I left a voicemail  Disposition Plan: likely d/c to SNF   Level of care: Med-Surg  Status is: Inpatient  Remains inpatient appropriate because: severity of illness, urine cx are pending still and will likely d/c to SNF      Consultants:    Procedures:   Antimicrobials: rocephin    Subjective: Pt c/o fatigue   Objective: Vitals:   05/08/21 0814 05/08/21 1531 05/08/21 2055 05/09/21 0511  BP: (!) 157/81 138/88 (!) 151/80 (!) 142/67  Pulse: (!) 110 (!) 107 (!) 108 (!) 102  Resp: 18 18 20 20   Temp: 97.6 F (36.4 C) 97.6 F (36.4 C) 98.1 F (36.7 C) 97.6 F (36.4 C)  TempSrc: Oral Oral Oral Oral  SpO2: 95% 94% 97% 93%  Weight:      Height:        Intake/Output Summary (Last 24 hours) at 05/09/2021 0745 Last data filed at 05/09/2021 0525 Gross per 24 hour  Intake 700 ml  Output 500 ml  Net 200 ml   Filed Weights   05/06/21 1914 05/08/21 0400  Weight: 90.7 kg 89.5 kg    Examination:  General exam: Appears comfortable  Respiratory system: clear breath sounds b/l  Cardiovascular system: S1/S2+. No rubs or clicks  Gastrointestinal system: Abd is soft, NT, ND & hypoactive bowel sounds  Central nervous system: Alert and oriented. Moves all  extremities Psychiatry: judgement and insight appear normal. Flat mood and affect    Data Reviewed: I have personally reviewed following labs and imaging studies  CBC: Recent Labs  Lab 05/06/21 1932 05/08/21 0907 05/09/21 0504  WBC 7.1 7.0 7.3  HGB 12.1 12.4 12.0  HCT 37.2 37.5 36.8  MCV 95.9 94.9 93.9  PLT 159 149* 572*   Basic Metabolic Panel: Recent Labs  Lab 05/06/21 1932 05/08/21 0907 05/09/21 0504  NA 139 137 138  K 3.8 3.4* 3.4*  CL 109 102 103  CO2 25 26 28   GLUCOSE 128* 203* 129*  BUN 24* 19 20  CREATININE 0.88 1.03* 0.73  CALCIUM 8.9 8.9 9.0   GFR: Estimated Creatinine Clearance: 63.5 mL/min (by C-G formula based on SCr of 0.73 mg/dL). Liver Function Tests: No results for input(s): AST, ALT, ALKPHOS, BILITOT, PROT, ALBUMIN in the last 168 hours. No results for input(s): LIPASE, AMYLASE in the last 168 hours. No results for input(s): AMMONIA in the last 168 hours. Coagulation Profile: No results for input(s): INR, PROTIME in the last 168 hours. Cardiac Enzymes: No results for input(s): CKTOTAL, CKMB, CKMBINDEX, TROPONINI in the last 168 hours. BNP (last 3 results) No results for input(s): PROBNP in the last 8760 hours. HbA1C: No results for input(s): HGBA1C in the last 72 hours. CBG: Recent Labs  Lab 05/08/21 [phone]  05/08/21 1204 05/08/21 1642 05/08/21 2052 05/09/21 0624  GLUCAP 147* 120* 142* 156* 144*   Lipid Profile: No results for input(s): CHOL, HDL, LDLCALC, TRIG, CHOLHDL, LDLDIRECT in the last 72 hours. Thyroid Function Tests: No results for input(s): TSH, T4TOTAL, FREET4, T3FREE, THYROIDAB in the last 72 hours. Anemia Panel: No results for input(s): VITAMINB12, FOLATE, FERRITIN, TIBC, IRON, RETICCTPCT in the last 72 hours. Sepsis Labs: No results for input(s): PROCALCITON, LATICACIDVEN in the last 168 hours.  Recent Results (from the past 240 hour(s))  Urine Culture     Status: Abnormal (Preliminary result)   Collection Time:  05/06/21  7:32 PM   Specimen: Urine, Random  Result Value Ref Range Status   Specimen Description   Final    URINE, RANDOM Performed at [facility], [address] [place], [place] [address]    Special Requests   Final    NONE Performed at [facility], [address], [place], [place] [address]    Culture (A)  Final    >=100,000 COLONIES/mL PROTEUS SPECIES SUSCEPTIBILITIES TO FOLLOW Performed at [facility], [place] [address], [place], [place] [address]    Report Status PENDING  Incomplete  Resp Panel by RT-PCR (Flu A&B, Covid) Nasopharyngeal Swab     Status: None   Collection Time: 05/07/21  5:48 AM   Specimen: Nasopharyngeal Swab; Nasopharyngeal(NP) swabs in vial transport medium  Result Value Ref Range Status   SARS Coronavirus 2 by RT PCR NEGATIVE NEGATIVE Final    Comment: (NOTE) SARS-CoV-2 target nucleic acids are NOT DETECTED.  The SARS-CoV-2 RNA is generally detectable in upper respiratory specimens during the acute phase of infection. The lowest concentration of SARS-CoV-2 viral copies this assay can detect is 138 copies/mL. A negative result does not preclude SARS-Cov-2 infection and should not be used as the sole basis for treatment or other patient management decisions. A negative result may occur with  improper specimen collection/handling, submission of specimen other than nasopharyngeal swab, presence of viral mutation(s) within the areas targeted by this assay, and inadequate number of viral copies(<138 copies/mL). A negative result must be combined with clinical observations, patient history, and epidemiological information. The expected result is Negative.  Fact Sheet for Patients:  EntrepreneurPulse.com.au  Fact Sheet for Healthcare Providers:  IncredibleEmployment.be  This test is no t yet approved or cleared by the [place] FDA and  has been authorized for detection and/or diagnosis of  SARS-CoV-2 by FDA under an Emergency Use Authorization (EUA). This EUA will remain  in effect (meaning this test can be used) for the duration of the COVID-19 declaration under Section 564(b)(1) of the Act, 21 U.S.C.section 360bbb-3(b)(1), unless the authorization is terminated  or revoked sooner.       Influenza A by PCR NEGATIVE NEGATIVE Final   Influenza B by PCR NEGATIVE NEGATIVE Final    Comment: (NOTE) The Xpert Xpress SARS-CoV-2/FLU/RSV plus assay is intended as an aid in the diagnosis of influenza from Nasopharyngeal swab specimens and should not be used as a sole basis for treatment. Nasal washings and aspirates are unacceptable for Xpert Xpress SARS-CoV-2/FLU/RSV testing.  Fact Sheet for Patients: EntrepreneurPulse.com.au  Fact Sheet for Healthcare Providers: IncredibleEmployment.be  This test is not yet approved or cleared by the [place] FDA and has been authorized for detection and/or diagnosis of SARS-CoV-2 by FDA under an Emergency Use Authorization (EUA). This EUA will remain in effect (meaning this test can be used) for the duration of the COVID-19 declaration  under Section 564(b)(1) of the Act, 21 U.S.C. section 360bbb-3(b)(1), unless the authorization is terminated or revoked.  Performed at [facility], [address] [place], [place] [address]          Radiology Studies: No results found.      Scheduled Meds:  amitriptyline  50 mg Oral QHS   atorvastatin  10 mg Oral QPM   enoxaparin (LOVENOX) injection  40 mg Subcutaneous Q24H   gabapentin  600 mg Oral TID   irbesartan  75 mg Oral Daily   And   hydrochlorothiazide  12.5 mg Oral Daily   Continuous Infusions:  cefTRIAXone (ROCEPHIN)  IV       LOS: 2 days    Time spent: 30 mins     Wyvonnia Dusky, MD [facility] Pager [phone]  If 7PM-7AM, please contact night-coverage 05/09/2021, 7:45 AM

## 2021-05-10 ENCOUNTER — Inpatient Hospital Stay: Payer: PPO

## 2021-05-10 DIAGNOSIS — R296 Repeated falls: Secondary | ICD-10-CM | POA: Diagnosis not present

## 2021-05-10 DIAGNOSIS — E119 Type 2 diabetes mellitus without complications: Secondary | ICD-10-CM | POA: Diagnosis not present

## 2021-05-10 DIAGNOSIS — N390 Urinary tract infection, site not specified: Secondary | ICD-10-CM | POA: Diagnosis not present

## 2021-05-10 LAB — BASIC METABOLIC PANEL
Anion gap: 8 (ref 5–15)
BUN: 21 mg/dL (ref 8–23)
CO2: 29 mmol/L (ref 22–32)
Calcium: 9 mg/dL (ref 8.9–10.3)
Chloride: 99 mmol/L (ref 98–111)
Creatinine, Ser: 0.82 mg/dL (ref 0.44–1.00)
GFR, Estimated: >60 mL/min (ref >60)
Glucose, Bld: 154 mg/dL — ABNORMAL HIGH (ref 70–99)
Potassium: 3.9 mmol/L (ref 3.5–5.1)
Sodium: 136 mmol/L (ref 135–145)

## 2021-05-10 LAB — CBC
HCT: 41 % (ref 36.0–46.0)
Hemoglobin: 13.4 g/dL (ref 12.0–15.0)
MCH: 30.8 pg (ref 26.0–34.0)
MCHC: 32.7 g/dL (ref 30.0–36.0)
MCV: 94.3 fL (ref 80.0–100.0)
Platelets: 152 10*3/uL (ref 150–400)
RBC: 4.35 MIL/uL (ref 3.87–5.11)
RDW: 13.8 % (ref 11.5–15.5)
WBC: 9 10*3/uL (ref 4.0–10.5)
nRBC: 0 % (ref 0.0–0.2)

## 2021-05-10 LAB — GLUCOSE, CAPILLARY
Glucose-Capillary: 141 mg/dL — ABNORMAL HIGH (ref 70–99)
Glucose-Capillary: 195 mg/dL — ABNORMAL HIGH (ref 70–99)
Glucose-Capillary: 155 mg/dL — ABNORMAL HIGH (ref 70–99)

## 2021-05-10 MED ORDER — VANCOMYCIN HCL 1500 MG/300ML IV SOLN: 1500.0000 mg | INTRAVENOUS | Status: DC

## 2021-05-10 MED ORDER — METOPROLOL TARTRATE 5 MG/5ML IV SOLN: 5.0000 mg | Freq: Three times a day (TID) | INTRAVENOUS | Status: DC | PRN

## 2021-05-10 MED ORDER — VANCOMYCIN HCL 2000 MG/400ML IV SOLN
2000.0000 mg | Freq: Once | INTRAVENOUS | Status: AC
  Administered 2021-05-10: 23:00:00 2000 mg via INTRAVENOUS
  Filled 2021-05-10: qty 400

## 2021-05-10 NOTE — Plan of Care (Signed)

## 2021-05-10 NOTE — Plan of Care (Signed)
  Problem: Clinical Measurements: Goal: Will remain free from infection Outcome: Not Progressing   Problem: Activity: Goal: Risk for activity intolerance will decrease Outcome: Not Progressing   Problem: Nutrition: Goal: Adequate nutrition will be maintained Outcome: Not Progressing   Problem: Elimination: Goal: Will not experience complications related to bowel motility Outcome: Not Progressing

## 2021-05-10 NOTE — Progress Notes (Signed)
PROGRESS NOTE    Danielle Nixon  TDD:[id number] DOB: 03-Mar-1936 DOA: 05/07/2021 PCP: Baxter Hire, MD    Assessment & Plan:   Principal Problem:   UTI (urinary tract infection) Active Problems:   DM (diabetes mellitus) ([place])   Hypertension   Frequent falls   UTI: urine cx is growing proteus penneri. Continue on IV rocpehin     Frequent falls: likely mechanical in etiology. PT/OT recs SNF. Pt and pt's son are agreeable to SNF   DM2: well controlled, HbA1c 6.4. Diet controlled    HTN: continue on home dose of irbesartan & HCTZ   Thrombocytopenia:  resolved   Hypokalemia: WNL today   DVT prophylaxis: lovenox  Code Status: full  Family Communication: called pt's son, Iona Beard, and answered his questions  Disposition Plan: likely d/c to SNF   Level of care: Med-Surg  Status is: Inpatient  Remains inpatient appropriate because: medically stable for d/c but waiting on SNF placement      Consultants:    Procedures:   Antimicrobials: rocephin    Subjective: Pt c/o feeling sleepy   Objective: Vitals:   05/09/21 2027 05/09/21 2114 05/10/21 0413 05/10/21 0730  BP: 138/83 122/74 (!) 141/86 126/75  Pulse: (!) 121 (!) 109 (!) 110 (!) 113  Resp: 18 18 18 18   Temp: 98.9 F (37.2 C) 98.8 F (37.1 C) 98.3 F (36.8 C) (!) 97.5 F (36.4 C)  TempSrc:  Oral Oral Oral  SpO2: 93% 90% 92% 95%  Weight:      Height:        Intake/Output Summary (Last 24 hours) at 05/10/2021 0806 Last data filed at 05/10/2021 0400 Gross per 24 hour  Intake 738.58 ml  Output 900 ml  Net -161.42 ml   Filed Weights   05/06/21 1914 05/08/21 0400  Weight: 90.7 kg 89.5 kg    Examination:  General exam: Appears comfortable but sleepy  Respiratory system: clear breath sounds b/l. No wheezes or rhonchi  Cardiovascular system: S1 & S2+. No clicks or gallops  Gastrointestinal system: Abd is soft, NT, ND & normal bowel sounds  Central nervous system: alert and oriented. Moves all  extremities  Psychiatry: judgement and insight appears normal. Flat mood and affect     Data Reviewed: I have personally reviewed following labs and imaging studies  CBC: Recent Labs  Lab 05/06/21 1932 05/08/21 0907 05/09/21 0504 05/10/21 0454  WBC 7.1 7.0 7.3 9.0  HGB 12.1 12.4 12.0 13.4  HCT 37.2 37.5 36.8 41.0  MCV 95.9 94.9 93.9 94.3  PLT 159 149* 135* 623   Basic Metabolic Panel: Recent Labs  Lab 05/06/21 1932 05/08/21 0907 05/09/21 0504 05/10/21 0454  NA 139 137 138 136  K 3.8 3.4* 3.4* 3.9  CL 109 102 103 99  CO2 25 26 28 29   GLUCOSE 128* 203* 129* 154*  BUN 24* 19 20 21   CREATININE 0.88 1.03* 0.73 0.82  CALCIUM 8.9 8.9 9.0 9.0   GFR: Estimated Creatinine Clearance: 62 mL/min (by C-G formula based on SCr of 0.82 mg/dL). Liver Function Tests: No results for input(s): AST, ALT, ALKPHOS, BILITOT, PROT, ALBUMIN in the last 168 hours. No results for input(s): LIPASE, AMYLASE in the last 168 hours. No results for input(s): AMMONIA in the last 168 hours. Coagulation Profile: No results for input(s): INR, PROTIME in the last 168 hours. Cardiac Enzymes: No results for input(s): CKTOTAL, CKMB, CKMBINDEX, TROPONINI in the last 168 hours. BNP (last 3 results) No results for input(s): PROBNP  in the last 8760 hours. HbA1C: No results for input(s): HGBA1C in the last 72 hours. CBG: Recent Labs  Lab 05/08/21 2052 05/09/21 0624 05/09/21 1109 05/09/21 1629 05/10/21 0723  GLUCAP 156* 144* 179* 192* 141*   Lipid Profile: No results for input(s): CHOL, HDL, LDLCALC, TRIG, CHOLHDL, LDLDIRECT in the last 72 hours. Thyroid Function Tests: No results for input(s): TSH, T4TOTAL, FREET4, T3FREE, THYROIDAB in the last 72 hours. Anemia Panel: No results for input(s): VITAMINB12, FOLATE, FERRITIN, TIBC, IRON, RETICCTPCT in the last 72 hours. Sepsis Labs: No results for input(s): PROCALCITON, LATICACIDVEN in the last 168 hours.  Recent Results (from the past 240 hour(s))   Urine Culture     Status: Abnormal   Collection Time: 05/06/21  7:32 PM   Specimen: Urine, Random  Result Value Ref Range Status   Specimen Description   Final    URINE, RANDOM Performed at [facility], [address] [place], [place] [address]    Special Requests   Final    NONE Performed at [facility], [place], [place] [address]    Culture >=100,000 COLONIES/mL PROTEUS PENNERI (A)  Final   Report Status 05/09/2021 FINAL  Final   Organism ID, Bacteria PROTEUS PENNERI (A)  Final      Susceptibility   Proteus penneri - MIC*    AMPICILLIN >=32 RESISTANT Resistant     CEFAZOLIN >=64 RESISTANT Resistant     CEFEPIME 0.25 SENSITIVE Sensitive     CEFTRIAXONE <=0.25 SENSITIVE Sensitive     CIPROFLOXACIN <=0.25 SENSITIVE Sensitive     GENTAMICIN <=1 SENSITIVE Sensitive     IMIPENEM 2 SENSITIVE Sensitive     NITROFURANTOIN 128 RESISTANT Resistant     TRIMETH/SULFA <=20 SENSITIVE Sensitive     AMPICILLIN/SULBACTAM 8 SENSITIVE Sensitive     PIP/TAZO <=4 SENSITIVE Sensitive     * >=100,000 COLONIES/mL PROTEUS PENNERI  Resp Panel by RT-PCR (Flu A&B, Covid) Nasopharyngeal Swab     Status: None   Collection Time: 05/07/21  5:48 AM   Specimen: Nasopharyngeal Swab; Nasopharyngeal(NP) swabs in vial transport medium  Result Value Ref Range Status   SARS Coronavirus 2 by RT PCR NEGATIVE NEGATIVE Final    Comment: (NOTE) SARS-CoV-2 target nucleic acids are NOT DETECTED.  The SARS-CoV-2 RNA is generally detectable in upper respiratory specimens during the acute phase of infection. The lowest concentration of SARS-CoV-2 viral copies this assay can detect is 138 copies/mL. A negative result does not preclude SARS-Cov-2 infection and should not be used as the sole basis for treatment or other patient management decisions. A negative result may occur with  improper specimen collection/handling, submission of specimen other than nasopharyngeal swab, presence  of viral mutation(s) within the areas targeted by this assay, and inadequate number of viral copies(<138 copies/mL). A negative result must be combined with clinical observations, patient history, and epidemiological information. The expected result is Negative.  Fact Sheet for Patients:  EntrepreneurPulse.com.au  Fact Sheet for Healthcare Providers:  IncredibleEmployment.be  This test is no t yet approved or cleared by the [place] FDA and  has been authorized for detection and/or diagnosis of SARS-CoV-2 by FDA under an Emergency Use Authorization (EUA). This EUA will remain  in effect (meaning this test can be used) for the duration of the COVID-19 declaration under Section 564(b)(1) of the Act, 21 U.S.C.section 360bbb-3(b)(1), unless the authorization is terminated  or revoked sooner.       Influenza A by PCR NEGATIVE NEGATIVE Final  Influenza B by PCR NEGATIVE NEGATIVE Final    Comment: (NOTE) The Xpert Xpress SARS-CoV-2/FLU/RSV plus assay is intended as an aid in the diagnosis of influenza from Nasopharyngeal swab specimens and should not be used as a sole basis for treatment. Nasal washings and aspirates are unacceptable for Xpert Xpress SARS-CoV-2/FLU/RSV testing.  Fact Sheet for Patients: EntrepreneurPulse.com.au  Fact Sheet for Healthcare Providers: IncredibleEmployment.be  This test is not yet approved or cleared by the [place] FDA and has been authorized for detection and/or diagnosis of SARS-CoV-2 by FDA under an Emergency Use Authorization (EUA). This EUA will remain in effect (meaning this test can be used) for the duration of the COVID-19 declaration under Section 564(b)(1) of the Act, 21 U.S.C. section 360bbb-3(b)(1), unless the authorization is terminated or revoked.  Performed at [facility], [address], [place], [place] [address]          Radiology  Studies: No results found.      Scheduled Meds:  amitriptyline  50 mg Oral QHS   atorvastatin  10 mg Oral QPM   docusate sodium  200 mg Oral BID   enoxaparin (LOVENOX) injection  40 mg Subcutaneous Q24H   gabapentin  600 mg Oral TID   irbesartan  75 mg Oral Daily   And   hydrochlorothiazide  12.5 mg Oral Daily   polyethylene glycol  17 g Oral Daily   Continuous Infusions:  cefTRIAXone (ROCEPHIN)  IV 1 g (05/09/21 1031)     LOS: 3 days    Time spent: 25 mins     Wyvonnia Dusky, MD [facility] Pager [phone]  If 7PM-7AM, please contact night-coverage 05/10/2021, 8:06 AM

## 2021-05-10 NOTE — Progress Notes (Signed)
Pharmacy Antibiotic Note  Danielle Nixon is a 85 y.o. female 85 y.o. female with PMH significant for breast cancer s/p lumpectomy and radiation, HTN, DM who presented to the ED for evaluation following multiple falls in the last couple of days.  Pharmacy has been consulted for vancomycin dosing for cellulitis for 7 days. Pt currently on ceftriaxone for Proteus penneri growing in urine cultures.   Plan: Pt scheduled to received 2 g IV vancomycin x 1 loading dose. Will continue with 1500 mg IV q24h Est AUC: 470.3 Used: Scr 0.82, IBW, Vd 0.72 Obtain vanc level around 4th or 5th dose if continued Monitor renal function and adjust dose as clinically indicated  Height: 5\' 9"  (175.3 cm) Weight: 89.5 kg (197 lb 5 oz) IBW/kg (Calculated) : 66.2  Temp (24hrs), Avg:98.9 F (37.2 C), Min:97.5 F (36.4 C), Max:100.6 F (38.1 C)  Recent Labs  Lab 05/06/21 1932 05/08/21 0907 05/09/21 0504 05/10/21 0454  WBC 7.1 7.0 7.3 9.0  CREATININE 0.88 1.03* 0.73 0.82    Estimated Creatinine Clearance: 62 mL/min (by C-G formula based on SCr of 0.82 mg/dL).    Allergies  Allergen Reactions   Ciprofloxacin Nausea And Vomiting    Rash also   Doxycycline Rash   Nitrofurantoin Monohyd Macro Rash   Penicillins Rash    Has taken recently without problems (in 2016) Did it involve swelling of the face/tongue/throat, SOB, or low BP? No Did it involve sudden or severe rash/hives, skin peeling, or any reaction on the inside of your mouth or nose? Unknown Did you need to seek medical attention at a hospital or doctor's office? Yes When did it last happen?      2016 If all above answers are "NO", may proceed with cephalosporin use.  Other reaction(s): Unknown Has taken recently without problems Has taken recently without problems (in 2016) Did it involve swelling of the face/tongue/throat, SOB, or low BP? No Did it involve sudden or severe rash/hives, skin peeling, or any reaction on the inside of your  mouth or nose? Unknown Did you need to seek medical attention at a hospital or doctor's office? Yes When did it last happen?      2016 If all above answers are "NO", may proceed with cephalosporin use.   Pseudoephedrine Hcl Other (See Comments)    Insomnia    Sulfa Antibiotics Rash    Makes patient extremely hyper   Terazosin Other (See Comments)    unknown    Antimicrobials this admission: 11/24 ceftriaxone >>  11/27 vancomycin >>    Microbiology results: 11/23 UCx: >= 100,000 colonies/ml Proteus Penneri   Thank you for allowing pharmacy to be a part of this patient's care.  Danielle Nixon Danielle Nixon 05/10/2021 10:03 PM

## 2021-05-10 NOTE — Progress Notes (Signed)
Physical Therapy Treatment Patient Details Name: Danielle Nixon MRN: [id number] DOB: Dec 19, 1935 Today's Date: 05/10/2021   History of Present Illness Danielle Nixon is a 85 y.o. female with history of breast cancer status postlumpectomy and radiation, hypertension, diabetes who presents to the emergency department with her son after she has had several falls over the past few days.    PT Comments    Patient received in bed, agrees to PT session. She requires increased time and effort with all bed mobility. Ultimately needed max assist to get seated on edge of bed. Once set up in sitting she leans to her right on bed for support. Unable to maintain unsupported sitting. Returned patient to bed with min/mod assist. Required +2 to scoot up in bed. Performed LE strengthening exercises with assist. Painful with movement in B LEs. (Multiple bruises and areas of swelling). Patient will continue to benefit from skilled PT while here to improve strength and functional independence. She will need rehab upon discharge.     Recommendations for follow up therapy are one component of a multi-disciplinary discharge planning process, led by the attending physician.  Recommendations may be updated based on patient status, additional functional criteria and insurance authorization.  Follow Up Recommendations  Skilled nursing-short term rehab (<3 hours/day)     Assistance Recommended at Discharge Intermittent Supervision/Assistance  Equipment Recommendations  Other (comment);Rolling walker (2 wheels) (TBD)    Recommendations for Other Services       Precautions / Restrictions Precautions Precautions: Fall Restrictions Weight Bearing Restrictions: No     Mobility  Bed Mobility Overal bed mobility: Needs Assistance Bed Mobility: Supine to Sit;Sit to Supine     Supine to sit: Max assist;HOB elevated Sit to supine: Min assist;HOB elevated   General bed mobility comments: Patient requires  increased time and effort as well as max assist to get seated on edge of bed. Once set up in sitting patient is unable to maintain sitting upright. Leaning to her right due to global weakness.    Transfers                   General transfer comment: not attempted, patient unable to sit up    Ambulation/Gait               General Gait Details: unable   Stairs             Wheelchair Mobility    Modified Rankin (Stroke Patients Only)       Balance Overall balance assessment: Needs assistance;History of Falls Sitting-balance support: Feet supported Sitting balance-Leahy Scale: Zero Sitting balance - Comments: patient unable to sit at edge of bed once positioned there. Falling over to her right side, leaning on bed. Postural control: Right lateral lean     Standing balance comment: unable                            Cognition Arousal/Alertness: Awake/alert Behavior During Therapy: WFL for tasks assessed/performed Overall Cognitive Status: Within Functional Limits for tasks assessed                                          Exercises Other Exercises Other Exercises: B LE strengthening exercises with assist. AP, heel slides, hip abd/add x 10 reps each    General Comments  Pertinent Vitals/Pain Pain Assessment: Faces Faces Pain Scale: Hurts even more Pain Location: B LEs Pain Descriptors / Indicators: Discomfort;Grimacing;Guarding Pain Intervention(s): Limited activity within patient's tolerance;Monitored during session;Repositioned    Home Living Family/patient expects to be discharged to:: Private residence Living Arrangements: Alone                      Prior Function            PT Goals (current goals can now be found in the care plan section) Acute Rehab PT Goals Patient Stated Goal: to get better PT Goal Formulation: With patient Time For Goal Achievement: 05/22/21 Potential to Achieve  Goals: Fair Progress towards PT goals: Not progressing toward goals - comment (patient weaker than prior sessions)    Frequency    Min 2X/week      PT Plan Current plan remains appropriate    Co-evaluation              AM-PAC PT "6 Clicks" Mobility   Outcome Measure  Help needed turning from your back to your side while in a flat bed without using bedrails?: A Lot Help needed moving from lying on your back to sitting on the side of a flat bed without using bedrails?: A Lot Help needed moving to and from a bed to a chair (including a wheelchair)?: Total Help needed standing up from a chair using your arms (e.g., wheelchair or bedside chair)?: Total Help needed to walk in hospital room?: Total Help needed climbing 3-5 steps with a railing? : Total 6 Click Score: 8    End of Session   Activity Tolerance: Patient limited by fatigue;Patient limited by pain Patient left: in bed;with call bell/phone within reach;with bed alarm set Nurse Communication: Mobility status PT Visit Diagnosis: Other abnormalities of gait and mobility (R26.89);Muscle weakness (generalized) (M62.81);Pain Pain - Right/Left:  (B) Pain - part of body: Leg     Time: 2703-5009 PT Time Calculation (min) (ACUTE ONLY): 31 min  Charges:  $Therapeutic Exercise: 8-22 mins $Therapeutic Activity: 8-22 mins                     [employer], PT, G[employer] 05/10/21,12:04 PM

## 2021-05-11 ENCOUNTER — Inpatient Hospital Stay: Payer: PPO

## 2021-05-11 DIAGNOSIS — E1169 Type 2 diabetes mellitus with other specified complication: Secondary | ICD-10-CM | POA: Diagnosis not present

## 2021-05-11 DIAGNOSIS — N390 Urinary tract infection, site not specified: Secondary | ICD-10-CM | POA: Diagnosis not present

## 2021-05-11 DIAGNOSIS — R296 Repeated falls: Secondary | ICD-10-CM | POA: Diagnosis not present

## 2021-05-11 LAB — GLUCOSE, CAPILLARY
Glucose-Capillary: 157 mg/dL — ABNORMAL HIGH (ref 70–99)
Glucose-Capillary: 142 mg/dL — ABNORMAL HIGH (ref 70–99)
Glucose-Capillary: 217 mg/dL — ABNORMAL HIGH (ref 70–99)
Glucose-Capillary: 165 mg/dL — ABNORMAL HIGH (ref 70–99)

## 2021-05-11 LAB — CBC
HCT: 37.8 % (ref 36.0–46.0)
Hemoglobin: 12.2 g/dL (ref 12.0–15.0)
MCH: 31.6 pg (ref 26.0–34.0)
MCHC: 32.3 g/dL (ref 30.0–36.0)
MCV: 97.9 fL (ref 80.0–100.0)
Platelets: 166 10*3/uL (ref 150–400)
RBC: 3.86 MIL/uL — ABNORMAL LOW (ref 3.87–5.11)
RDW: 14 % (ref 11.5–15.5)
WBC: 9.5 10*3/uL (ref 4.0–10.5)
nRBC: 0 % (ref 0.0–0.2)

## 2021-05-11 LAB — BASIC METABOLIC PANEL
Anion gap: 10 (ref 5–15)
BUN: 31 mg/dL — ABNORMAL HIGH (ref 8–23)
CO2: 25 mmol/L (ref 22–32)
Calcium: 9 mg/dL (ref 8.9–10.3)
Chloride: 100 mmol/L (ref 98–111)
Creatinine, Ser: 1.06 mg/dL — ABNORMAL HIGH (ref 0.44–1.00)
GFR, Estimated: 52 mL/min — ABNORMAL LOW (ref >60)
Glucose, Bld: 140 mg/dL — ABNORMAL HIGH (ref 70–99)
Potassium: 4.3 mmol/L (ref 3.5–5.1)
Sodium: 135 mmol/L (ref 135–145)

## 2021-05-11 LAB — PROCALCITONIN: Procalcitonin: 0.2 ng/mL

## 2021-05-11 MED ORDER — VANCOMYCIN HCL 1250 MG/250ML IV SOLN
1250.0000 mg | INTRAVENOUS | Status: DC
  Administered 2021-05-12: 1250 mg via INTRAVENOUS
  Filled 2021-05-11: qty 250

## 2021-05-11 NOTE — Progress Notes (Signed)
PROGRESS NOTE    Danielle Nixon  JJH:[id number] DOB: 08-Jul-1935 DOA: 05/07/2021 PCP: Baxter Hire, MD    Assessment & Plan:   Principal Problem:   UTI (urinary tract infection) Active Problems:   DM (diabetes mellitus) ([place])   Hypertension   Frequent falls   UTI: urine cx is growing proteus penneri. Continue on IV rocpehin. Spiking low grade fevers. Blood cxs ordered. Procal  0.20   Frequent falls: likely mechanical in etiology. PT/OT recs SNF. Pt and pt's son are agreeable to SNF   DM2: HbA1c 6.4, well controlled. Diet controlled    HTN: continue on home dose of HCTZ & irbesartan   Thrombocytopenia: resolved   Hypokalemia: within normal limits   DVT prophylaxis: lovenox  Code Status: full  Family Communication:  Disposition Plan: likely d/c to SNF   Level of care: Med-Surg  Status is: Inpatient  Remains inpatient appropriate because: spiking fevers so pt will need to be afebrile for at least 24 hours prior to d/c   Consultants:    Procedures:   Antimicrobials: rocephin    Subjective: Pt c/o malaise    Objective: Vitals:   05/10/21 1655 05/10/21 1928 05/10/21 2349 05/11/21 0408  BP: 118/72 114/64 111/65 121/76  Pulse: (!) 120 99 (!) 119 (!) 113  Resp: 20 20 18 18   Temp: 99.6 F (37.6 C) 99 F (37.2 C) 98.9 F (37.2 C) 99 F (37.2 C)  TempSrc: Oral Oral Oral Oral  SpO2: 92% 94% 95% 95%  Weight:      Height:        Intake/Output Summary (Last 24 hours) at 05/11/2021 0747 Last data filed at 05/11/2021 0414 Gross per 24 hour  Intake 520 ml  Output 650 ml  Net -130 ml   Filed Weights   05/06/21 1914 05/08/21 0400  Weight: 90.7 kg 89.5 kg    Examination:  General exam: Appears uncomfortable  Respiratory system: clear breath sounds b/l  Cardiovascular system: S1/S2+. No rubs or clicks  Gastrointestinal system: Abd is soft, NT, ND & hypoactive bowel sounds  Central nervous system: Alert and oriented. Moves all extremities    Psychiatry: judgement and insight appears normal. Flat mood and and affect     Data Reviewed: I have personally reviewed following labs and imaging studies  CBC: Recent Labs  Lab 05/06/21 1932 05/08/21 0907 05/09/21 0504 05/10/21 0454 05/11/21 0602  WBC 7.1 7.0 7.3 9.0 9.5  HGB 12.1 12.4 12.0 13.4 12.2  HCT 37.2 37.5 36.8 41.0 37.8  MCV 95.9 94.9 93.9 94.3 97.9  PLT 159 149* 135* 152 818   Basic Metabolic Panel: Recent Labs  Lab 05/06/21 1932 05/08/21 0907 05/09/21 0504 05/10/21 0454  NA 139 137 138 136  K 3.8 3.4* 3.4* 3.9  CL 109 102 103 99  CO2 25 26 28 29   GLUCOSE 128* 203* 129* 154*  BUN 24* 19 20 21   CREATININE 0.88 1.03* 0.73 0.82  CALCIUM 8.9 8.9 9.0 9.0   GFR: Estimated Creatinine Clearance: 62 mL/min (by C-G formula based on SCr of 0.82 mg/dL). Liver Function Tests: No results for input(s): AST, ALT, ALKPHOS, BILITOT, PROT, ALBUMIN in the last 168 hours. No results for input(s): LIPASE, AMYLASE in the last 168 hours. No results for input(s): AMMONIA in the last 168 hours. Coagulation Profile: No results for input(s): INR, PROTIME in the last 168 hours. Cardiac Enzymes: No results for input(s): CKTOTAL, CKMB, CKMBINDEX, TROPONINI in the last 168 hours. BNP (last 3 results) No results  for input(s): PROBNP in the last 8760 hours. HbA1C: No results for input(s): HGBA1C in the last 72 hours. CBG: Recent Labs  Lab 05/09/21 1629 05/10/21 0723 05/10/21 1056 05/10/21 1654 05/11/21 0613  GLUCAP 192* 141* 195* 155* 157*   Lipid Profile: No results for input(s): CHOL, HDL, LDLCALC, TRIG, CHOLHDL, LDLDIRECT in the last 72 hours. Thyroid Function Tests: No results for input(s): TSH, T4TOTAL, FREET4, T3FREE, THYROIDAB in the last 72 hours. Anemia Panel: No results for input(s): VITAMINB12, FOLATE, FERRITIN, TIBC, IRON, RETICCTPCT in the last 72 hours. Sepsis Labs: No results for input(s): PROCALCITON, LATICACIDVEN in the last 168 hours.  Recent  Results (from the past 240 hour(s))  Urine Culture     Status: Abnormal   Collection Time: 05/06/21  7:32 PM   Specimen: Urine, Random  Result Value Ref Range Status   Specimen Description   Final    URINE, RANDOM Performed at [facility], [address] [place], [place] [address]    Special Requests   Final    NONE Performed at [facility], [place], [place] [address]    Culture >=100,000 COLONIES/mL PROTEUS PENNERI (A)  Final   Report Status 05/09/2021 FINAL  Final   Organism ID, Bacteria PROTEUS PENNERI (A)  Final      Susceptibility   Proteus penneri - MIC*    AMPICILLIN >=32 RESISTANT Resistant     CEFAZOLIN >=64 RESISTANT Resistant     CEFEPIME 0.25 SENSITIVE Sensitive     CEFTRIAXONE <=0.25 SENSITIVE Sensitive     CIPROFLOXACIN <=0.25 SENSITIVE Sensitive     GENTAMICIN <=1 SENSITIVE Sensitive     IMIPENEM 2 SENSITIVE Sensitive     NITROFURANTOIN 128 RESISTANT Resistant     TRIMETH/SULFA <=20 SENSITIVE Sensitive     AMPICILLIN/SULBACTAM 8 SENSITIVE Sensitive     PIP/TAZO <=4 SENSITIVE Sensitive     * >=100,000 COLONIES/mL PROTEUS PENNERI  Resp Panel by RT-PCR (Flu A&B, Covid) Nasopharyngeal Swab     Status: None   Collection Time: 05/07/21  5:48 AM   Specimen: Nasopharyngeal Swab; Nasopharyngeal(NP) swabs in vial transport medium  Result Value Ref Range Status   SARS Coronavirus 2 by RT PCR NEGATIVE NEGATIVE Final    Comment: (NOTE) SARS-CoV-2 target nucleic acids are NOT DETECTED.  The SARS-CoV-2 RNA is generally detectable in upper respiratory specimens during the acute phase of infection. The lowest concentration of SARS-CoV-2 viral copies this assay can detect is 138 copies/mL. A negative result does not preclude SARS-Cov-2 infection and should not be used as the sole basis for treatment or other patient management decisions. A negative result may occur with  improper specimen collection/handling, submission of specimen  other than nasopharyngeal swab, presence of viral mutation(s) within the areas targeted by this assay, and inadequate number of viral copies(<138 copies/mL). A negative result must be combined with clinical observations, patient history, and epidemiological information. The expected result is Negative.  Fact Sheet for Patients:  EntrepreneurPulse.com.au  Fact Sheet for Healthcare Providers:  IncredibleEmployment.be  This test is no t yet approved or cleared by the [place] FDA and  has been authorized for detection and/or diagnosis of SARS-CoV-2 by FDA under an Emergency Use Authorization (EUA). This EUA will remain  in effect (meaning this test can be used) for the duration of the COVID-19 declaration under Section 564(b)(1) of the Act, 21 U.S.C.section 360bbb-3(b)(1), unless the authorization is terminated  or revoked sooner.       Influenza A by PCR  NEGATIVE NEGATIVE Final   Influenza B by PCR NEGATIVE NEGATIVE Final    Comment: (NOTE) The Xpert Xpress SARS-CoV-2/FLU/RSV plus assay is intended as an aid in the diagnosis of influenza from Nasopharyngeal swab specimens and should not be used as a sole basis for treatment. Nasal washings and aspirates are unacceptable for Xpert Xpress SARS-CoV-2/FLU/RSV testing.  Fact Sheet for Patients: EntrepreneurPulse.com.au  Fact Sheet for Healthcare Providers: IncredibleEmployment.be  This test is not yet approved or cleared by the [place] FDA and has been authorized for detection and/or diagnosis of SARS-CoV-2 by FDA under an Emergency Use Authorization (EUA). This EUA will remain in effect (meaning this test can be used) for the duration of the COVID-19 declaration under Section 564(b)(1) of the Act, 21 U.S.C. section 360bbb-3(b)(1), unless the authorization is terminated or revoked.  Performed at [facility], [address],  [place], [place] [address]          Radiology Studies: [place] LT LOWER EXTREM LTD SOFT TISSUE NON VASCULAR  Result Date: 05/10/2021 CLINICAL DATA:  Left lower extremity not/swelling. EXAM: ULTRASOUND LEFT LOWER EXTREMITY LIMITED TECHNIQUE: Ultrasound examination of the lower extremity soft tissues was performed in the area of clinical concern. COMPARISON:  None. FINDINGS: There is a complex collection lateral proximal leg, difficult to a accurately measure due to its size, measuring at least 7 x 2 x 7 cm. Color Doppler blood flow is seen along some septa within the collection and along the collections periphery. IMPRESSION: 1. Complex collection, at least 7 cm in size, corresponds to the palpable not/swelling the proximal lateral left leg. This has the appearance a hematoma. Abscess should be considered if there are consistent clinical findings. Electronically Signed   By: Lajean Manes M.D.   On: 05/10/2021 20:41        Scheduled Meds:  amitriptyline  50 mg Oral QHS   atorvastatin  10 mg Oral QPM   docusate sodium  200 mg Oral BID   enoxaparin (LOVENOX) injection  40 mg Subcutaneous Q24H   gabapentin  600 mg Oral TID   irbesartan  75 mg Oral Daily   And   hydrochlorothiazide  12.5 mg Oral Daily   polyethylene glycol  17 g Oral Daily   Continuous Infusions:  cefTRIAXone (ROCEPHIN)  IV Stopped (05/10/21 0843)   vancomycin       LOS: 4 days    Time spent: 30 mins     Wyvonnia Dusky, MD [facility] Pager [phone]  If 7PM-7AM, please contact night-coverage 05/11/2021, 7:47 AM

## 2021-05-11 NOTE — Plan of Care (Signed)
  Problem: Clinical Measurements: Goal: Will remain free from infection Outcome: Not Progressing   Problem: Safety: Goal: Ability to remain free from injury will improve Outcome: Not Progressing   Problem: Skin Integrity: Goal: Risk for impaired skin integrity will decrease Outcome: Not Progressing

## 2021-05-11 NOTE — TOC Initial Note (Signed)
Transition of Care [facility]) - Initial/Assessment Note    Patient Details  Name: Danielle Nixon MRN: [id number] Date of Birth: [birth year]  Transition of Care [facility]) CM/SW Contact:    Beverly Sessions, RN Phone Number: 05/11/2021, 2:07 PM  Clinical Narrative:                 Patient admitted from home with UTI Patient lives at home alone PCP [facility]  Son lives locally and delivers 2 meals a day  Notified by Magda Paganini with APS that they have and open APS case  PT recommending SNF. Patient in agreement for bed search Existing PASRR Fl2 sent for signature Bed search initiated  VM left for son requests return call to update   Expected Discharge Plan: Skilled Nursing Facility Barriers to Discharge: Continued Medical Work up   Patient Goals and CMS Choice        Expected Discharge Plan and Services Expected Discharge Plan: [place] Acute Care Choice: [place] arrangements for the past 2 months: Single Family Home                                      Prior Living Arrangements/Services Living arrangements for the past 2 months: Single Family Home Lives with:: Self Patient language and need for interpreter reviewed:: Yes Do you feel safe going back to the place where you live?: Yes      Need for Family Participation in Patient Care: Yes (Comment) Care giver support system in place?: Yes (comment)   Criminal Activity/Legal Involvement Pertinent to Current Situation/Hospitalization: No - Comment as needed  Activities of Daily Living Home Assistive Devices/Equipment: Cane (specify quad or straight) ADL Screening (condition at time of admission) Patient's cognitive ability adequate to safely complete daily activities?: Yes Is the patient deaf or have difficulty hearing?: No Does the patient have difficulty seeing, even when wearing glasses/contacts?: No Does the patient have difficulty concentrating,  remembering, or making decisions?: No Patient able to express need for assistance with ADLs?: Yes Does the patient have difficulty dressing or bathing?: Yes Independently performs ADLs?: Yes (appropriate for developmental age) Does the patient have difficulty walking or climbing stairs?: Yes Weakness of Legs: Both Weakness of Arms/Hands: Both  Permission Sought/Granted                  Emotional Assessment       Orientation: : Oriented to Self, Oriented to Place, Oriented to  Time, Oriented to Situation Alcohol / Substance Use: Not Applicable Psych Involvement: No (comment)  Admission diagnosis:  UTI (urinary tract infection) [N39.0] Acute UTI [N39.0] Multiple contusions [T07.XXXA] Frequent falls [R29.6] Injury of head, initial encounter [S09.90XA] Patient Active Problem List   Diagnosis Date Noted   UTI (urinary tract infection) 05/07/2021   DM (diabetes mellitus) ([place])    Hypertension    Frequent falls    Carcinoma of overlapping sites of right breast in female, estrogen receptor positive ([place]) 10/06/2016   Urge incontinence 06/17/2015   Atrophic vaginitis 06/17/2015   H/O malignant neoplasm of breast 07/18/2013   PCP:  Baxter Hire, MD Pharmacy:   [id number] [place], [place] - [address] [place] [address] Phone: [phone] Fax: [phone]     Social Determinants of Health (SDOH) Interventions    Readmission Risk Interventions No flowsheet  data found.

## 2021-05-11 NOTE — Care Management Important Message (Signed)
Important Message  Patient Details  Name: Danielle Nixon MRN: [id number] Date of Birth: 12/08/1935   Medicare Important Message Given:  Yes     [name] 05/11/2021, 11:31 AM

## 2021-05-11 NOTE — Progress Notes (Signed)
Pharmacy Antibiotic Note  Danielle Nixon is a 85 y.o. female 85 y.o. female with PMH significant for breast cancer s/p lumpectomy and radiation, HTN, DM who presented to the ED for evaluation following multiple falls in the last couple of days.  Pharmacy has been consulted for vancomycin dosing for cellulitis for 7 days. Pt currently on ceftriaxone for Proteus penneri growing in urine cultures. Her renal function has been trending slightly lower  Plan: adjust vancomycin to 1250 mg IV every 24 hours Est AUC: 493.8 Used: SCr 1.06 mg/dL Ke: 0.039 h-1, T1/2 17.6 h Daily SCr while on IV vancomycin Obtain vanc level around 4th or 5th dose if continued Monitor renal function and adjust dose as clinically indicated  Height: 5\' 9"  (175.3 cm) Weight: 89.5 kg (197 lb 5 oz) IBW/kg (Calculated) : 66.2  Temp (24hrs), Avg:98.9 F (37.2 C), Min:97.6 F (36.4 C), Max:100.6 F (38.1 C)  Recent Labs  Lab 05/06/21 1932 05/08/21 0907 05/09/21 0504 05/10/21 0454 05/11/21 0602  WBC 7.1 7.0 7.3 9.0 9.5  CREATININE 0.88 1.03* 0.73 0.82 1.06*     Estimated Creatinine Clearance: 47.9 mL/min (A) (by C-G formula based on SCr of 1.06 mg/dL (H)).    Allergies  Allergen Reactions   Ciprofloxacin Nausea And Vomiting    Rash also   Doxycycline Rash   Nitrofurantoin Monohyd Macro Rash   Penicillins Rash    Has taken recently without problems (in 2016) Did it involve swelling of the face/tongue/throat, SOB, or low BP? No Did it involve sudden or severe rash/hives, skin peeling, or any reaction on the inside of your mouth or nose? Unknown Did you need to seek medical attention at a hospital or doctor's office? Yes When did it last happen?      2016 If all above answers are "NO", may proceed with cephalosporin use.  Other reaction(s): Unknown Has taken recently without problems Has taken recently without problems (in 2016) Did it involve swelling of the face/tongue/throat, SOB, or low BP? No Did it  involve sudden or severe rash/hives, skin peeling, or any reaction on the inside of your mouth or nose? Unknown Did you need to seek medical attention at a hospital or doctor's office? Yes When did it last happen?      2016 If all above answers are "NO", may proceed with cephalosporin use.   Pseudoephedrine Hcl Other (See Comments)    Insomnia    Sulfa Antibiotics Rash    Makes patient extremely hyper   Terazosin Other (See Comments)    unknown    Antimicrobials this admission: 11/24 ceftriaxone >>  11/27 vancomycin >>   Microbiology results: 11/28 BCx: pending 11/23 UCx: >= 100,000 colonies/ml Proteus Penneri  11/24 SARS CoV-2: negative 11/24 influenza A/B: negative  Thank you for allowing pharmacy to be a part of this patient's care.  Dallie Piles 05/11/2021 12:41 PM

## 2021-05-11 NOTE — NC FL2 (Signed)
[place] LEVEL OF CARE SCREENING TOOL     IDENTIFICATION  Patient Name: Danielle Nixon Birthdate: Jun 15, 1935 Sex: female Admission Date (Current Location): 05/07/2021  [facility] and [place] Number:  Engineering geologist and Address:         Provider Number: [id number]  Attending Physician Name and Address:  Wyvonnia Dusky, MD  Relative Name and Phone Number:       Current Level of Care: Hospital Recommended Level of Care: [place] Prior Approval Number:    Date Approved/Denied:   PASRR Number: [id number]  Discharge Plan: SNF    Current Diagnoses: Patient Active Problem List   Diagnosis Date Noted   UTI (urinary tract infection) 05/07/2021   DM (diabetes mellitus) ([place])    Hypertension    Frequent falls    Carcinoma of overlapping sites of right breast in female, estrogen receptor positive ([place]) 10/06/2016   Urge incontinence 06/17/2015   Atrophic vaginitis 06/17/2015   H/O malignant neoplasm of breast 07/18/2013    Orientation RESPIRATION BLADDER Height & Weight     Self, Time, Situation, Place  Normal Incontinent, External catheter Weight: 89.5 kg Height:  5\' 9"  (175.3 cm)  BEHAVIORAL SYMPTOMS/MOOD NEUROLOGICAL BOWEL NUTRITION STATUS      Continent Diet (Heart Healthy Carb modified)  AMBULATORY STATUS COMMUNICATION OF NEEDS Skin   Extensive Assist Verbally                         Personal Care Assistance Level of Assistance              Functional Limitations Info             SPECIAL CARE FACTORS FREQUENCY  PT (By licensed PT), OT (By licensed OT)                    Contractures Contractures Info: Not present    Additional Factors Info  Code Status, Allergies Code Status Info: Full Allergies Info: Ciprofloxacin, Doxycycline, Nitrofurantoin Monohyd Macro, Penicillins, Pseudoephedrine Hcl, Sulfa Antibiotics, Terazosin           Current Medications (05/11/2021):  This is the current  hospital active medication list Current Facility-Administered Medications  Medication Dose Route Frequency Provider Last Rate Last Admin   acetaminophen (TYLENOL) tablet 650 mg  650 mg Oral Q6H PRN Agbata, Tochukwu, MD   650 mg at 05/10/21 1620   amitriptyline (ELAVIL) tablet 50 mg  50 mg Oral QHS Agbata, Tochukwu, MD   50 mg at 05/10/21 2121   atorvastatin (LIPITOR) tablet 10 mg  10 mg Oral QPM Agbata, Tochukwu, MD   10 mg at 05/10/21 1804   cefTRIAXone (ROCEPHIN) 1 g in sodium chloride 0.9 % 100 mL IVPB  1 g Intravenous Q24H Wyvonnia Dusky, MD 200 mL/hr at 05/11/21 1050 1 g at 05/11/21 1050   docusate sodium (COLACE) capsule 200 mg  200 mg Oral BID Wyvonnia Dusky, MD   200 mg at 05/11/21 1044   enoxaparin (LOVENOX) injection 40 mg  40 mg Subcutaneous Q24H Agbata, Tochukwu, MD   40 mg at 05/10/21 2121   gabapentin (NEURONTIN) capsule 600 mg  600 mg Oral TID Agbata, Tochukwu, MD   600 mg at 05/11/21 1044   irbesartan (AVAPRO) tablet 75 mg  75 mg Oral Daily Lorna Dibble, RPH   75 mg at 05/11/21 1044   And   hydrochlorothiazide (HYDRODIURIL) tablet 12.5 mg  12.5 mg Oral Daily  Darrin Luis D, RPH   12.5 mg at 05/11/21 1044   metoprolol tartrate (LOPRESSOR) injection 5 mg  5 mg Intravenous Q8H PRN Wyvonnia Dusky, MD       ondansetron [facility]) tablet 4 mg  4 mg Oral Q6H PRN Agbata, Tochukwu, MD       Or   ondansetron (ZOFRAN) injection 4 mg  4 mg Intravenous Q6H PRN Agbata, Tochukwu, MD       polyethylene glycol (MIRALAX / GLYCOLAX) packet 17 g  17 g Oral Daily Wyvonnia Dusky, MD   17 g at 05/11/21 1044   vancomycin (VANCOREADY) IVPB 1250 mg/250 mL  1,250 mg Intravenous Q24H Dallie Piles, [facility]         Discharge Medications: Please see discharge summary for a list of discharge medications.  Relevant Imaging Results:  Relevant Lab Results:   Additional Information ss [id number]  Beverly Sessions, RN

## 2021-05-11 NOTE — Progress Notes (Signed)
PT Cancellation Note  Patient Details Name: Danielle Nixon MRN: [id number] DOB: 01-27-1936   Cancelled Treatment:    Reason Eval/Treat Not Completed: Fatigue/lethargy limiting ability to participate. Pt received supine in bed appearing lethargic. [place] out of position satting at 88%. [place] returned to nose via 2L/min with quick return to 94%. Attempts made today to transfer to recliner. MaxA provided at [facility] and attempts made at maxA to assist pt to sitting EoB but pt declining further mobility requesting to rest. Due to pt lethargy, pt returned to supine in bed and LE's assisted for comfort. Noted blood on sock at R toe. Sock doffed and appears fourth toe was recently bleeding at toe nail from scab. RN notified of SPO2 prior to PT intervention and toe. Clean socks donned. PT will re-attempt as medically appropriate.    Salem Caster. Fairly IV, PT, DPT Physical Therapist- [facility]  05/11/2021, 2:19 PM

## 2021-05-12 ENCOUNTER — Inpatient Hospital Stay: Payer: PPO

## 2021-05-12 DIAGNOSIS — N390 Urinary tract infection, site not specified: Secondary | ICD-10-CM | POA: Diagnosis not present

## 2021-05-12 DIAGNOSIS — R296 Repeated falls: Secondary | ICD-10-CM | POA: Diagnosis not present

## 2021-05-12 DIAGNOSIS — E119 Type 2 diabetes mellitus without complications: Secondary | ICD-10-CM | POA: Diagnosis not present

## 2021-05-12 LAB — CBC
HCT: 35.5 % — ABNORMAL LOW (ref 36.0–46.0)
Hemoglobin: 11.5 g/dL — ABNORMAL LOW (ref 12.0–15.0)
MCH: 31.8 pg (ref 26.0–34.0)
MCHC: 32.4 g/dL (ref 30.0–36.0)
MCV: 98.1 fL (ref 80.0–100.0)
Platelets: 176 10*3/uL (ref 150–400)
RBC: 3.62 MIL/uL — ABNORMAL LOW (ref 3.87–5.11)
RDW: 13.7 % (ref 11.5–15.5)
WBC: 9.2 10*3/uL (ref 4.0–10.5)
nRBC: 0 % (ref 0.0–0.2)

## 2021-05-12 LAB — BASIC METABOLIC PANEL
Anion gap: 9 (ref 5–15)
BUN: 46 mg/dL — ABNORMAL HIGH (ref 8–23)
CO2: 26 mmol/L (ref 22–32)
Calcium: 8.9 mg/dL (ref 8.9–10.3)
Chloride: 100 mmol/L (ref 98–111)
Creatinine, Ser: 1.84 mg/dL — ABNORMAL HIGH (ref 0.44–1.00)
GFR, Estimated: 27 mL/min — ABNORMAL LOW (ref >60)
Glucose, Bld: 150 mg/dL — ABNORMAL HIGH (ref 70–99)
Potassium: 4.5 mmol/L (ref 3.5–5.1)
Sodium: 135 mmol/L (ref 135–145)

## 2021-05-12 LAB — GLUCOSE, CAPILLARY
Glucose-Capillary: 124 mg/dL — ABNORMAL HIGH (ref 70–99)
Glucose-Capillary: 154 mg/dL — ABNORMAL HIGH (ref 70–99)
Glucose-Capillary: 131 mg/dL — ABNORMAL HIGH (ref 70–99)

## 2021-05-12 MED ORDER — SODIUM CHLORIDE 0.9 % IV SOLN: INTRAVENOUS | Status: DC

## 2021-05-12 MED ORDER — ENOXAPARIN SODIUM 30 MG/0.3ML IJ SOSY
30.0000 mg | PREFILLED_SYRINGE | INTRAMUSCULAR | Status: DC
  Administered 2021-05-12: 30 mg via SUBCUTANEOUS

## 2021-05-12 MED ORDER — VANCOMYCIN VARIABLE DOSE PER UNSTABLE RENAL FUNCTION (PHARMACIST DOSING): Status: DC

## 2021-05-12 NOTE — Plan of Care (Signed)

## 2021-05-12 NOTE — Progress Notes (Signed)
PROGRESS NOTE    HPI was taken from Dr. Francine Graven: Danielle Nixon is a 85 y.o. female with medical history significant for breast cancer status postlumpectomy and radiation, hypertension, diabetes mellitus who presents to the emergency room via private vehicle for evaluation following multiple falls in the last couple of days. Patient is unsteady on her feet and has a rolling walker and a cane to use with ambulation.  She is unable to tell me if she has had assist devices when she falls but thinks that she loses her balance every time this happens and that her legs are weak.  She denies feeling dizzy or lightheaded.   She has struck her head on 1 occasion but denies losing consciousness.  She has multiple bruises on her upper and lower extremities from the falls. Patient lives alone. She denies having any nausea, no vomiting, no chest pain, no shortness of breath, no headache, no fever, no chills, no changes in her bowel habits, no abdominal pain, no palpitations, no diaphoresis, no dizziness or lightheadedness. Labs show sodium 139, potassium 3.8, chloride 109, bicarb 25, glucose 128, BUN 24, creatinine 0.8, calcium 8.9, white count 7.1, hemoglobin 12.1, hematocrit 37.2, MCV 95.9, RDW 14.0, platelet count 159 Respiratory viral panel is negative Urine analysis shows pyuria Chest x-ray reviewed by me shows bilateral mid to lower lung field subpleural hazy and streaky densities.  Likely scarring or sequela of prior inflammatory/infectious process Right knee x-ray unremarkable.  No acute findings. X-ray of left tibia-fibula shows soft tissue swelling without acute osseous finding. Twelve-lead EKG reviewed by me shows sinus rhythm.     ED Course: Patient is an 85 year old female who presents to the ER for evaluation of frequent falls.  She has pyuria and will be admitted to the hospital for further evaluation.   Hospital course from Dr. Jimmye Norman 11/25-11/29/22: Pt was found to have UTI secondary  to proteus penneri. On IV rocephin but still spiking fevers of unknown etiology. Started on IV vanco overnight by Dr. Sidney Ace. If continues to spike fevers, consider ID consult. PT/OT recs SNF but pt is not medically stable yet.    Danielle Nixon  GYF:[id number] DOB: 12-22-1935 DOA: 05/07/2021 PCP: Baxter Hire, MD    Assessment & Plan:   Principal Problem:   UTI (urinary tract infection) Active Problems:   DM (diabetes mellitus) ([place])   Hypertension   Frequent falls   UTI: urine cx is growing proteus penneri. Continue on IV rocephin. IV vanco was added overnight by Dr. Sidney Ace. No cellulitis noted. Blood cxs pending. CXR shows no acute findings. Still spiking fevers. If pt continues to spike fevers, consider ID consult.   LLE swelling: has appearance of hematoma as per ultrasound. Pt recently had a fall prior to admission as per pt. Healing bruises noted on LLE as well. No cellulitis of either leg    Frequent falls: likely mechanical in etiology. PT/OT recs SNF. Pt and pt's son are agreeable to SNF   DM2: well controlled, HbA1c 6.4. Diet controlled    HTN: continue on home dose of irbesartan, HCTZ  Thrombocytopenia: resolved   Hypokalemia: WNL today   DVT prophylaxis: lovenox  Code Status: full  Family Communication: called pt's son, Danielle Nixon, but no answer so I left a message  Disposition Plan: likely d/c to SNF   Level of care: Med-Surg  Status is: Inpatient  Remains inpatient appropriate because: spiking fevers of unknown etiology. Blood cxs pending. Needs to be afebrile 24 hrs prior to  d/c   Consultants:    Procedures:   Antimicrobials: rocephin,vanco    Subjective: Pt c/o fatigue   Objective: Vitals:   05/11/21 1543 05/11/21 1800 05/12/21 0005 05/12/21 0425  BP: 135/72  (!) 102/55 (!) 87/56  Pulse: (!) 125  (!) 106 (!) 109  Resp: 19  16 16   Temp: 98.4 F (36.9 C) (!) 101 F (38.3 C) 97.6 F (36.4 C) 97.6 F (36.4 C)  TempSrc:    Oral  SpO2: 97%   96% 99%  Weight:      Height:        Intake/Output Summary (Last 24 hours) at 05/12/2021 0734 Last data filed at 05/11/2021 2015 Gross per 24 hour  Intake 720 ml  Output 0 ml  Net 720 ml   Filed Weights   05/06/21 1914 05/08/21 0400  Weight: 90.7 kg 89.5 kg    Examination:  General exam: Appears calm & comfortable  Respiratory system: clear breath sounds b/l  Cardiovascular system: S1/S2+. No rubs or clicks  Gastrointestinal system: Abd is soft, NT, ND & hypoactive bowel sounds  Central nervous system: Alert and oriented. Moves all extremities   Psychiatry: judgement and insight appears normal. Flat mood and and affect     Data Reviewed: I have personally reviewed following labs and imaging studies  CBC: Recent Labs  Lab 05/08/21 0907 05/09/21 0504 05/10/21 0454 05/11/21 0602 05/12/21 0529  WBC 7.0 7.3 9.0 9.5 9.2  HGB 12.4 12.0 13.4 12.2 11.5*  HCT 37.5 36.8 41.0 37.8 35.5*  MCV 94.9 93.9 94.3 97.9 98.1  PLT 149* 135* 152 166 408   Basic Metabolic Panel: Recent Labs  Lab 05/08/21 0907 05/09/21 0504 05/10/21 0454 05/11/21 0602 05/12/21 0529  NA 137 138 136 135 135  K 3.4* 3.4* 3.9 4.3 4.5  CL 102 103 99 100 100  CO2 26 28 29 25 26   GLUCOSE 203* 129* 154* 140* 150*  BUN 19 20 21  31* 46*  CREATININE 1.03* 0.73 0.82 1.06* 1.84*  CALCIUM 8.9 9.0 9.0 9.0 8.9   GFR: Estimated Creatinine Clearance: 27.6 mL/min (A) (by C-G formula based on SCr of 1.84 mg/dL (H)). Liver Function Tests: No results for input(s): AST, ALT, ALKPHOS, BILITOT, PROT, ALBUMIN in the last 168 hours. No results for input(s): LIPASE, AMYLASE in the last 168 hours. No results for input(s): AMMONIA in the last 168 hours. Coagulation Profile: No results for input(s): INR, PROTIME in the last 168 hours. Cardiac Enzymes: No results for input(s): CKTOTAL, CKMB, CKMBINDEX, TROPONINI in the last 168 hours. BNP (last 3 results) No results for input(s): PROBNP in the last 8760  hours. HbA1C: No results for input(s): HGBA1C in the last 72 hours. CBG: Recent Labs  Lab 05/10/21 1654 05/11/21 0613 05/11/21 0813 05/11/21 1121 05/11/21 1642  GLUCAP 155* 157* 142* 217* 165*   Lipid Profile: No results for input(s): CHOL, HDL, LDLCALC, TRIG, CHOLHDL, LDLDIRECT in the last 72 hours. Thyroid Function Tests: No results for input(s): TSH, T4TOTAL, FREET4, T3FREE, THYROIDAB in the last 72 hours. Anemia Panel: No results for input(s): VITAMINB12, FOLATE, FERRITIN, TIBC, IRON, RETICCTPCT in the last 72 hours. Sepsis Labs: Recent Labs  Lab 05/11/21 0806  PROCALCITON 0.20    Recent Results (from the past 240 hour(s))  Urine Culture     Status: Abnormal   Collection Time: 05/06/21  7:32 PM   Specimen: Urine, Random  Result Value Ref Range Status   Specimen Description   Final    URINE, RANDOM Performed  at [facility], [address] [place], [place] [address]    Special Requests   Final    NONE Performed at [facility], [place], [place] [address]    Culture >=100,000 COLONIES/mL PROTEUS PENNERI (A)  Final   Report Status 05/09/2021 FINAL  Final   Organism ID, Bacteria PROTEUS PENNERI (A)  Final      Susceptibility   Proteus penneri - MIC*    AMPICILLIN >=32 RESISTANT Resistant     CEFAZOLIN >=64 RESISTANT Resistant     CEFEPIME 0.25 SENSITIVE Sensitive     CEFTRIAXONE <=0.25 SENSITIVE Sensitive     CIPROFLOXACIN <=0.25 SENSITIVE Sensitive     GENTAMICIN <=1 SENSITIVE Sensitive     IMIPENEM 2 SENSITIVE Sensitive     NITROFURANTOIN 128 RESISTANT Resistant     TRIMETH/SULFA <=20 SENSITIVE Sensitive     AMPICILLIN/SULBACTAM 8 SENSITIVE Sensitive     PIP/TAZO <=4 SENSITIVE Sensitive     * >=100,000 COLONIES/mL PROTEUS PENNERI  Resp Panel by RT-PCR (Flu A&B, Covid) Nasopharyngeal Swab     Status: None   Collection Time: 05/07/21  5:48 AM   Specimen: Nasopharyngeal Swab; Nasopharyngeal(NP) swabs in vial transport  medium  Result Value Ref Range Status   SARS Coronavirus 2 by RT PCR NEGATIVE NEGATIVE Final    Comment: (NOTE) SARS-CoV-2 target nucleic acids are NOT DETECTED.  The SARS-CoV-2 RNA is generally detectable in upper respiratory specimens during the acute phase of infection. The lowest concentration of SARS-CoV-2 viral copies this assay can detect is 138 copies/mL. A negative result does not preclude SARS-Cov-2 infection and should not be used as the sole basis for treatment or other patient management decisions. A negative result may occur with  improper specimen collection/handling, submission of specimen other than nasopharyngeal swab, presence of viral mutation(s) within the areas targeted by this assay, and inadequate number of viral copies(<138 copies/mL). A negative result must be combined with clinical observations, patient history, and epidemiological information. The expected result is Negative.  Fact Sheet for Patients:  EntrepreneurPulse.com.au  Fact Sheet for Healthcare Providers:  IncredibleEmployment.be  This test is no t yet approved or cleared by the [place] FDA and  has been authorized for detection and/or diagnosis of SARS-CoV-2 by FDA under an Emergency Use Authorization (EUA). This EUA will remain  in effect (meaning this test can be used) for the duration of the COVID-19 declaration under Section 564(b)(1) of the Act, 21 U.S.C.section 360bbb-3(b)(1), unless the authorization is terminated  or revoked sooner.       Influenza A by PCR NEGATIVE NEGATIVE Final   Influenza B by PCR NEGATIVE NEGATIVE Final    Comment: (NOTE) The Xpert Xpress SARS-CoV-2/FLU/RSV plus assay is intended as an aid in the diagnosis of influenza from Nasopharyngeal swab specimens and should not be used as a sole basis for treatment. Nasal washings and aspirates are unacceptable for Xpert Xpress SARS-CoV-2/FLU/RSV testing.  Fact Sheet for  Patients: EntrepreneurPulse.com.au  Fact Sheet for Healthcare Providers: IncredibleEmployment.be  This test is not yet approved or cleared by the [place] FDA and has been authorized for detection and/or diagnosis of SARS-CoV-2 by FDA under an Emergency Use Authorization (EUA). This EUA will remain in effect (meaning this test can be used) for the duration of the COVID-19 declaration under Section 564(b)(1) of the Act, 21 U.S.C. section 360bbb-3(b)(1), unless the authorization is terminated or revoked.  Performed at [facility], [address] [place],  [address]  Radiology Studies: CT HEAD WO CONTRAST (5MM)  Result Date: 05/11/2021 CLINICAL DATA:  Multiple falls.  History of breast cancer. EXAM: CT HEAD WITHOUT CONTRAST TECHNIQUE: Contiguous axial images were obtained from the base of the skull through the vertex without intravenous contrast. COMPARISON:  05/03/2021 FINDINGS: Brain: No acute intracranial hemorrhage. No focal mass lesion. No CT evidence of acute infarction. No midline shift or mass effect. No hydrocephalus. Basilar cisterns are patent. There are periventricular and subcortical white matter hypodensities. Generalized cortical atrophy. Vascular: No hyperdense vessel or unexpected calcification. Skull: Normal. Negative for fracture or focal lesion. Sinuses/Orbits: Complete opacification of the LEFT maxillary sinus again noted. Orbits normal Other: None. IMPRESSION: 1. No acute intracranial findings. 2. No change short in interval. Electronically Signed   By: Suzy Bouchard M.D.   On: 05/11/2021 19:27   [place] LT LOWER EXTREM LTD SOFT TISSUE NON VASCULAR  Result Date: 05/10/2021 CLINICAL DATA:  Left lower extremity not/swelling. EXAM: ULTRASOUND LEFT LOWER EXTREMITY LIMITED TECHNIQUE: Ultrasound examination of the lower extremity soft tissues was performed in the area of clinical concern. COMPARISON:  None.  FINDINGS: There is a complex collection lateral proximal leg, difficult to a accurately measure due to its size, measuring at least 7 x 2 x 7 cm. Color Doppler blood flow is seen along some septa within the collection and along the collections periphery. IMPRESSION: 1. Complex collection, at least 7 cm in size, corresponds to the palpable not/swelling the proximal lateral left leg. This has the appearance a hematoma. Abscess should be considered if there are consistent clinical findings. Electronically Signed   By: Lajean Manes M.D.   On: 05/10/2021 20:41        Scheduled Meds:  amitriptyline  50 mg Oral QHS   atorvastatin  10 mg Oral QPM   docusate sodium  200 mg Oral BID   enoxaparin (LOVENOX) injection  30 mg Subcutaneous Q24H   gabapentin  600 mg Oral TID   irbesartan  75 mg Oral Daily   And   hydrochlorothiazide  12.5 mg Oral Daily   polyethylene glycol  17 g Oral Daily   Continuous Infusions:  sodium chloride     cefTRIAXone (ROCEPHIN)  IV 1 g (05/11/21 1050)   vancomycin 1,250 mg (05/12/21 0009)     LOS: 5 days    Time spent: 33 mins     Wyvonnia Dusky, MD [facility] Pager [phone]  If 7PM-7AM, please contact night-coverage 05/12/2021, 7:34 AM

## 2021-05-12 NOTE — Progress Notes (Signed)
Pharmacy Antibiotic Note  Danielle Nixon is a 85 y.o. female 85 y.o. female with PMH significant for breast cancer s/p lumpectomy and radiation, HTN, DM who presented to the ED for evaluation following multiple falls in the last couple of days.  Pharmacy has been consulted for vancomycin dosing for cellulitis for 7 days. Pt currently on ceftriaxone for Proteus penneri growing in urine cultures. Her SCr is elevated well above yesterday's level. Her last dose was 1250 mg 05/12/21 0009   Plan: hold this evening's vancomycin dose vancomycin random level in am Daily SCr while on IV vancomycin Monitor renal function and adjust dose as clinically indicated  Height: 5\' 9"  (175.3 cm) Weight: 89.5 kg (197 lb 5 oz) IBW/kg (Calculated) : 66.2  Temp (24hrs), Avg:98.4 F (36.9 C), Min:97.6 F (36.4 C), Max:101 F (38.3 C)  Recent Labs  Lab 05/08/21 0907 05/09/21 0504 05/10/21 0454 05/11/21 0602 05/12/21 0529  WBC 7.0 7.3 9.0 9.5 9.2  CREATININE 1.03* 0.73 0.82 1.06* 1.84*     Estimated Creatinine Clearance: 27.6 mL/min (A) (by C-G formula based on SCr of 1.84 mg/dL (H)).    Allergies  Allergen Reactions   Ciprofloxacin Nausea And Vomiting    Rash also   Doxycycline Rash   Nitrofurantoin Monohyd Macro Rash   Penicillins Rash    Has taken recently without problems (in 2016) Did it involve swelling of the face/tongue/throat, SOB, or low BP? No Did it involve sudden or severe rash/hives, skin peeling, or any reaction on the inside of your mouth or nose? Unknown Did you need to seek medical attention at a hospital or doctor's office? Yes When did it last happen?      2016 If all above answers are "NO", may proceed with cephalosporin use.  Other reaction(s): Unknown Has taken recently without problems Has taken recently without problems (in 2016) Did it involve swelling of the face/tongue/throat, SOB, or low BP? No Did it involve sudden or severe rash/hives, skin peeling, or any  reaction on the inside of your mouth or nose? Unknown Did you need to seek medical attention at a hospital or doctor's office? Yes When did it last happen?      2016 If all above answers are "NO", may proceed with cephalosporin use.   Pseudoephedrine Hcl Other (See Comments)    Insomnia    Sulfa Antibiotics Rash    Makes patient extremely hyper   Terazosin Other (See Comments)    unknown    Antimicrobials this admission: 11/24 ceftriaxone >>  11/27 vancomycin >>   Microbiology results: 11/28 BCx: NG x 1 day 11/23 UCx: >= 100,000 colonies/ml Proteus Penneri  11/24 SARS CoV-2: negative 11/24 influenza A/B: negative  Thank you for allowing pharmacy to be a part of this patient's care.  Dallie Piles 05/12/2021 6:56 AM

## 2021-05-12 NOTE — Progress Notes (Signed)
Provider Dr. Adline Peals notified of patients vitals, no new orders, felt that BP drop possible due to pt sleeping. Will continue to monitor pt.

## 2021-05-12 NOTE — Progress Notes (Signed)
Cross Coverage Provider Note - No charge  Received message from nursing staff that patient has a yellow MEWs due to HR 109 and BP of 87/56 (MAP of 66).   Remainder of vitals: T 97.6, RR 16, spO2 of 99% on 2 LNC  This provider asked nursing regarding patient's mentation.   Nursing responds that patient is sleeping and is disoriented when they wake her for questions.   Recommendations: Please allow patient to sleep peacefully. A minor drop in blood pressure during sleep is normal and disorientation after being aroused from slumber is common, especially for elderly patient population.   Dr. Tobie Poet [facility]

## 2021-05-12 NOTE — Progress Notes (Signed)
Physical Therapy Treatment Patient Details Name: Danielle Nixon MRN: [id number] DOB: 1935-09-04 Today's Date: 05/12/2021   History of Present Illness Danielle Nixon is a 85 y.o. female with history of breast cancer status postlumpectomy and radiation, hypertension, diabetes who presents to the emergency department with her son after she has had several falls over the past few days.    PT Comments    Patient needs encouragement to participate with PT efforts. She complains about pain in bilateral hands, lower back, and right knee with knee flexion. Patient agreeable initially to attempt sitting up on edge of bed. Patient able to activate movement of BLE towards edge of bed but then declined further movement due to right knee pain with flexion. ROM/therapeutic exercises  provided to bilateral LE. Patient declined further mobility efforts, even with rolling in bed. Recommend to continue PT to maximize independence and facilitate return to prior level function. SNF recommended at discharge.     Recommendations for follow up therapy are one component of a multi-disciplinary discharge planning process, led by the attending physician.  Recommendations may be updated based on patient status, additional functional criteria and insurance authorization.  Follow Up Recommendations  Skilled nursing-short term rehab (<3 hours/day)     Assistance Recommended at Discharge Frequent or constant Supervision/Assistance  Equipment Recommendations   (to be determined at next level of care)    Recommendations for Other Services       Precautions / Restrictions Precautions Precautions: Fall Restrictions Weight Bearing Restrictions: No     Mobility  Bed Mobility Overal bed mobility: Needs Assistance             General bed mobility comments: attempted supine to sitting, however patient refused after any movement of RLE due to knee pain with flexion. ROM provided to right RLE and offered again  to attempt to mobilize. patient refused. encouraged movement in bed, however patient also decined this due to low back pain    Transfers                        Ambulation/Gait                   Stairs             Wheelchair Mobility    Modified Rankin (Stroke Patients Only)       Balance                                            Cognition Arousal/Alertness: Awake/alert Behavior During Therapy: WFL for tasks assessed/performed Overall Cognitive Status: No family/caregiver present to determine baseline cognitive functioning                                 General Comments: patient able to follow single step commands with increased time. needs encouragement to participate with PT        Exercises General Exercises - Lower Extremity Heel Slides: AAROM;Strengthening;Left;10 reps;Supine (patient refused right knee flexion attempts due to pain) Hip ABduction/ADduction: AAROM;Strengthening;Both;10 reps;Supine Straight Leg Raises: AAROM;Strengthening;Both;10 reps;Supine Other Exercises Other Exercises: verbal cues for exercise technique    General Comments        Pertinent Vitals/Pain Pain Assessment: 0-10 Faces Pain Scale: Hurts little more Pain Location: bilateral hands, low back, right knee  Pain Descriptors / Indicators: Discomfort Pain Intervention(s): Repositioned    Home Living                          Prior Function            PT Goals (current goals can now be found in the care plan section) Acute Rehab PT Goals Patient Stated Goal: to get better PT Goal Formulation: With patient Time For Goal Achievement: 05/22/21 Potential to Achieve Goals: Fair Progress towards PT goals: Not progressing toward goals - comment    Frequency    Min 2X/week      PT Plan Current plan remains appropriate    Co-evaluation              AM-PAC PT "6 Clicks" Mobility   Outcome Measure   Help needed turning from your back to your side while in a flat bed without using bedrails?: A Lot Help needed moving from lying on your back to sitting on the side of a flat bed without using bedrails?: A Lot Help needed moving to and from a bed to a chair (including a wheelchair)?: Total Help needed standing up from a chair using your arms (e.g., wheelchair or bedside chair)?: Total Help needed to walk in hospital room?: Total Help needed climbing 3-5 steps with a railing? : Total 6 Click Score: 8    End of Session   Activity Tolerance: Patient limited by pain;Patient limited by fatigue Patient left: in bed;with call bell/phone within reach;with bed alarm set Nurse Communication:  (alerte nurse that IV was alarming) PT Visit Diagnosis: Other abnormalities of gait and mobility (R26.89);Muscle weakness (generalized) (M62.81);Pain Pain - Right/Left: Right Pain - part of body: Knee (lower back, bilateral hands)     Time: 0347-4259 PT Time Calculation (min) (ACUTE ONLY): 11 min  Charges:  $Therapeutic Exercise: 8-22 mins                     Minna Merritts, PT, MPT    Percell Locus 05/12/2021, 4:18 PM

## 2021-05-12 NOTE — Progress Notes (Signed)
PHARMACIST - PHYSICIAN COMMUNICATION  CONCERNING:  Enoxaparin (Lovenox) for DVT Prophylaxis    RECOMMENDATION: Patient was prescribed enoxaprin 40mg  q24 hours for VTE prophylaxis.   Filed Weights   05/06/21 1914 05/08/21 0400  Weight: 90.7 kg (200 lb) 89.5 kg (197 lb 5 oz)    Body mass index is 29.14 kg/m.  Estimated Creatinine Clearance: 27.6 mL/min (A) (by C-G formula based on SCr of 1.84 mg/dL (H)).   Based on [place]   Patient is candidate for enoxaparin 30mg  every 24 hours based on CrCl <14ml/min   DESCRIPTION: Pharmacy has adjusted enoxaparin dose per [facility] policy.  Patient is now receiving enoxaparin 30 mg every 24 hours    Dallie Piles, PharmD Clinical Pharmacist  05/12/2021 6:59 AM

## 2021-05-13 DIAGNOSIS — N3000 Acute cystitis without hematuria: Secondary | ICD-10-CM | POA: Diagnosis not present

## 2021-05-13 LAB — CBC
HCT: 35.7 % — ABNORMAL LOW (ref 36.0–46.0)
Hemoglobin: 11.6 g/dL — ABNORMAL LOW (ref 12.0–15.0)
MCH: 31.6 pg (ref 26.0–34.0)
MCHC: 32.5 g/dL (ref 30.0–36.0)
MCV: 97.3 fL (ref 80.0–100.0)
Platelets: 194 10*3/uL (ref 150–400)
RBC: 3.67 MIL/uL — ABNORMAL LOW (ref 3.87–5.11)
RDW: 13.7 % (ref 11.5–15.5)
WBC: 7.7 10*3/uL (ref 4.0–10.5)
nRBC: 0 % (ref 0.0–0.2)

## 2021-05-13 LAB — GLUCOSE, CAPILLARY
Glucose-Capillary: 127 mg/dL — ABNORMAL HIGH (ref 70–99)
Glucose-Capillary: 174 mg/dL — ABNORMAL HIGH (ref 70–99)
Glucose-Capillary: 116 mg/dL — ABNORMAL HIGH (ref 70–99)
Glucose-Capillary: 158 mg/dL — ABNORMAL HIGH (ref 70–99)

## 2021-05-13 LAB — BASIC METABOLIC PANEL
Anion gap: 5 (ref 5–15)
BUN: 41 mg/dL — ABNORMAL HIGH (ref 8–23)
CO2: 26 mmol/L (ref 22–32)
Calcium: 9 mg/dL (ref 8.9–10.3)
Chloride: 104 mmol/L (ref 98–111)
Creatinine, Ser: 0.92 mg/dL (ref 0.44–1.00)
GFR, Estimated: >60 mL/min (ref >60)
Glucose, Bld: 139 mg/dL — ABNORMAL HIGH (ref 70–99)
Potassium: 4.6 mmol/L (ref 3.5–5.1)
Sodium: 135 mmol/L (ref 135–145)

## 2021-05-13 LAB — VANCOMYCIN, RANDOM: Vancomycin Rm: 13

## 2021-05-13 MED ORDER — CARVEDILOL 6.25 MG PO TABS
3.1250 mg | ORAL_TABLET | Freq: Two times a day (BID) | ORAL | Status: DC
  Administered 2021-05-13 – 2021-05-18 (×10): 3.125 mg via ORAL
  Filled 2021-05-13 (×10): qty 1

## 2021-05-13 MED ORDER — ORAL CARE MOUTH RINSE
15.0000 mL | Freq: Two times a day (BID) | OROMUCOSAL | Status: DC
  Administered 2021-05-13 – 2021-05-19 (×7): 15 mL via OROMUCOSAL

## 2021-05-13 MED ORDER — ENOXAPARIN SODIUM 40 MG/0.4ML IJ SOSY
40.0000 mg | PREFILLED_SYRINGE | INTRAMUSCULAR | Status: DC
  Administered 2021-05-13 – 2021-05-18 (×5): 40 mg via SUBCUTANEOUS
  Filled 2021-05-13 (×6): qty 0.4

## 2021-05-13 MED ORDER — SENNOSIDES-DOCUSATE SODIUM 8.6-50 MG PO TABS
1.0000 | ORAL_TABLET | Freq: Every day | ORAL | Status: DC
  Administered 2021-05-13 – 2021-05-18 (×5): 1 via ORAL
  Filled 2021-05-13 (×6): qty 1

## 2021-05-13 NOTE — TOC Progression Note (Signed)
Transition of Care [facility]) - Progression Note    Patient Details  Name: Danielle Nixon MRN: [id number] Date of Birth: 16-Nov-1935  [facility]) CM/SW Contact  Beverly Sessions, RN Phone Number: 05/13/2021, 10:10 AM  Clinical Narrative:    VM left for son to present bed offers   Expected Discharge Plan: [place] Barriers to Discharge: Continued Medical Work up  Expected Discharge Plan and Services Expected Discharge Plan: [place] Choice: [place] arrangements for the past 2 months: Single Family Home                                       Social Determinants of Health (SDOH) Interventions    Readmission Risk Interventions No flowsheet data found.

## 2021-05-13 NOTE — TOC Progression Note (Signed)
Transition of Care [facility]) - Progression Note    Patient Details  Name: SAVANHA ISLAND MRN: [id number] Date of Birth: [birth year]  Transition of Care [facility]) CM/SW Contact  Beverly Sessions, RN Phone Number: 05/13/2021, 1:35 PM  Clinical Narrative:     Bed offers presented to patient and son WOM accepted. Debbie at [facility] notified and accepted in [place] left for health team to start insurance auth  Expected Discharge Plan: [place] Barriers to Discharge: Continued Medical Work up  Expected Discharge Plan and Services Expected Discharge Plan: [place] Choice: [place] arrangements for the past 2 months: Single Family Home                                       Social Determinants of Health (SDOH) Interventions    Readmission Risk Interventions No flowsheet data found.

## 2021-05-13 NOTE — Progress Notes (Signed)
PROGRESS NOTE  Danielle Nixon  DOB: 02/25/1936  PCP: Baxter Hire, MD FGH:[id number]  DOA: 05/07/2021  LOS: 6 days  Hospital Day: 7  Chief Complaint  Patient presents with   Fall    Brief narrative: Danielle Nixon is a 85 y.o. female with PMH significant for DM2, HTN, breast cancer status postlumpectomy and radiation. Patient was brought to the ED from home on 11/24 for evaluation following multiple falls in the last couple of days.  Patient lives alone, uses a rolling walker and a cane for ambulation.  She was having progressive loss of balance and multiple falls leading to bruises in her arms and legs.  Urinalysis showed pyuria Skeletal survey did not show any evidence of fracture. Patient was admitted to hospital service IV antibiotics started for UTI Urine culture grew Proteus penneri. See below for details  Subjective: Patient was seen and examined this morning.  Pleasant elderly Caucasian female.  Propped up in bed.  On low-flow oxygen.  Not in distress.  Assessment/Plan: Proteus penneri UTI -Completed a week course of IV Rocephin.  During the course of antibiotics, patient had breakthrough fevers and hence IV vancomycin was added.  No other source of infection identified.  I stopped IV vancomycin this morning.  Continue to monitor.  If fever recurs, will consider ID consultation and further work-up.     LLE swelling -has appearance of hematoma as per ultrasound.  This is likely because of fall.    Frequent falls  -Due to impaired mobility.  PT eval obtained.  SNF placement recommended.    DM2 -Diet controlled, HbA1c 6.4.   Sinus tachycardia Essential hypertension -Heart rate consistently between 100 and 110 bpm.  Regular rhythm. Losartan HCTZ on hold.  Start on Coreg 3.125 mg twice daily which could help both blood pressure and heart rate.   Thrombocytopenia -resolved  Constipation -Reports no bowel movement last several days.  Senokot scheduled.   MiraLAX as needed   Mobility: Encourage ambulation  Living condition: was living at home. Goals of care:   Code Status: Full Code  Nutritional status: Body mass index is 29.14 kg/m.     Diet:  Diet Order             Diet heart healthy/carb modified Room service appropriate? Yes; Fluid consistency: Thin  Diet effective now                  DVT prophylaxis:  enoxaparin (LOVENOX) injection 40 mg Start: 05/13/21 2200   Antimicrobials: None currently Fluid: NS@100  mill per hour Consultants: None Family Communication: None at bedside  Status is: Inpatient  Remains inpatient appropriate because: Continue to monitor off antibiotics.  Pending SNF  Dispo: The patient is from: Home              Anticipated d/c is to: SNF              Patient currently is not medically stable to d/c.   Difficult to place patient No     Infusions:   sodium chloride 100 mL/hr at 05/13/21 0920    Scheduled Meds:  amitriptyline  50 mg Oral QHS   atorvastatin  10 mg Oral QPM   carvedilol  3.125 mg Oral BID WC   enoxaparin (LOVENOX) injection  40 mg Subcutaneous Q24H   polyethylene glycol  17 g Oral Daily   senna-docusate  1 tablet Oral QHS    PRN meds: acetaminophen, ondansetron **OR** ondansetron (ZOFRAN) IV  Antimicrobials: Anti-infectives (From admission, onward)    Start     Dose/Rate Route Frequency Ordered Stop   05/12/21 1504  vancomycin variable dose per unstable renal function (pharmacist dosing)  Status:  Discontinued         Does not apply See admin instructions 05/12/21 1505 05/13/21 0836   05/11/21 2300  vancomycin (VANCOREADY) IVPB 1500 mg/300 mL  Status:  Discontinued        1,500 mg 150 mL/hr over 120 Minutes Intravenous Every 24 hours 05/10/21 2209 05/11/21 1246   05/11/21 2300  vancomycin (VANCOREADY) IVPB 1250 mg/250 mL  Status:  Discontinued        1,250 mg 166.7 mL/hr over 90 Minutes Intravenous Every 24 hours 05/11/21 1246 05/12/21 1505   05/10/21 2300   vancomycin (VANCOREADY) IVPB 2000 mg/400 mL        2,000 mg 200 mL/hr over 120 Minutes Intravenous  Once 05/10/21 2200 05/11/21 0217   05/09/21 1000  cefTRIAXone (ROCEPHIN) 1 g in sodium chloride 0.9 % 100 mL IVPB        1 g 200 mL/hr over 30 Minutes Intravenous Every 24 hours 05/08/21 1351 05/13/21 0945   05/08/21 0600  cefTRIAXone (ROCEPHIN) 1 g in sodium chloride 0.9 % 100 mL IVPB        1 g 200 mL/hr over 30 Minutes Intravenous  Once 05/07/21 1001 05/08/21 1012   05/07/21 0515  cefTRIAXone (ROCEPHIN) 1 g in sodium chloride 0.9 % 100 mL IVPB        1 g 200 mL/hr over 30 Minutes Intravenous  Once 05/07/21 0506 05/07/21 0631       Objective: Vitals:   05/13/21 0828 05/13/21 1515  BP: 121/70 (!) 117/49  Pulse: (!) 107 (!) 106  Resp: 16 20  Temp: 97.8 F (36.6 C) 98.7 F (37.1 C)  SpO2: 97% 98%    Intake/Output Summary (Last 24 hours) at 05/13/2021 1556 Last data filed at 05/13/2021 1049 Gross per 24 hour  Intake 0 ml  Output 900 ml  Net -900 ml   Filed Weights   05/06/21 1914 05/08/21 0400  Weight: 90.7 kg 89.5 kg   Weight change:  Body mass index is 29.14 kg/m.   Physical Exam: General exam: Pleasant elderly Caucasian female.  Propped up in bed.  Not in distress Skin: No rashes, lesions or ulcers. HEENT: Atraumatic, normocephalic, no obvious bleeding Lungs: Clear to auscultate bilaterally CVS: Mild tachycardia, regular rhythm, no murmur GI/Abd soft, nontender, nondistended, bowel sound present CNS: Alert, awake, oriented x3 Psychiatry: Mood appropriate Extremities: No pedal edema, no calf tenderness  Data Review: I have personally reviewed the laboratory data and studies available.  F/u labs ordered Unresulted Labs (From admission, onward)     Start     Ordered   05/14/21 0500  Creatinine, serum  (enoxaparin (LOVENOX)    CrCl >/= 30 ml/min)  Weekly,   STAT     Comments: while on enoxaparin therapy    05/07/21 1000   05/14/21 0500  CBC with  Differential/Platelet  Daily,   R     Question:  Specimen collection method  Answer:  Lab=Lab collect   05/13/21 1556   05/14/21 9702  Basic metabolic panel  Daily,   R     Question:  Specimen collection method  Answer:  Lab=Lab collect   05/13/21 1556            Signed, Terrilee Croak, MD [facility] 05/13/2021

## 2021-05-13 NOTE — TOC Progression Note (Signed)
Transition of Care [facility]) - Progression Note    Patient Details  Name: Danielle Nixon MRN: [id number] Date of Birth: 1936/01/16  [facility]) CM/SW Contact  Beverly Sessions, RN Phone Number: 05/13/2021, 2:10 PM  Clinical Narrative:     Received return call from [facility] she is to start [facility] today  Expected Discharge Plan: Skilled Nursing Facility Barriers to Discharge: Continued Medical Work up  Expected Discharge Plan and Services Expected Discharge Plan: [place] Choice: [place] arrangements for the past 2 months: Single Family Home                                       Social Determinants of Health (SDOH) Interventions    Readmission Risk Interventions No flowsheet data found.

## 2021-05-13 NOTE — Progress Notes (Signed)
PHARMACIST - PHYSICIAN COMMUNICATION  CONCERNING:  Enoxaparin (Lovenox) for DVT Prophylaxis    RECOMMENDATION: Patient was prescribed enoxaparin 30mg  q24 hours for VTE prophylaxis.   Filed Weights   05/06/21 1914 05/08/21 0400  Weight: 90.7 kg (200 lb) 89.5 kg (197 lb 5 oz)    Body mass index is 29.14 kg/m.  Estimated Creatinine Clearance: 55.2 mL/min (by C-G formula based on SCr of 0.92 mg/dL).   Patient is candidate for enoxaparin 40mg  every 24 hours based on CrCl >8ml/min and Weight >45kg  DESCRIPTION: Pharmacy has adjusted enoxaparin dose per [facility] policy.  Patient is now receiving enoxaparin 40 mg every 24 hours   Benita Gutter 05/13/2021 10:29 AM

## 2021-05-14 DIAGNOSIS — N3000 Acute cystitis without hematuria: Secondary | ICD-10-CM | POA: Diagnosis not present

## 2021-05-14 LAB — RESP PANEL BY RT-PCR (FLU A&B, COVID) ARPGX2
Influenza A by PCR: NEGATIVE
Influenza B by PCR: NEGATIVE
SARS Coronavirus 2 by RT PCR: NEGATIVE

## 2021-05-14 LAB — CBC WITH DIFFERENTIAL/PLATELET
Abs Immature Granulocytes: 0.03 10*3/uL (ref 0.00–0.07)
Basophils Absolute: 0 10*3/uL (ref 0.0–0.1)
Basophils Relative: 0 %
Eosinophils Absolute: 0.2 10*3/uL (ref 0.0–0.5)
Eosinophils Relative: 3 %
HCT: 34.4 % — ABNORMAL LOW (ref 36.0–46.0)
Hemoglobin: 11.1 g/dL — ABNORMAL LOW (ref 12.0–15.0)
Immature Granulocytes: 0 %
Lymphocytes Relative: 18 %
Lymphs Abs: 1.3 10*3/uL (ref 0.7–4.0)
MCH: 30.9 pg (ref 26.0–34.0)
MCHC: 32.3 g/dL (ref 30.0–36.0)
MCV: 95.8 fL (ref 80.0–100.0)
Monocytes Absolute: 0.5 10*3/uL (ref 0.1–1.0)
Monocytes Relative: 7 %
Neutro Abs: 5.2 10*3/uL (ref 1.7–7.7)
Neutrophils Relative %: 72 %
Platelets: 205 10*3/uL (ref 150–400)
RBC: 3.59 MIL/uL — ABNORMAL LOW (ref 3.87–5.11)
RDW: 13.2 % (ref 11.5–15.5)
WBC: 7.3 10*3/uL (ref 4.0–10.5)
nRBC: 0 % (ref 0.0–0.2)

## 2021-05-14 LAB — BASIC METABOLIC PANEL
Anion gap: 7 (ref 5–15)
BUN: 29 mg/dL — ABNORMAL HIGH (ref 8–23)
CO2: 26 mmol/L (ref 22–32)
Calcium: 9.1 mg/dL (ref 8.9–10.3)
Chloride: 103 mmol/L (ref 98–111)
Creatinine, Ser: 0.65 mg/dL (ref 0.44–1.00)
GFR, Estimated: >60 mL/min (ref >60)
Glucose, Bld: 130 mg/dL — ABNORMAL HIGH (ref 70–99)
Potassium: 4.1 mmol/L (ref 3.5–5.1)
Sodium: 136 mmol/L (ref 135–145)

## 2021-05-14 LAB — GLUCOSE, CAPILLARY: Glucose-Capillary: 141 mg/dL — ABNORMAL HIGH (ref 70–99)

## 2021-05-14 MED ORDER — HYDROCHLOROTHIAZIDE 12.5 MG PO TABS
12.5000 mg | ORAL_TABLET | Freq: Every day | ORAL | Status: DC
  Administered 2021-05-15 – 2021-05-19 (×5): 12.5 mg via ORAL
  Filled 2021-05-14 (×6): qty 1

## 2021-05-14 MED ORDER — HALOPERIDOL LACTATE 5 MG/ML IJ SOLN: 2.0000 mg | Freq: Four times a day (QID) | INTRAMUSCULAR | Status: DC | PRN

## 2021-05-14 MED ORDER — VALSARTAN-HYDROCHLOROTHIAZIDE 80-12.5 MG PO TABS: 1.0000 | ORAL_TABLET | Freq: Every day | ORAL | Status: DC

## 2021-05-14 MED ORDER — IRBESARTAN 150 MG PO TABS
75.0000 mg | ORAL_TABLET | Freq: Every day | ORAL | Status: DC
  Administered 2021-05-15 – 2021-05-19 (×5): 75 mg via ORAL
  Filled 2021-05-14 (×6): qty 1

## 2021-05-14 NOTE — Progress Notes (Signed)
Physical Therapy Treatment Patient Details Name: Danielle Nixon MRN: [id number] DOB: 1936-05-29 Today's Date: 05/14/2021   History of Present Illness Danielle Nixon is a 85 y.o. female with history of breast cancer status postlumpectomy and radiation, hypertension, diabetes who presents to the emergency department with her son after she has had several falls over the past few days.    PT Comments    Pt received supine in bed. Found with removal of [place]. Satting at 88-89%. Repositioned in nose on 2 L with improvement in sats > 90%. Educated to maintain in nose due to desaturation. Despite max encouragement, pt politely refusing to attempt sitting EOB or standing. Deferred to bed level therex which pt was agreeable to. Overall pt heavily educated on importance of OOB mobility in order to maintain/improve strength and independence in ADL/IADL completion. Pt hesitant and resistive with all LE exercises requiring cuing to help assist In movement with limited carryover. Pt also educated on importance of position changes to limit risk of skin breakdown. Pt positioned with heels elevated. D/c recs remain appropriate.    Recommendations for follow up therapy are one component of a multi-disciplinary discharge planning process, led by the attending physician.  Recommendations may be updated based on patient status, additional functional criteria and insurance authorization.  Follow Up Recommendations  Skilled nursing-short term rehab (<3 hours/day)     Assistance Recommended at Discharge Frequent or constant Supervision/Assistance  Equipment Recommendations       Recommendations for Other Services       Precautions / Restrictions Precautions Precautions: Fall Restrictions Weight Bearing Restrictions: No     Mobility  Bed Mobility               General bed mobility comments: Remained in supine entirety of session Patient Response: Cooperative;Flat affect  Transfers Overall  transfer level: Needs assistance                 General transfer comment: pt refusing    Ambulation/Gait               General Gait Details: pt refusing   Stairs             Wheelchair Mobility    Modified Rankin (Stroke Patients Only)       Balance       Sitting balance - Comments: did not perform today       Standing balance comment: did not perform today                            Cognition Arousal/Alertness: Awake/alert Behavior During Therapy: WFL for tasks assessed/performed Overall Cognitive Status: No family/caregiver present to determine baseline cognitive functioning                                 General Comments: Continues to require max encouragment just to participate in bed level exercises. Remains confused stating she just got her breaksfast stating it was 1 Pm in afternoon. Required re-orientation that it was still between 10-11 a.m.        Exercises General Exercises - Lower Extremity Ankle Circles/Pumps: AAROM;Both;15 reps;Supine Heel Slides: AAROM;Strengthening;Left;10 reps;Supine;Right Hip ABduction/ADduction: AAROM;Strengthening;Both;10 reps;Supine Other Exercises Other Exercises: significant education during bed exercise on importance of OOB activity for maintaining strength and ultimately independence.    General Comments        Pertinent Vitals/Pain Faces Pain  Scale: Hurts a little bit Pain Location: R knee Pain Descriptors / Indicators: Discomfort Pain Intervention(s): Repositioned;Monitored during session    Home Living                          Prior Function            PT Goals (current goals can now be found in the care plan section) Acute Rehab PT Goals Patient Stated Goal: to get better PT Goal Formulation: With patient Time For Goal Achievement: 05/22/21 Potential to Achieve Goals: Fair Progress towards PT goals: Not progressing toward goals - comment     Frequency    Min 2X/week      PT Plan Current plan remains appropriate    Co-evaluation              AM-PAC PT "6 Clicks" Mobility   Outcome Measure  Help needed turning from your back to your side while in a flat bed without using bedrails?: A Lot Help needed moving from lying on your back to sitting on the side of a flat bed without using bedrails?: A Lot   Help needed standing up from a chair using your arms (e.g., wheelchair or bedside chair)?: Total Help needed to walk in hospital room?: Total Help needed climbing 3-5 steps with a railing? : Total 6 Click Score: 7    End of Session Equipment Utilized During Treatment: Gait belt Activity Tolerance: Patient limited by pain;Patient limited by fatigue Patient left: in bed;with call bell/phone within reach;with bed alarm set   PT Visit Diagnosis: Other abnormalities of gait and mobility (R26.89);Muscle weakness (generalized) (M62.81);Pain Pain - Right/Left: Right Pain - part of body: Knee     Time: 1045-1101 PT Time Calculation (min) (ACUTE ONLY): 16 min  Charges:  $Therapeutic Exercise: 8-22 mins                    Salem Caster. Fairly IV, PT, DPT Physical Therapist- [facility]  05/14/2021, 11:48 AM

## 2021-05-14 NOTE — TOC Progression Note (Signed)
Transition of Care [facility]) - Progression Note    Patient Details  Name: Danielle Nixon MRN: [id number] Date of Birth: 1936/02/10  Transition of Care [facility]) CM/SW Contact  Beverly Sessions, RN Phone Number: 05/14/2021, 9:45 AM  Clinical Narrative:      SNF auth pending for [facility] Expected Discharge Plan: [place] Barriers to Discharge: Continued Medical Work up  Expected Discharge Plan and Services Expected Discharge Plan: [place] Choice: [place] arrangements for the past 2 months: Single Family Home                                       Social Determinants of Health (SDOH) Interventions    Readmission Risk Interventions No flowsheet data found.

## 2021-05-14 NOTE — TOC Progression Note (Signed)
Transition of Care [facility]) - Progression Note    Patient Details  Name: Danielle Nixon MRN: [id number] Date of Birth: May 06, 1936  Transition of Care [facility]) CM/SW Contact  Beverly Sessions, RN Phone Number: 05/14/2021, 4:33 PM  Clinical Narrative:      Notified md insurance has denied SNF.   They are offering a peer to peer with Dr Lynder Parents that has to be completed by noon tomorrow. Phone number to call [phone]   Expected Discharge Plan: [place] Barriers to Discharge: Continued Medical Work up  Expected Discharge Plan and Services Expected Discharge Plan: [place] Choice: [place] arrangements for the past 2 months: Single Family Home                                       Social Determinants of Health (SDOH) Interventions    Readmission Risk Interventions No flowsheet data found.

## 2021-05-14 NOTE — Progress Notes (Signed)
PROGRESS NOTE  Danielle Nixon  DOB: March 29, 1936  PCP: Baxter Hire, MD GXQ:[id number]  DOA: 05/07/2021  LOS: 7 days  Hospital Day: 8  Chief Complaint  Patient presents with   Fall    Brief narrative: Danielle ABBRUZZESE is a 85 y.o. female with PMH significant for DM2, HTN, breast cancer status postlumpectomy and radiation. Patient was brought to the ED from home on 11/24 for evaluation following multiple falls in the last couple of days.  Patient lives alone, uses a rolling walker and a cane for ambulation.  She was having progressive loss of balance and multiple falls leading to bruises in her arms and legs.  Urinalysis showed pyuria Skeletal survey did not show any evidence of fracture. Patient was admitted to hospital service IV antibiotics started for UTI Urine culture grew Proteus penneri. See below for details  Subjective: Patient was seen and examined this morning.  Pleasant elderly Caucasian female.  Propped up in bed.  Not on supplemental oxygen.  Reports an episode of confusion and hallucination this morning.  Otherwise no symptoms.  Assessment/Plan: Proteus penneri UTI -Completed a week course of IV Rocephin.  During the course of antibiotics, patient had breakthrough fevers and hence IV vancomycin was added.  No other source of infection identified.  On 11/30, I stopped IV vancomycin and IV Rocephin.  No fever in last 24 hours without antibiotics.     LLE swelling -has appearance of hematoma as per ultrasound.  This is likely because of fall.    Frequent falls  -Due to impaired mobility.  PT eval obtained.  SNF placement recommended.    DM2 -Diet controlled, HbA1c 6.4.   Sinus tachycardia Essential hypertension -Heart rate improving after Coreg 3.125 mg twice daily was started yesterday.  We will resume valsartan HCTZ today.   Thrombocytopenia -resolved  Constipation -Continue bowel regimen with Senokot scheduled and MiraLAX as needed.   Mobility:  Encourage ambulation  Living condition: was living at home. Goals of care:   Code Status: Full Code  Nutritional status: Body mass index is 29.14 kg/m.     Diet:  Diet Order             Diet heart healthy/carb modified Room service appropriate? Yes; Fluid consistency: Thin  Diet effective now                  DVT prophylaxis:  enoxaparin (LOVENOX) injection 40 mg Start: 05/13/21 2200   Antimicrobials: None currently Fluid: IV fluids stopped Consultants: None Family Communication: None at bedside  Status is: Inpatient  Remains inpatient appropriate because: Continue to monitor off antibiotics.  Pending SNF  Dispo: The patient is from: Home              Anticipated d/c is to: SNF              Patient currently is medically stable to d/c.   Difficult to place patient No     Infusions:     Scheduled Meds:  amitriptyline  50 mg Oral QHS   atorvastatin  10 mg Oral QPM   carvedilol  3.125 mg Oral BID WC   enoxaparin (LOVENOX) injection  40 mg Subcutaneous Q24H   mouth rinse  15 mL Mouth Rinse BID   polyethylene glycol  17 g Oral Daily   senna-docusate  1 tablet Oral QHS    PRN meds: acetaminophen, ondansetron **OR** ondansetron (ZOFRAN) IV   Antimicrobials: Anti-infectives (From admission, onward)    Start  Dose/Rate Route Frequency Ordered Stop   05/12/21 1504  vancomycin variable dose per unstable renal function (pharmacist dosing)  Status:  Discontinued         Does not apply See admin instructions 05/12/21 1505 05/13/21 0836   05/11/21 2300  vancomycin (VANCOREADY) IVPB 1500 mg/300 mL  Status:  Discontinued        1,500 mg 150 mL/hr over 120 Minutes Intravenous Every 24 hours 05/10/21 2209 05/11/21 1246   05/11/21 2300  vancomycin (VANCOREADY) IVPB 1250 mg/250 mL  Status:  Discontinued        1,250 mg 166.7 mL/hr over 90 Minutes Intravenous Every 24 hours 05/11/21 1246 05/12/21 1505   05/10/21 2300  vancomycin (VANCOREADY) IVPB 2000 mg/400 mL         2,000 mg 200 mL/hr over 120 Minutes Intravenous  Once 05/10/21 2200 05/11/21 0217   05/09/21 1000  cefTRIAXone (ROCEPHIN) 1 g in sodium chloride 0.9 % 100 mL IVPB        1 g 200 mL/hr over 30 Minutes Intravenous Every 24 hours 05/08/21 1351 05/13/21 0945   05/08/21 0600  cefTRIAXone (ROCEPHIN) 1 g in sodium chloride 0.9 % 100 mL IVPB        1 g 200 mL/hr over 30 Minutes Intravenous  Once 05/07/21 1001 05/08/21 1012   05/07/21 0515  cefTRIAXone (ROCEPHIN) 1 g in sodium chloride 0.9 % 100 mL IVPB        1 g 200 mL/hr over 30 Minutes Intravenous  Once 05/07/21 0506 05/07/21 0631       Objective: Vitals:   05/14/21 0449 05/14/21 0729  BP: (!) 148/81 (!) 155/90  Pulse: 97 94  Resp: 18 16  Temp: 98 F (36.7 C) 98 F (36.7 C)  SpO2: 98% 98%    Intake/Output Summary (Last 24 hours) at 05/14/2021 1318 Last data filed at 05/14/2021 0600 Gross per 24 hour  Intake 2699.24 ml  Output 1100 ml  Net 1599.24 ml    Filed Weights   05/06/21 1914 05/08/21 0400  Weight: 90.7 kg 89.5 kg   Weight change:  Body mass index is 29.14 kg/m.   Physical Exam: General exam: Pleasant elderly Caucasian female.  Propped up in bed.  Not in distress Skin: No rashes, lesions or ulcers. HEENT: Atraumatic, normocephalic, no obvious bleeding Lungs: Clear to auscultation bilaterally CVS: Mild tachycardia, regular rhythm, no murmur GI/Abd soft, nontender, nondistended, bowel sound present CNS: Alert, awake, oriented x3 Psychiatry: Mood appropriate Extremities: No pedal edema, no calf tenderness  Data Review: I have personally reviewed the laboratory data and studies available.  F/u labs ordered Unresulted Labs (From admission, onward)     Start     Ordered   05/14/21 0500  Creatinine, serum  (enoxaparin (LOVENOX)    CrCl >/= 30 ml/min)  Weekly,   STAT     Comments: while on enoxaparin therapy    05/07/21 1000   05/14/21 0500  CBC with Differential/Platelet  Daily,   R     Question:  Specimen  collection method  Answer:  Lab=Lab collect   05/13/21 1556   05/14/21 0350  Basic metabolic panel  Daily,   R     Question:  Specimen collection method  Answer:  Lab=Lab collect   05/13/21 1556            Signed, Terrilee Croak, MD [facility] 05/14/2021

## 2021-05-14 NOTE — Progress Notes (Signed)
Agree with Manuela Schwartz, RN am assessment.

## 2021-05-14 NOTE — Progress Notes (Signed)
Patient is refusing her medications. She also does not want her oxygen via nasal canula. She is constantly climbing out of bed and being very aggressive and combative. She has pulled out her IV. I asked MD if he would like [place] to place another IV line given the fact that she is still getting IV antibiotics. Also asked if we could have a sitter for her.

## 2021-05-14 NOTE — TOC Progression Note (Signed)
Transition of Care [facility]) - Progression Note    Patient Details  Name: Danielle Nixon MRN: [id number] Date of Birth: 1936/01/24  [facility]) CM/SW Contact  Beverly Sessions, RN Phone Number: 05/14/2021, 3:10 PM  Clinical Narrative:     Patient now combative with mittens on MD aware that patient will have to be mitten free for 24 hours prior to discharge to SNF   Received call from [place] at [facility].  She states that it is going to medical review, and she anticipates that case will be denied  Expected Discharge Plan: [place] Barriers to Discharge: Continued Medical Work up  Expected Discharge Plan and Services Expected Discharge Plan: [place] Choice: [place] arrangements for the past 2 months: Single Family Home                                       Social Determinants of Health (SDOH) Interventions    Readmission Risk Interventions No flowsheet data found.

## 2021-05-15 DIAGNOSIS — N3000 Acute cystitis without hematuria: Secondary | ICD-10-CM | POA: Diagnosis not present

## 2021-05-15 LAB — CBC WITH DIFFERENTIAL/PLATELET
Abs Immature Granulocytes: 0.02 10*3/uL (ref 0.00–0.07)
Basophils Absolute: 0 10*3/uL (ref 0.0–0.1)
Basophils Relative: 0 %
Eosinophils Absolute: 0 10*3/uL (ref 0.0–0.5)
Eosinophils Relative: 0 %
HCT: 35.6 % — ABNORMAL LOW (ref 36.0–46.0)
Hemoglobin: 11.9 g/dL — ABNORMAL LOW (ref 12.0–15.0)
Immature Granulocytes: 0 %
Lymphocytes Relative: 19 %
Lymphs Abs: 1.4 10*3/uL (ref 0.7–4.0)
MCH: 30.9 pg (ref 26.0–34.0)
MCHC: 33.4 g/dL (ref 30.0–36.0)
MCV: 92.5 fL (ref 80.0–100.0)
Monocytes Absolute: 0.5 10*3/uL (ref 0.1–1.0)
Monocytes Relative: 7 %
Neutro Abs: 5.4 10*3/uL (ref 1.7–7.7)
Neutrophils Relative %: 74 %
Platelets: 238 10*3/uL (ref 150–400)
RBC: 3.85 MIL/uL — ABNORMAL LOW (ref 3.87–5.11)
RDW: 13.1 % (ref 11.5–15.5)
WBC: 7.4 10*3/uL (ref 4.0–10.5)
nRBC: 0 % (ref 0.0–0.2)

## 2021-05-15 LAB — BASIC METABOLIC PANEL
Anion gap: 9 (ref 5–15)
BUN: 24 mg/dL — ABNORMAL HIGH (ref 8–23)
CO2: 26 mmol/L (ref 22–32)
Calcium: 9.3 mg/dL (ref 8.9–10.3)
Chloride: 101 mmol/L (ref 98–111)
Creatinine, Ser: 0.64 mg/dL (ref 0.44–1.00)
GFR, Estimated: >60 mL/min (ref >60)
Glucose, Bld: 133 mg/dL — ABNORMAL HIGH (ref 70–99)
Potassium: 3.6 mmol/L (ref 3.5–5.1)
Sodium: 136 mmol/L (ref 135–145)

## 2021-05-15 LAB — GLUCOSE, CAPILLARY
Glucose-Capillary: 131 mg/dL — ABNORMAL HIGH (ref 70–99)
Glucose-Capillary: 136 mg/dL — ABNORMAL HIGH (ref 70–99)
Glucose-Capillary: 146 mg/dL — ABNORMAL HIGH (ref 70–99)

## 2021-05-15 NOTE — Progress Notes (Signed)
Occupational Therapy Treatment Patient Details Name: Danielle Nixon MRN: [id number] DOB: 1936-03-26 Today's Date: 05/15/2021   History of present illness Danielle Nixon is a 85 y.o. female with history of breast cancer status postlumpectomy and radiation, hypertension, diabetes who presents to the emergency department with her son after she has had several falls over the past few days.   OT comments  [name] seen for OT/PT co-treatment on this date. Upon arrival to room pt awake/alert and lying in bed. Pt agreeable to tx. Pt instructed in importance of movement for functional mobility and verbalized understanding of instruction provided. Pt SETUP for grooming facewashing and hairbrushing, seated in chair. Pt SETUP for UBD, donning gown, seated in chair. Pt MOD A x2 + RW for functional mobility, t/f bed to chair from raised surface.  Pt making good progress toward goals. Pt continues to benefit from skilled OT services to maximize return to PLOF and minimize risk of future falls, injury, caregiver burden, and readmission. Will continue to follow POC. Discharge recommendation remains appropriate.     Recommendations for follow up therapy are one component of a multi-disciplinary discharge planning process, led by the attending physician.  Recommendations may be updated based on patient status, additional functional criteria and insurance authorization.    Follow Up Recommendations  Skilled nursing-short term rehab (<3 hours/day)    Assistance Recommended at Discharge Frequent or constant Supervision/Assistance  Equipment Recommendations  Other (comment) (defer to next venue of care)    Recommendations for Other Services      Precautions / Restrictions Precautions Precautions: Fall Restrictions Weight Bearing Restrictions: No       Mobility Bed Mobility Overal bed mobility: Needs Assistance Bed Mobility: Supine to Sit     Supine to sit: Mod assist;HOB elevated      General bed mobility comments: HOB maximally elevated requiring cuing for hand placement and modA at trunk to sit upright    Transfers Overall transfer level: Needs assistance Equipment used: Rolling walker (2 wheels) Transfers: Bed to chair/wheelchair/BSC Sit to Stand: Mod assist;+2 physical assistance;From elevated surface           General transfer comment: CUing for hand placement with poor carryover. Requires use of momentum and multiple attempts to get into standing with modA+2     Balance Overall balance assessment: Needs assistance;History of Falls Sitting-balance support: Feet supported;Bilateral upper extremity supported Sitting balance-Leahy Scale: Fair  Postural control: Right lateral lean;Posterior lean Standing balance support: Reliant on assistive device for balance;Bilateral upper extremity supported Standing balance-Leahy Scale: Fair                            ADL either performed or assessed with clinical judgement   ADL Overall ADL's : Needs assistance/impaired                                       General ADL Comments: Pt SETUP for grooming facewashing and hairbrushing, seated in chair. Pt SETUP for UBD, donning gown, seated in chair. Pt MOD A x2 + RW for functional mobility, t/f bed to chair from raised surface.    Extremity/Trunk Assessment               Cognition Arousal/Alertness: Awake/alert Behavior During Therapy: WFL for tasks assessed/performed Overall Cognitive Status: No family/caregiver present to determine baseline cognitive functioning  General Comments: Educated on importance of OOB mobility to get approved for SNF and for overall independence. Pt understanding and displayed improved motivation to participate.          Exercises Exercises: Other exercises General Exercises - Lower Extremity Long Arc Quad: AROM;Strengthening;Both;10 reps;Seated Hip  ABduction/ADduction: AROM;Seated;Both;10 reps (isometrics) Hip Flexion/Marching: AROM;Both;10 reps;Seated Toe Raises: AROM;Seated;Strengthening;Both;10 reps Heel Raises: AROM;Seated;Strengthening;10 reps;Both Other Exercises Other Exercises: Pt educ re: OT role, DME use, falls prevention, importance of movement for functional mobility, d/c recs Other Exercises: Sup<>sit, sit<>stand, grooming, UBD   Shoulder Instructions       General Comments      Pertinent Vitals/ Pain       Pain Assessment: Faces Faces Pain Scale: Hurts a little bit Pain Location: R knee Pain Descriptors / Indicators: Discomfort Pain Intervention(s): Monitored during session  Home Living                                          Prior Functioning/Environment              Frequency  Min 2X/week        Progress Toward Goals  OT Goals(current goals can now be found in the care plan section)  Progress towards OT goals: Progressing toward goals  Acute Rehab OT Goals Patient Stated Goal: to get better OT Goal Formulation: With patient Time For Goal Achievement: 05/22/21 Potential to Achieve Goals: Good ADL Goals Pt Will Perform Grooming: with mod assist;standing Pt Will Perform Lower Body Dressing: sitting/lateral leans;with supervision Pt Will Transfer to Toilet: with min assist;ambulating;bedside commode  Plan Discharge plan remains appropriate;Frequency remains appropriate    Co-evaluation    PT/OT/SLP Co-Evaluation/Treatment: Yes Reason for Co-Treatment: To address functional/ADL transfers;For patient/therapist safety;Necessary to address cognition/behavior during functional activity PT goals addressed during session: Mobility/safety with mobility;Strengthening/ROM OT goals addressed during session: ADL's and self-care      AM-PAC OT "6 Clicks" Daily Activity     Outcome Measure   Help from another person eating meals?: None Help from another person taking care of  personal grooming?: A Little Help from another person toileting, which includes using toliet, bedpan, or urinal?: A Lot Help from another person bathing (including washing, rinsing, drying)?: A Lot Help from another person to put on and taking off regular upper body clothing?: A Little Help from another person to put on and taking off regular lower body clothing?: A Lot 6 Click Score: 16    End of Session Equipment Utilized During Treatment: Gait belt;Rolling walker (2 wheels)  OT Visit Diagnosis: Other abnormalities of gait and mobility (R26.89)   Activity Tolerance Patient tolerated treatment well   Patient Left in chair;with call bell/phone within reach;with chair alarm set   Nurse Communication          Time: 8366-2947 OT Time Calculation (min): 31 min  Charges: OT General Charges $OT Visit: 1 Visit OT Treatments $Self Care/Home Management : 8-22 mins  Nino Glow, Markus Daft 05/15/2021, 1:11 PM

## 2021-05-15 NOTE — Progress Notes (Signed)
Physical Therapy Treatment Patient Details Name: Danielle Nixon MRN: [id number] DOB: 28-Mar-1936 Today's Date: 05/15/2021   History of Present Illness Danielle Nixon is a 85 y.o. female with history of breast cancer status postlumpectomy and radiation, hypertension, diabetes who presents to the emergency department with her son after she has had several falls over the past few days.    PT Comments    Pt received in bed with OT. No [place] present today satting at 94% on RA. Pt educated and encouraged to further participation with therapy to maintain strength/mobility for ADL completion. Pt appears more motivated and willing to participate after conversation. Ultimately requires modA+1 with mod Multimodal cues for hand placement and sequencing of LE's to EOB requiring HOB maximally elevated to transfer to EOB. ModA+1 provided at torso.  Maintains static sitting balance with intermittent R lat lean and post lean requiring min VC's and TC's at shoulders to return to neutral. With bed elevated, pt stands to RW with +2 modA with poor hand placement despite VC's. Tolerated upright mobility for ~2 min with shuffling gait to recliner requiring hands on RW for safe sequencing with poor eccentric control sitting into chair however no buckling of knees with standing and walking. Tolerated LE therex with cuing and demo for improved muscle activation for strength gains while OT gathering items for their treatment. Post activity SPO2 remained > 92% on RA. Pt left in recliner in care of OT. Will continue to benefit from STR after current hospitalization due to reliance of level of assist for bed mobility and standing transfers and deficits in general deconditioning.    Recommendations for follow up therapy are one component of a multi-disciplinary discharge planning process, led by the attending physician.  Recommendations may be updated based on patient status, additional functional criteria and insurance  authorization.  Follow Up Recommendations  Skilled nursing-short term rehab (<3 hours/day)     Assistance Recommended at Discharge Frequent or constant Supervision/Assistance  Equipment Recommendations   (tbd by next venue of care)    Recommendations for Other Services       Precautions / Restrictions Precautions Precautions: Fall Restrictions Weight Bearing Restrictions: No     Mobility  Bed Mobility Overal bed mobility: Needs Assistance Bed Mobility: Supine to Sit     Supine to sit: Mod assist;HOB elevated     General bed mobility comments: HOB maximally elevated requiring cuing for hand placement and modA at trunk to sit upright Patient Response: Cooperative;Flat affect  Transfers Overall transfer level: Needs assistance Equipment used: Rolling walker (2 wheels) Transfers: Bed to chair/wheelchair/BSC Sit to Stand: Mod assist;+2 physical assistance;From elevated surface           General transfer comment: CUing for hand placement with poor carryover. Requires use of momentum and multiple attempts to get into standing with modA+2    Ambulation/Gait Ambulation/Gait assistance: Min guard Gait Distance (Feet): 2 Feet Assistive device: Rolling walker (2 wheels) Gait Pattern/deviations: Narrow base of support;Trunk flexed;Shuffle;Decreased step length - right;Decreased stance time - left       General Gait Details: shuffling pattern to recliner. No buckling of LE's. Approximately standing/ambulating total of 2 min to get into chair.   Stairs             Wheelchair Mobility    Modified Rankin (Stroke Patients Only)       Balance Overall balance assessment: Needs assistance;History of Falls Sitting-balance support: Feet supported;Bilateral upper extremity supported Sitting balance-Leahy Scale: Fair Sitting balance - Comments:  Tolerated static sitting with intermittent posterior and R lat lean requiring VC"s and TC's on shoulder to  correct. Postural control: Right lateral lean;Posterior lean Standing balance support: Reliant on assistive device for balance;Bilateral upper extremity supported Standing balance-Leahy Scale: Fair Standing balance comment: Stands requiring BUE support on RW. No buckling of knees or LOB.                            Cognition Arousal/Alertness: Awake/alert Behavior During Therapy: WFL for tasks assessed/performed Overall Cognitive Status: No family/caregiver present to determine baseline cognitive functioning                                 General Comments: Educated on importance of OOB mobility to get approved for SNF and for overall independence. Pt understanding and displayed improved motivation to participate.        Exercises General Exercises - Lower Extremity Long Arc Quad: AROM;Strengthening;Both;10 reps;Seated Hip ABduction/ADduction: AROM;Seated;Both;10 reps (isometrics) Hip Flexion/Marching: AROM;Both;10 reps;Seated Toe Raises: AROM;Seated;Strengthening;Both;10 reps Heel Raises: AROM;Seated;Strengthening;10 reps;Both Other Exercises Other Exercises: Further education on importance of OOB mobility for strength gains, to maintain independence and need to display motivation and progress in order to get accepted to STR. Exercise form/technique.    General Comments        Pertinent Vitals/Pain Pain Assessment: Faces Faces Pain Scale: Hurts a little bit Breathing: normal Negative Vocalization: occasional moan/groan, low speech, negative/disapproving quality Facial Expression: facial grimacing Body Language: relaxed Consolability: distracted or reassured by voice/touch PAINAD Score: 4 Pain Location: R knee Pain Descriptors / Indicators: Discomfort Pain Intervention(s): Monitored during session;Repositioned    Home Living                          Prior Function            PT Goals (current goals can now be found in the care plan  section) Acute Rehab PT Goals Patient Stated Goal: to get better PT Goal Formulation: With patient Time For Goal Achievement: 05/22/21 Potential to Achieve Goals: Fair Progress towards PT goals: Progressing toward goals    Frequency    Min 2X/week      PT Plan Current plan remains appropriate    Co-evaluation              AM-PAC PT "6 Clicks" Mobility   Outcome Measure  Help needed turning from your back to your side while in a flat bed without using bedrails?: A Lot Help needed moving from lying on your back to sitting on the side of a flat bed without using bedrails?: A Lot Help needed moving to and from a bed to a chair (including a wheelchair)?: A Lot Help needed standing up from a chair using your arms (e.g., wheelchair or bedside chair)?: A Lot Help needed to walk in hospital room?: A Lot Help needed climbing 3-5 steps with a railing? : Total 6 Click Score: 11    End of Session Equipment Utilized During Treatment: Gait belt Activity Tolerance: Patient tolerated treatment well Patient left: with call bell/phone within reach;in chair (present with OT) Nurse Communication: Mobility status PT Visit Diagnosis: Other abnormalities of gait and mobility (R26.89);Muscle weakness (generalized) (M62.81);Pain Pain - Right/Left: Right Pain - part of body: Knee     Time: 5732-2025 PT Time Calculation (min) (ACUTE ONLY): 19 min  Charges:  $Therapeutic  Exercise: 8-22 mins                     Salem Caster. Fairly IV, PT, DPT Physical Therapist- [facility]  05/15/2021, 12:10 PM

## 2021-05-15 NOTE — Progress Notes (Signed)
PROGRESS NOTE  Danielle Nixon  DOB: 23-Mar-1936  PCP: Baxter Hire, MD DXI:[id number]  DOA: 05/07/2021  LOS: 8 days  Hospital Day: 9  Chief Complaint  Patient presents with   Fall    Brief narrative: BLU MCGLAUN is a 85 y.o. female with PMH significant for DM2, HTN, breast cancer status postlumpectomy and radiation. Patient was brought to the ED from home on 11/24 for evaluation following multiple falls in the last couple of days.  Patient lives alone, uses a rolling walker and a cane for ambulation.  She was having progressive loss of balance and multiple falls leading to bruises in her arms and legs.  Urinalysis showed pyuria Skeletal survey did not show any evidence of fracture. Patient was admitted to hospital service IV antibiotics started for UTI Urine culture grew Proteus penneri. See below for details  Subjective: Patient was seen and examined this morning.   Lying on bed.  Not in distress.  Slow to respond today.   Events from last 24 hours noted.  Patient was confused yesterday afternoon.  She was agitated and was pulling out IV lines.  Needed reorientation.  Assessment/Plan: Proteus penneri UTI -Completed a week course of IV Rocephin.  During the course of antibiotics, patient had breakthrough fevers and hence IV vancomycin was added.  No other source of infection identified.  On 11/30, I stopped IV vancomycin and IV Rocephin.  No fever in last 48 hours without antibiotics.    Acute metabolic encephalopathy -Acute delirium likely due to prolonged hospitalization. -Not restless or agitated this morning.  Continue reorientation efforts.   LLE swelling -has appearance of hematoma as per ultrasound.  This is likely because of fall.    DM2 -Diet controlled, HbA1c 6.4.   Sinus tachycardia Essential hypertension -Heart rate improving after Coreg 3.125 mg twice daily was started yesterday.  We will resume valsartan HCTZ today.    Thrombocytopenia -resolved  Constipation -Continue bowel regimen with Senokot scheduled and MiraLAX as needed.  Frequent falls  -Due to impaired mobility and UTI.  PT eval obtained.  SNF placement recommended.  Patient was not participating with therapy for last few days.  Insurance denied SNF placement.  I had a long conversation with patient this morning.  She agreed to participate with therapy.  Seen by PT again this afternoon.  I did a peer to peer with insurance today.  To be reviewed again for authorization this afternoon.   Mobility: Encourage ambulation  Living condition: was living at home. Goals of care:   Code Status: Full Code  Nutritional status: Body mass index is 29.14 kg/m.     Diet:  Diet Order             Diet heart healthy/carb modified Room service appropriate? Yes; Fluid consistency: Thin  Diet effective now                  DVT prophylaxis:  enoxaparin (LOVENOX) injection 40 mg Start: 05/13/21 2200   Antimicrobials: None currently Fluid: IV fluids stopped Consultants: None Family Communication: None at bedside  Status is: Inpatient  Remains inpatient appropriate because: Continue to monitor off antibiotics.  Peer-to-peer done today.  Pending insurance review for SNF authorization. Dispo: The patient is from: Home              Anticipated d/c is to: SNF              Patient currently is medically stable to d/c.  Difficult to place patient No     Infusions:     Scheduled Meds:  amitriptyline  50 mg Oral QHS   atorvastatin  10 mg Oral QPM   carvedilol  3.125 mg Oral BID WC   enoxaparin (LOVENOX) injection  40 mg Subcutaneous Q24H   irbesartan  75 mg Oral Daily   And   hydrochlorothiazide  12.5 mg Oral Daily   mouth rinse  15 mL Mouth Rinse BID   polyethylene glycol  17 g Oral Daily   senna-docusate  1 tablet Oral QHS    PRN meds: acetaminophen, haloperidol lactate, ondansetron **OR** ondansetron (ZOFRAN) IV    Antimicrobials: Anti-infectives (From admission, onward)    Start     Dose/Rate Route Frequency Ordered Stop   05/12/21 1504  vancomycin variable dose per unstable renal function (pharmacist dosing)  Status:  Discontinued         Does not apply See admin instructions 05/12/21 1505 05/13/21 0836   05/11/21 2300  vancomycin (VANCOREADY) IVPB 1500 mg/300 mL  Status:  Discontinued        1,500 mg 150 mL/hr over 120 Minutes Intravenous Every 24 hours 05/10/21 2209 05/11/21 1246   05/11/21 2300  vancomycin (VANCOREADY) IVPB 1250 mg/250 mL  Status:  Discontinued        1,250 mg 166.7 mL/hr over 90 Minutes Intravenous Every 24 hours 05/11/21 1246 05/12/21 1505   05/10/21 2300  vancomycin (VANCOREADY) IVPB 2000 mg/400 mL        2,000 mg 200 mL/hr over 120 Minutes Intravenous  Once 05/10/21 2200 05/11/21 0217   05/09/21 1000  cefTRIAXone (ROCEPHIN) 1 g in sodium chloride 0.9 % 100 mL IVPB        1 g 200 mL/hr over 30 Minutes Intravenous Every 24 hours 05/08/21 1351 05/13/21 0945   05/08/21 0600  cefTRIAXone (ROCEPHIN) 1 g in sodium chloride 0.9 % 100 mL IVPB        1 g 200 mL/hr over 30 Minutes Intravenous  Once 05/07/21 1001 05/08/21 1012   05/07/21 0515  cefTRIAXone (ROCEPHIN) 1 g in sodium chloride 0.9 % 100 mL IVPB        1 g 200 mL/hr over 30 Minutes Intravenous  Once 05/07/21 0506 05/07/21 0631       Objective: Vitals:   05/15/21 0825 05/15/21 1101  BP: 129/66 134/90  Pulse: (!) 109 93  Resp: 18 18  Temp: 98 F (36.7 C) (!) 97.2 F (36.2 C)  SpO2: 93% 95%    Intake/Output Summary (Last 24 hours) at 05/15/2021 1413 Last data filed at 05/15/2021 1040 Gross per 24 hour  Intake 60 ml  Output 550 ml  Net -490 ml    Filed Weights   05/06/21 1914 05/08/21 0400  Weight: 90.7 kg 89.5 kg   Weight change:  Body mass index is 29.14 kg/m.   Physical Exam: General exam: Pleasant elderly Caucasian female.  Propped up in bed.  Not in physical distress Skin: No rashes, lesions  or ulcers. HEENT: Atraumatic, normocephalic, no obvious bleeding Lungs: Clear to auscultation bilaterally CVS: Mild tachycardia, regular rhythm, no murmur GI/Abd soft, nontender, nondistended, bowel sound present CNS: Alert, awake, oriented to place Psychiatry: Mood appropriate Extremities: No pedal edema, no calf tenderness  Data Review: I have personally reviewed the laboratory data and studies available.  F/u labs ordered Unresulted Labs (From admission, onward)     Start     Ordered   05/14/21 0500  Creatinine, serum  (enoxaparin (LOVENOX)  CrCl >/= 30 ml/min)  Weekly,   STAT     Comments: while on enoxaparin therapy    05/07/21 1000   05/14/21 0500  CBC with Differential/Platelet  Daily,   R     Question:  Specimen collection method  Answer:  Lab=Lab collect   05/13/21 1556   05/14/21 0601  Basic metabolic panel  Daily,   R     Question:  Specimen collection method  Answer:  Lab=Lab collect   05/13/21 1556            Signed, Terrilee Croak, MD [facility] 05/15/2021

## 2021-05-15 NOTE — TOC Progression Note (Addendum)
Transition of Care [facility]) - Progression Note    Patient Details  Name: RAMI WADDLE MRN: [id number] Date of Birth: May 02, 1936  [facility] of [facility]) CM/SW [place], LCSW Phone Number: 05/15/2021, 12:08 PM  Clinical Narrative:     Damaris Schooner with Colletta Maryland with [facility]. Informed her per MD peer to peer was done and waiting on updated PT OT notes which are being completed now. Colletta Maryland stated she will call this CSW back with an update.   2:55- Call from [place] with [facility]. Peer to peer has been approved. [facility] [phone]. SN[facility] [phone]. Called Merchant navy officer at [facility]. She stated she needs to know if patient has been mitten free 24 hours and if she is having behaviors. Asked MD and RN.   3:25- Per RN no behaviors today and patient has been mitten free 24 hours. Informed Merchant navy officer at [facility]. Per [facility]ne [facility] cannot take patient until Monday due to staffing.  Expected Discharge Plan: [place] Barriers to Discharge: Continued Medical Work up  Expected Discharge Plan and Services Expected Discharge Plan: [place] Choice: [place] arrangements for the past 2 months: Single Family Home                                       Social Determinants of Health (SDOH) Interventions    Readmission Risk Interventions No flowsheet data found.

## 2021-05-16 DIAGNOSIS — N3000 Acute cystitis without hematuria: Secondary | ICD-10-CM | POA: Diagnosis not present

## 2021-05-16 LAB — CBC WITH DIFFERENTIAL/PLATELET
Abs Immature Granulocytes: 0.02 10*3/uL (ref 0.00–0.07)
Basophils Absolute: 0.1 10*3/uL (ref 0.0–0.1)
Basophils Relative: 1 %
Eosinophils Absolute: 0.5 10*3/uL (ref 0.0–0.5)
Eosinophils Relative: 6 %
HCT: 37.4 % (ref 36.0–46.0)
Hemoglobin: 12.1 g/dL (ref 12.0–15.0)
Immature Granulocytes: 0 %
Lymphocytes Relative: 29 %
Lymphs Abs: 2.3 10*3/uL (ref 0.7–4.0)
MCH: 30.6 pg (ref 26.0–34.0)
MCHC: 32.4 g/dL (ref 30.0–36.0)
MCV: 94.4 fL (ref 80.0–100.0)
Monocytes Absolute: 0.6 10*3/uL (ref 0.1–1.0)
Monocytes Relative: 7 %
Neutro Abs: 4.5 10*3/uL (ref 1.7–7.7)
Neutrophils Relative %: 57 %
Platelets: 277 10*3/uL (ref 150–400)
RBC: 3.96 MIL/uL (ref 3.87–5.11)
RDW: 13 % (ref 11.5–15.5)
WBC: 8 10*3/uL (ref 4.0–10.5)
nRBC: 0 % (ref 0.0–0.2)

## 2021-05-16 LAB — CULTURE, BLOOD (ROUTINE X 2)
Culture: NO GROWTH
Culture: NO GROWTH
Special Requests: ADEQUATE
Special Requests: ADEQUATE

## 2021-05-16 LAB — BASIC METABOLIC PANEL
Anion gap: 11 (ref 5–15)
BUN: 38 mg/dL — ABNORMAL HIGH (ref 8–23)
CO2: 27 mmol/L (ref 22–32)
Calcium: 9.5 mg/dL (ref 8.9–10.3)
Chloride: 100 mmol/L (ref 98–111)
Creatinine, Ser: 0.86 mg/dL (ref 0.44–1.00)
GFR, Estimated: >60 mL/min (ref >60)
Glucose, Bld: 123 mg/dL — ABNORMAL HIGH (ref 70–99)
Potassium: 3.5 mmol/L (ref 3.5–5.1)
Sodium: 138 mmol/L (ref 135–145)

## 2021-05-16 LAB — GLUCOSE, CAPILLARY: Glucose-Capillary: 127 mg/dL — ABNORMAL HIGH (ref 70–99)

## 2021-05-16 NOTE — Progress Notes (Signed)
PROGRESS NOTE  Danielle Nixon  DOB: 1936/01/24  PCP: Baxter Hire, MD NOM:[id number]  DOA: 05/07/2021  LOS: 9 days  Hospital Day: 4  Chief Complaint  Patient presents with   Fall    Brief narrative: Danielle Nixon is a 85 y.o. female with PMH significant for DM2, HTN, breast cancer status postlumpectomy and radiation. Patient was brought to the ED from home on 11/24 for evaluation following multiple falls in the last couple of days.  Patient lives alone, uses a rolling walker and a cane for ambulation.  She was having progressive loss of balance and multiple falls leading to bruises in her arms and legs.  Urinalysis showed pyuria Skeletal survey did not show any evidence of fracture. Patient was admitted to hospital service IV antibiotics started for UTI Urine culture grew Proteus penneri. See below for details  Subjective: Patient was seen and examined this morning.   Alert, awake, oriented to place person and time this morning but physically seems weak.  Assessment/Plan: Proteus penneri UTI -Completed a week course of IV Rocephin.  During the course of antibiotics, patient had breakthrough fevers and hence IV vancomycin was added.  No other source of infection identified.  On 11/30, I stopped IV vancomycin and IV Rocephin.  No fever since without antibiotics.    Acute metabolic encephalopathy -Intermittent acute delirium likely due to prolonged hospitalization. -This morning, patient remains alert, awake, oriented x3.    LLE swelling -has appearance of hematoma as per ultrasound.  This is likely because of fall.    DM2 -Diet controlled, HbA1c 6.4.   Sinus tachycardia Essential hypertension -Heart rate and blood pressure stable on Coreg, irbesartan and HCTZ.    Thrombocytopenia -resolved  Constipation -Continue bowel regimen with Senokot scheduled and MiraLAX as needed.  Frequent falls  -Due to impaired mobility and UTI.  PT eval obtained.  SNF  placement recommended.   -Likely for discharge Monday.   Mobility: Encourage ambulation  Living condition: was living at home. Goals of care:   Code Status: Full Code  Nutritional status: Body mass index is 29.14 kg/m.     Diet:  Diet Order             Diet heart healthy/carb modified Room service appropriate? Yes; Fluid consistency: Thin  Diet effective now                  DVT prophylaxis:  enoxaparin (LOVENOX) injection 40 mg Start: 05/13/21 2200   Antimicrobials: None currently Fluid: IV fluids stopped Consultants: None Family Communication: None at bedside  Status is: Inpatient  Remains inpatient appropriate because: Pending SNF placement Dispo: The patient is from: Home              Anticipated d/c is to: SNF              Patient currently is medically stable to d/c.   Difficult to place patient No     Infusions:     Scheduled Meds:  amitriptyline  50 mg Oral QHS   atorvastatin  10 mg Oral QPM   carvedilol  3.125 mg Oral BID WC   enoxaparin (LOVENOX) injection  40 mg Subcutaneous Q24H   irbesartan  75 mg Oral Daily   And   hydrochlorothiazide  12.5 mg Oral Daily   mouth rinse  15 mL Mouth Rinse BID   polyethylene glycol  17 g Oral Daily   senna-docusate  1 tablet Oral QHS    PRN meds:  acetaminophen, haloperidol lactate, ondansetron **OR** ondansetron (ZOFRAN) IV   Antimicrobials: Anti-infectives (From admission, onward)    Start     Dose/Rate Route Frequency Ordered Stop   05/12/21 1504  vancomycin variable dose per unstable renal function (pharmacist dosing)  Status:  Discontinued         Does not apply See admin instructions 05/12/21 1505 05/13/21 0836   05/11/21 2300  vancomycin (VANCOREADY) IVPB 1500 mg/300 mL  Status:  Discontinued        1,500 mg 150 mL/hr over 120 Minutes Intravenous Every 24 hours 05/10/21 2209 05/11/21 1246   05/11/21 2300  vancomycin (VANCOREADY) IVPB 1250 mg/250 mL  Status:  Discontinued        1,250 mg 166.7  mL/hr over 90 Minutes Intravenous Every 24 hours 05/11/21 1246 05/12/21 1505   05/10/21 2300  vancomycin (VANCOREADY) IVPB 2000 mg/400 mL        2,000 mg 200 mL/hr over 120 Minutes Intravenous  Once 05/10/21 2200 05/11/21 0217   05/09/21 1000  cefTRIAXone (ROCEPHIN) 1 g in sodium chloride 0.9 % 100 mL IVPB        1 g 200 mL/hr over 30 Minutes Intravenous Every 24 hours 05/08/21 1351 05/13/21 0945   05/08/21 0600  cefTRIAXone (ROCEPHIN) 1 g in sodium chloride 0.9 % 100 mL IVPB        1 g 200 mL/hr over 30 Minutes Intravenous  Once 05/07/21 1001 05/08/21 1012   05/07/21 0515  cefTRIAXone (ROCEPHIN) 1 g in sodium chloride 0.9 % 100 mL IVPB        1 g 200 mL/hr over 30 Minutes Intravenous  Once 05/07/21 0506 05/07/21 0631       Objective: Vitals:   05/16/21 0525 05/16/21 0738  BP: 120/80 132/79  Pulse: 95 (!) 105  Resp: 18 16  Temp: 97.9 F (36.6 C) 98.2 F (36.8 C)  SpO2: 91% 94%    Intake/Output Summary (Last 24 hours) at 05/16/2021 1248 Last data filed at 05/16/2021 0900 Gross per 24 hour  Intake 60 ml  Output 250 ml  Net -190 ml    Filed Weights   05/06/21 1914 05/08/21 0400  Weight: 90.7 kg 89.5 kg   Weight change:  Body mass index is 29.14 kg/m.   Physical Exam: General exam: Pleasant elderly Caucasian female.  Propped up in bed.  Not in physical distress Skin: No rashes, lesions or ulcers. HEENT: Atraumatic, normocephalic, no obvious bleeding Lungs: Clear to auscultation bilaterally CVS: Mild tachycardia, regular rhythm, no murmur GI/Abd soft, nontender, nondistended, bowel sound present CNS: Alert, awake, oriented to place, person and time. Psychiatry: Mood appropriate Extremities: No pedal edema, no calf tenderness  Data Review: I have personally reviewed the laboratory data and studies available.  F/u labs ordered Unresulted Labs (From admission, onward)     Start     Ordered   05/14/21 0500  Creatinine, serum  (enoxaparin (LOVENOX)    CrCl >/= 30  ml/min)  Weekly,   STAT     Comments: while on enoxaparin therapy    05/07/21 1000            Signed, Terrilee Croak, MD [facility] 05/16/2021

## 2021-05-16 NOTE — Plan of Care (Signed)

## 2021-05-17 DIAGNOSIS — N3000 Acute cystitis without hematuria: Secondary | ICD-10-CM | POA: Diagnosis not present

## 2021-05-17 LAB — RESP PANEL BY RT-PCR (FLU A&B, COVID) ARPGX2
Influenza A by PCR: NEGATIVE
Influenza B by PCR: NEGATIVE
SARS Coronavirus 2 by RT PCR: NEGATIVE

## 2021-05-17 LAB — GLUCOSE, CAPILLARY
Glucose-Capillary: 131 mg/dL — ABNORMAL HIGH (ref 70–99)
Glucose-Capillary: 189 mg/dL — ABNORMAL HIGH (ref 70–99)
Glucose-Capillary: 176 mg/dL — ABNORMAL HIGH (ref 70–99)

## 2021-05-17 NOTE — Plan of Care (Signed)

## 2021-05-17 NOTE — TOC Progression Note (Signed)
Transition of Care [facility]) - Progression Note    Patient Details  Name: Danielle Nixon MRN: [id number] Date of Birth: 09/23/1935  Transition of Care [facility]) CM/SW [place], LCSW Phone Number: 05/17/2021, 12:38 PM  Clinical Narrative:   Requested updated COVID test. Plan for DC to [facility].    Expected Discharge Plan: [place] Barriers to Discharge: Continued Medical Work up  Expected Discharge Plan and Services Expected Discharge Plan: [place] Choice: [place] arrangements for the past 2 months: Single Family Home                                       Social Determinants of Health (SDOH) Interventions    Readmission Risk Interventions No flowsheet data found.

## 2021-05-17 NOTE — Progress Notes (Signed)
PROGRESS NOTE  VANDA WASKEY  DOB: December 24, 1935  PCP: Baxter Hire, MD WGN:[id number]  DOA: 05/07/2021  LOS: 10 days  Hospital Day: 25  Chief Complaint  Patient presents with   Fall    Brief narrative: PAMELYN BANCROFT is a 85 y.o. female with PMH significant for DM2, HTN, breast cancer status postlumpectomy and radiation. Patient was brought to the ED from home on 11/24 for evaluation following multiple falls in the last couple of days.  Patient lives alone, uses a rolling walker and a cane for ambulation.  She was having progressive loss of balance and multiple falls leading to bruises in her arms and legs.  Urinalysis showed pyuria Skeletal survey did not show any evidence of fracture. Patient was admitted to hospital service IV antibiotics started for UTI Urine culture grew Proteus penneri. See below for details  Subjective: Patient was seen and examined this morning.   Alert, awake, oriented to place person and time this morning but physically seems weak. Per nursing staff, patient has been in and out of bed to the chair yesterday and today.  Assessment/Plan: Proteus penneri UTI -Completed a week course of IV Rocephin.  During the course of antibiotics, patient had breakthrough fevers and hence IV vancomycin was added.  No other source of infection identified.  On 11/30, I stopped IV vancomycin and IV Rocephin.  No fever since without antibiotics.    Acute metabolic encephalopathy -Intermittent acute delirium likely due to prolonged hospitalization. -This morning, patient remains alert, awake, oriented x3.    LLE swelling -has appearance of hematoma as per ultrasound.  This is likely because of fall.    DM2 -Diet controlled, HbA1c 6.4.   Sinus tachycardia Essential hypertension -Heart rate and blood pressure stable on Coreg, irbesartan and HCTZ.    Thrombocytopenia -resolved  Constipation -Continue bowel regimen with Senokot scheduled and MiraLAX as  needed.  Frequent falls  -Due to impaired mobility and UTI.  PT eval obtained.  SNF placement recommended.   -Likely for discharge Monday.   Mobility: Encourage ambulation  Living condition: was living at home. Goals of care:   Code Status: Full Code  Nutritional status: Body mass index is 29.14 kg/m.     Diet:  Diet Order             Diet heart healthy/carb modified Room service appropriate? Yes; Fluid consistency: Thin  Diet effective now                  DVT prophylaxis:  enoxaparin (LOVENOX) injection 40 mg Start: 05/13/21 2200   Antimicrobials: None currently Fluid: Not on IV fluid Consultants: None Family Communication: None at bedside  Status is: Inpatient  Remains inpatient appropriate because: Pending SNF placement Dispo: The patient is from: Home              Anticipated d/c is to: SNF tomorrow              Patient currently is medically stable to d/c.   Difficult to place patient No     Infusions:     Scheduled Meds:  amitriptyline  50 mg Oral QHS   atorvastatin  10 mg Oral QPM   carvedilol  3.125 mg Oral BID WC   enoxaparin (LOVENOX) injection  40 mg Subcutaneous Q24H   irbesartan  75 mg Oral Daily   And   hydrochlorothiazide  12.5 mg Oral Daily   mouth rinse  15 mL Mouth Rinse BID   polyethylene  glycol  17 g Oral Daily   senna-docusate  1 tablet Oral QHS    PRN meds: acetaminophen, haloperidol lactate, ondansetron **OR** ondansetron (ZOFRAN) IV   Antimicrobials: Anti-infectives (From admission, onward)    Start     Dose/Rate Route Frequency Ordered Stop   05/12/21 1504  vancomycin variable dose per unstable renal function (pharmacist dosing)  Status:  Discontinued         Does not apply See admin instructions 05/12/21 1505 05/13/21 0836   05/11/21 2300  vancomycin (VANCOREADY) IVPB 1500 mg/300 mL  Status:  Discontinued        1,500 mg 150 mL/hr over 120 Minutes Intravenous Every 24 hours 05/10/21 2209 05/11/21 1246   05/11/21 2300   vancomycin (VANCOREADY) IVPB 1250 mg/250 mL  Status:  Discontinued        1,250 mg 166.7 mL/hr over 90 Minutes Intravenous Every 24 hours 05/11/21 1246 05/12/21 1505   05/10/21 2300  vancomycin (VANCOREADY) IVPB 2000 mg/400 mL        2,000 mg 200 mL/hr over 120 Minutes Intravenous  Once 05/10/21 2200 05/11/21 0217   05/09/21 1000  cefTRIAXone (ROCEPHIN) 1 g in sodium chloride 0.9 % 100 mL IVPB        1 g 200 mL/hr over 30 Minutes Intravenous Every 24 hours 05/08/21 1351 05/13/21 0945   05/08/21 0600  cefTRIAXone (ROCEPHIN) 1 g in sodium chloride 0.9 % 100 mL IVPB        1 g 200 mL/hr over 30 Minutes Intravenous  Once 05/07/21 1001 05/08/21 1012   05/07/21 0515  cefTRIAXone (ROCEPHIN) 1 g in sodium chloride 0.9 % 100 mL IVPB        1 g 200 mL/hr over 30 Minutes Intravenous  Once 05/07/21 0506 05/07/21 0631       Objective: Vitals:   05/17/21 0550 05/17/21 0802  BP: 131/71 131/78  Pulse: 95 93  Resp: 20 18  Temp: 97.7 F (36.5 C) 97.7 F (36.5 C)  SpO2: 94% 95%    Intake/Output Summary (Last 24 hours) at 05/17/2021 1154 Last data filed at 05/17/2021 1006 Gross per 24 hour  Intake 720 ml  Output 450 ml  Net 270 ml   Filed Weights   05/06/21 1914 05/08/21 0400  Weight: 90.7 kg 89.5 kg   Weight change:  Body mass index is 29.14 kg/m.   Physical Exam: General exam: Pleasant elderly Caucasian female.  Propped up in bed.  Not in physical distress Skin: No rashes, lesions or ulcers. HEENT: Atraumatic, normocephalic, no obvious bleeding Lungs: Clear to auscultation bilaterally CVS: Mild tachycardia, regular rhythm, no murmur GI/Abd soft, nontender, nondistended, bowel sound present CNS: Alert, awake, oriented to place, person and time. Psychiatry: Mood appropriate Extremities: No pedal edema, no calf tenderness  Data Review: I have personally reviewed the laboratory data and studies available.  F/u labs ordered Unresulted Labs (From admission, onward)     Start      Ordered   05/17/21 1155  Resp Panel by RT-PCR (Flu A&B, Covid) Nasopharyngeal Swab  (Tier 2 - Symptomatic/asymptomatic)  Once,   R        05/17/21 1154   05/14/21 0500  Creatinine, serum  (enoxaparin (LOVENOX)    CrCl >/= 30 ml/min)  Weekly,   STAT     Comments: while on enoxaparin therapy    05/07/21 1000            Signed, Terrilee Croak, MD [facility] 05/17/2021

## 2021-05-18 DIAGNOSIS — N3000 Acute cystitis without hematuria: Secondary | ICD-10-CM | POA: Diagnosis not present

## 2021-05-18 LAB — GLUCOSE, CAPILLARY
Glucose-Capillary: 144 mg/dL — ABNORMAL HIGH (ref 70–99)
Glucose-Capillary: 261 mg/dL — ABNORMAL HIGH (ref 70–99)
Glucose-Capillary: 155 mg/dL — ABNORMAL HIGH (ref 70–99)
Glucose-Capillary: 128 mg/dL — ABNORMAL HIGH (ref 70–99)

## 2021-05-18 MED ORDER — CARVEDILOL 3.125 MG PO TABS: 3.1250 mg | ORAL_TABLET | Freq: Two times a day (BID) | ORAL | Status: DC

## 2021-05-18 MED ORDER — POLYETHYLENE GLYCOL 3350 17 G PO PACK: 17.0000 g | PACK | Freq: Every day | ORAL | 0 refills | Status: DC

## 2021-05-18 MED ORDER — SENNOSIDES-DOCUSATE SODIUM 8.6-50 MG PO TABS: 1.0000 | ORAL_TABLET | Freq: Every day | ORAL | Status: DC

## 2021-05-18 NOTE — Progress Notes (Signed)
Report called to [facility] at [facility]

## 2021-05-18 NOTE — Progress Notes (Signed)
Physical Therapy Treatment Patient Details Name: Danielle Nixon MRN: [id number] DOB: [birth year] Today's Date: 05/18/2021   History of Present Illness Danielle Nixon is a 85 y.o. female with history of breast cancer status postlumpectomy and radiation, hypertension, diabetes who presents to the emergency department with her son after she has had several falls over the past few days.    PT Comments    Pt received upright in bed. Agreeable to PT. Improving in reduced assist level only requiring supervision and HOB maximally elevated to transfer to sitting EOB. Performed seated therex prior to standing and ambulating to recliner with mod cuing for form/technique. Does rely on bed elevated and significant verbal cues for safe hand placement and minA to stand to RW with poor carryover with hand placement leading to PT to maintain UE on RW to prevent L lateral tipping. PT amb 3' into recliner with good RW sequencing and hand placement with poor eccentric control with descent.  Pt continues to require STR due to poor safety awareness and deficits in LE strength leading to poor tolerance for transfers and ambulating beyond short household distances.    Recommendations for follow up therapy are one component of a multi-disciplinary discharge planning process, led by the attending physician.  Recommendations may be updated based on patient status, additional functional criteria and insurance authorization.  Follow Up Recommendations  Skilled nursing-short term rehab (<3 hours/day)     Assistance Recommended at Discharge Frequent or constant Supervision/Assistance  Equipment Recommendations   (tbd by next venue of care)    Recommendations for Other Services       Precautions / Restrictions Precautions Precautions: Fall Restrictions Weight Bearing Restrictions: No     Mobility  Bed Mobility Overal bed mobility: Needs Assistance Bed Mobility: Supine to Sit     Supine to sit:  Supervision;HOB elevated     General bed mobility comments: in recliner post session. Increased time to perform with HOB max elevated. Despite increased time, appears like simple task for pt with set up mentioned above. Patient Response: Cooperative  Transfers Overall transfer level: Needs assistance Equipment used: Rolling walker (2 wheels) Transfers: Bed to chair/wheelchair/BSC Sit to Stand: Min assist;From elevated surface           General transfer comment: minA and cuing for hand placement with poor carryover. Required PT hand on RW to prevent tipping of RW.    Ambulation/Gait Ambulation/Gait assistance: Min guard Gait Distance (Feet): 3 Feet Assistive device: Rolling walker (2 wheels) Gait Pattern/deviations: Narrow base of support;Trunk flexed;Shuffle;Decreased step length - right;Decreased stance time - left           Stairs             Wheelchair Mobility    Modified Rankin (Stroke Patients Only)       Balance Overall balance assessment: Needs assistance Sitting-balance support: Feet supported;Bilateral upper extremity supported Sitting balance-Leahy Scale: Fair Sitting balance - Comments: Requires SUE support with seated therex with minor post lean Postural control: Posterior lean Standing balance support: Reliant on assistive device for balance;Bilateral upper extremity supported Standing balance-Leahy Scale: Fair Standing balance comment: Requires BUE support on RW. No buckling of knees                            Cognition Arousal/Alertness: Awake/alert Behavior During Therapy: WFL for tasks assessed/performed Overall Cognitive Status: No family/caregiver present to determine baseline cognitive functioning  General Comments: Pt remains confused as she has no recollection last week she was having significant difficulty just to transfer supine to sitting EOB        Exercises General  Exercises - Lower Extremity Long Arc Quad: AROM;Strengthening;Both;10 reps;Seated Hip Flexion/Marching: AROM;Both;10 reps;Seated    General Comments        Pertinent Vitals/Pain Pain Assessment: Faces Faces Pain Scale: No hurt    Home Living                          Prior Function            PT Goals (current goals can now be found in the care plan section) Acute Rehab PT Goals Patient Stated Goal: to get better PT Goal Formulation: With patient Time For Goal Achievement: 05/22/21 Potential to Achieve Goals: Fair Progress towards PT goals: Progressing toward goals    Frequency    Min 2X/week      PT Plan Current plan remains appropriate    Co-evaluation              AM-PAC PT "6 Clicks" Mobility   Outcome Measure  Help needed turning from your back to your side while in a flat bed without using bedrails?: A Lot Help needed moving from lying on your back to sitting on the side of a flat bed without using bedrails?: A Lot Help needed moving to and from a bed to a chair (including a wheelchair)?: A Lot Help needed standing up from a chair using your arms (e.g., wheelchair or bedside chair)?: A Lot Help needed to walk in hospital room?: A Lot Help needed climbing 3-5 steps with a railing? : Total 6 Click Score: 11    End of Session Equipment Utilized During Treatment: Gait belt Activity Tolerance: Patient tolerated treatment well Patient left: with call bell/phone within reach;in chair Nurse Communication: Mobility status PT Visit Diagnosis: Other abnormalities of gait and mobility (R26.89);Muscle weakness (generalized) (M62.81);Pain     Time: 1046-1101 PT Time Calculation (min) (ACUTE ONLY): 15 min  Charges:  $Therapeutic Exercise: 8-22 mins                    Salem Caster. Fairly IV, PT, DPT Physical Therapist- [facility]  05/18/2021, 12:10 PM

## 2021-05-18 NOTE — Care Management Important Message (Signed)
Important Message  Patient Details  Name: Danielle Nixon MRN: [id number] Date of Birth: 1935/08/29   Medicare Important Message Given:  Yes     [name] 05/18/2021, 11:12 AM

## 2021-05-18 NOTE — Discharge Summary (Addendum)
Physician Discharge Summary  Danielle Nixon RXV:[id number] DOB: [birth year] DOA: 05/07/2021  PCP: Baxter Hire, MD  Admit date: 05/07/2021 Discharge date: 05/19/2021  Admitted From: Home Discharge disposition: SNF   Code Status: Full Code   Discharge Diagnosis:   Principal Problem:   UTI (urinary tract infection) Active Problems:   DM (diabetes mellitus) ([place])   Hypertension   Frequent falls   Chief Complaint  Patient presents with   Fall    Brief narrative: Danielle Nixon is a 85 y.o. female with PMH significant for DM2, HTN, breast cancer status postlumpectomy and radiation. Patient was brought to the ED from home on 11/24 for evaluation following multiple falls in the last couple of days.  Patient lives alone, uses a rolling walker and a cane for ambulation.  She was having progressive loss of balance and multiple falls leading to bruises in her arms and legs.  Urinalysis showed pyuria Skeletal survey did not show any evidence of fracture. Patient was admitted to hospital service IV antibiotics started for UTI Urine culture grew Proteus penneri. See below for details  Subjective: Patient was seen and examined this morning.   Sitting up in chair.  Not in distress.  No new symptoms.  Alert, awake, oriented x3.  Hospital course: Proteus penneri UTI -Completed a week course of IV Rocephin.  During the course of antibiotics, patient had breakthrough fevers and hence IV vancomycin was added.  No other source of infection identified.  On 11/30, I stopped IV vancomycin and IV Rocephin.  No fever since without antibiotics.    Acute metabolic encephalopathy -Intermittent acute delirium likely due to prolonged hospitalization. -For the past 3 days, patient has remained alert, awake, oriented x3.    Acute nausea/vomiting -On 2/5, patient was planned for discharge.  However she had an acute episode of vomiting.  Discharge was held and she was monitored for further  24 hours.  No recurrence of vomiting.  She was able to tolerate diet.  No other symptoms.  Okay to discharge today 12/6.  LLE swelling -has appearance of hematoma as per ultrasound.  This is likely because of fall.    DM2 -Diet controlled, HbA1c 6.4.   Sinus tachycardia Essential hypertension -Heart rate and blood pressure stable. -Discharge on Coreg, valsartan and HCTZ.    Hyperlipidemia -Continue Lipitor  Thrombocytopenia -resolved  Constipation -Continue bowel regimen with Senokot scheduled and MiraLAX as needed.  Frequent falls  -Due to impaired mobility and UTI.  PT eval obtained.  SNF placement recommended.    Mobility: Encourage ambulation  Living condition: was living at home. Goals of care:   Code Status: Full Code  Nutritional status: Body mass index is 29.14 kg/m.  Discharge Medications:   Allergies as of 05/19/2021       Reactions   Ciprofloxacin Nausea And Vomiting   Rash also   Doxycycline Rash   Nitrofurantoin Monohyd Macro Rash   Penicillins Rash   Has taken recently without problems (in 2016) Did it involve swelling of the face/tongue/throat, SOB, or low BP? No Did it involve sudden or severe rash/hives, skin peeling, or any reaction on the inside of your mouth or nose? Unknown Did you need to seek medical attention at a hospital or doctor's office? Yes When did it last happen?      2016 If all above answers are "NO", may proceed with cephalosporin use. Other reaction(s): Unknown Has taken recently without problems Has taken recently without problems (in 2016) Did it  involve swelling of the face/tongue/throat, SOB, or low BP? No Did it involve sudden or severe rash/hives, skin peeling, or any reaction on the inside of your mouth or nose? Unknown Did you need to seek medical attention at a hospital or doctor's office? Yes When did it last happen?      2016 If all above answers are "NO", may proceed with cephalosporin use.   Pseudoephedrine Hcl  Other (See Comments)   Insomnia   Sulfa Antibiotics Rash   Makes patient extremely hyper   Terazosin Other (See Comments)   unknown        Medication List     STOP taking these medications    clindamycin 150 MG capsule Commonly known as: CLEOCIN       TAKE these medications    amitriptyline 50 MG tablet Commonly known as: ELAVIL Take 50 mg by mouth at bedtime.   atorvastatin 10 MG tablet Commonly known as: LIPITOR Take 10 mg by mouth every evening.   carvedilol 3.125 MG tablet Commonly known as: COREG Take 1 tablet (3.125 mg total) by mouth 2 (two) times daily with a meal.   gabapentin 300 MG capsule Commonly known as: NEURONTIN Take 600 mg by mouth 3 (three) times daily.   polyethylene glycol 17 g packet Commonly known as: MIRALAX / GLYCOLAX Take 17 g by mouth daily.   senna-docusate 8.6-50 MG tablet Commonly known as: Senokot-S Take 1 tablet by mouth at bedtime.   valsartan-hydrochlorothiazide 80-12.5 MG tablet Commonly known as: DIOVAN-HCT Take 1 tablet by mouth daily.        Wound care:     Discharge Instructions:   Discharge Instructions     Call MD for:  difficulty breathing, headache or visual disturbances   Complete by: As directed    Call MD for:  extreme fatigue   Complete by: As directed    Call MD for:  hives   Complete by: As directed    Call MD for:  persistant dizziness or light-headedness   Complete by: As directed    Call MD for:  persistant nausea and vomiting   Complete by: As directed    Call MD for:  severe uncontrolled pain   Complete by: As directed    Call MD for:  temperature >100.4   Complete by: As directed    Diet general   Complete by: As directed    Discharge instructions   Complete by: As directed    General discharge instructions:  Follow with Primary MD Baxter Hire, MD in 7 days   Get CBC/BMP checked in next visit within 1 week by PCP or SNF MD. (We routinely change or add medications that can  affect your baseline labs and fluid status, therefore we recommend that you get the mentioned basic workup next visit with your PCP, your PCP may decide not to get them or add new tests based on their clinical decision)  On your next visit with your PCP, please get your medicines reviewed and adjusted.  Please request your PCP  to go over all hospital tests, procedures, radiology results at the follow up, please get all Hospital records sent to your PCP by signing hospital release before you go home.  Activity: As tolerated with Full fall precautions use walker/cane & assistance as needed  Avoid using any recreational substances like cigarette, tobacco, alcohol, or non-prescribed drug.  If you experience worsening of your admission symptoms, develop shortness of breath, life threatening emergency, suicidal or homicidal  thoughts you must seek medical attention immediately by calling 911 or calling your MD immediately  if symptoms less severe.  You must read complete instructions/literature along with all the possible adverse reactions/side effects for all the medicines you take and that have been prescribed to you. Take any new medicine only after you have completely understood and accepted all the possible adverse reactions/side effects.   Do not drive, operate heavy machinery, perform activities at heights, swimming or participation in water activities or provide baby sitting services if your were admitted for syncope or siezures until you have seen by Primary MD or a Neurologist and advised to do so again.  Do not drive when taking Pain medications.  Do not take more than prescribed Pain, Sleep and Anxiety Medications  Wear Seat belts while driving.  Please note You were cared for by a hospitalist during your hospital stay. If you have any questions about your discharge medications or the care you received while you were in the hospital after you are discharged, you can call the unit and asked  to speak with the hospitalist on call if the hospitalist that took care of you is not available. Once you are discharged, your primary care physician will handle any further medical issues. Please note that NO REFILLS for any discharge medications will be authorized once you are discharged, as it is imperative that you return to your primary care physician (or establish a relationship with a primary care physician if you do not have one) for your aftercare needs so that they can reassess your need for medications and monitor your lab values.   Increase activity slowly   Complete by: As directed        Follow ups:    Contact information for follow-up providers     Baxter Hire, MD Follow up.   Specialty: Internal Medicine Why: snf will have to make ([place]) Contact information: [place] [address] [phone]              Contact information for after-discharge care     Destination     [facility] .   Service: Skilled Nursing Contact information: [address] [place] [phone]                     Discharge Exam:   Vitals:   05/18/21 1538 05/18/21 1949 05/19/21 0425 05/19/21 0749  BP: 113/72 100/62 103/70 106/74  Pulse: 91 93 85 85  Resp: 16 20 20    Temp: 97.8 F (36.6 C) 98.1 F (36.7 C) (!) 97.5 F (36.4 C) (!) 97.3 F (36.3 C)  TempSrc: Oral Oral Oral Oral  SpO2: 94% 92% 93% 93%  Weight:      Height:        Body mass index is 29.14 kg/m.  General exam: Pleasant elderly Caucasian female.  Sitting up in chair.  Not in distress. Skin: No rashes, lesions or ulcers. HEENT: Atraumatic, normocephalic, no obvious bleeding Lungs: Clear to auscultation bilaterally CVS: Mild tachycardia, regular rhythm, no murmur GI/Abd soft, nontender, nondistended, bowel sound present CNS: Alert, awake, oriented to place, person and time. Psychiatry: Mood  appropriate Extremities: No pedal edema, no calf tenderness  Time coordinating discharge: 35 minutes   The results of significant diagnostics from this hospitalization (including imaging, microbiology, ancillary and laboratory) are listed below for reference.    Procedures and Diagnostic Studies:   DG Tibia/Fibula Left  Result Date: 05/07/2021 CLINICAL DATA:  Recent falls with right leg pain EXAM: LEFT TIBIA AND FIBULA - 2 VIEW COMPARISON:  None. FINDINGS: Subcutaneous reticulation especially seen at the lateral leg. Total knee arthroplasty which is well seated. Generalized osteopenia. No acute fracture or subluxation. Midfoot collapse, although a nonweightbearing lateral view. IMPRESSION: Soft tissue swelling without acute osseous finding. Electronically Signed   By: Jorje Guild M.D.   On: 05/07/2021 04:54   DG Knee Complete 4 Views Right  Result Date: 05/07/2021 CLINICAL DATA:  Recent falls.  Right leg pain EXAM: RIGHT KNEE - COMPLETE 4+ VIEW COMPARISON:  09/22/2006 FINDINGS: No evidence of fracture, dislocation, or large joint effusion. Total knee arthroplasty which is well seated. IMPRESSION: No acute finding.  Unremarkable total knee arthroplasty. Electronically Signed   By: Jorje Guild M.D.   On: 05/07/2021 04:53     Labs:   Basic Metabolic Panel: Recent Labs  Lab 05/13/21 0552 05/14/21 0430 05/15/21 0512 05/16/21 0520  NA 135 136 136 138  K 4.6 4.1 3.6 3.5  CL 104 103 101 100  CO2 26 26 26 27   GLUCOSE 139* 130* 133* 123*  BUN 41* 29* 24* 38*  CREATININE 0.92 0.65 0.64 0.86  CALCIUM 9.0 9.1 9.3 9.5   GFR Estimated Creatinine Clearance: 59.1 mL/min (by C-G formula based on SCr of 0.86 mg/dL). Liver Function Tests: No results for input(s): AST, ALT, ALKPHOS, BILITOT, PROT, ALBUMIN in the last 168 hours. No results for input(s): LIPASE, AMYLASE in the last 168 hours. No results for input(s): AMMONIA in the last 168 hours. Coagulation profile No results for  input(s): INR, PROTIME in the last 168 hours.  CBC: Recent Labs  Lab 05/13/21 0552 05/14/21 0430 05/15/21 0512 05/16/21 0520  WBC 7.7 7.3 7.4 8.0  NEUTROABS  --  5.2 5.4 4.5  HGB 11.6* 11.1* 11.9* 12.1  HCT 35.7* 34.4* 35.6* 37.4  MCV 97.3 95.8 92.5 94.4  PLT 194 205 238 277   Cardiac Enzymes: No results for input(s): CKTOTAL, CKMB, CKMBINDEX, TROPONINI in the last 168 hours. BNP: Invalid input(s): POCBNP CBG: Recent Labs  Lab 05/18/21 1137 05/18/21 1617 05/18/21 2117 05/19/21 0748 05/19/21 1145  GLUCAP 261* 155* 128* 142* 222*   D-Dimer No results for input(s): DDIMER in the last 72 hours. Hgb A1c No results for input(s): HGBA1C in the last 72 hours. Lipid Profile No results for input(s): CHOL, HDL, LDLCALC, TRIG, CHOLHDL, LDLDIRECT in the last 72 hours. Thyroid function studies No results for input(s): TSH, T4TOTAL, T3FREE, THYROIDAB in the last 72 hours.  Invalid input(s): FREET3 Anemia work up No results for input(s): VITAMINB12, FOLATE, FERRITIN, TIBC, IRON, RETICCTPCT in the last 72 hours. Microbiology Recent Results (from the past 240 hour(s))  CULTURE, BLOOD (ROUTINE X 2) w Reflex to ID Panel     Status: None   Collection Time: 05/11/21  8:05 AM   Specimen: BLOOD  Result Value Ref Range Status   Specimen Description BLOOD LEFT HAND  Final   Special Requests   Final    BOTTLES DRAWN AEROBIC AND ANAEROBIC Blood Culture adequate volume   Culture   Final    NO GROWTH 5 DAYS Performed at [facility], [address] [place], [place] [address]    Report Status 05/16/2021 FINAL  Final  CULTURE, BLOOD (ROUTINE X 2) w Reflex to ID Panel     Status: None   Collection Time: 05/11/21  8:05 AM   Specimen: BLOOD  Result Value Ref Range  Status   Specimen Description BLOOD LEFT AC  Final   Special Requests   Final    BOTTLES DRAWN AEROBIC AND ANAEROBIC Blood Culture adequate volume   Culture   Final    NO GROWTH 5 DAYS Performed at [facility], [address] [place], [place] [address]    Report Status 05/16/2021 FINAL  Final  Resp Panel by RT-PCR (Flu A&B, Covid) Nasopharyngeal Swab     Status: None   Collection Time: 05/14/21  1:29 PM   Specimen: Nasopharyngeal Swab; Nasopharyngeal(NP) swabs in vial transport medium  Result Value Ref Range Status   SARS Coronavirus 2 by RT PCR NEGATIVE NEGATIVE Final    Comment: (NOTE) SARS-CoV-2 target nucleic acids are NOT DETECTED.  The SARS-CoV-2 RNA is generally detectable in upper respiratory specimens during the acute phase of infection. The lowest concentration of SARS-CoV-2 viral copies this assay can detect is 138 copies/mL. A negative result does not preclude SARS-Cov-2 infection and should not be used as the sole basis for treatment or other patient management decisions. A negative result may occur with  improper specimen collection/handling, submission of specimen other than nasopharyngeal swab, presence of viral mutation(s) within the areas targeted by this assay, and inadequate number of viral copies(<138 copies/mL). A negative result must be combined with clinical observations, patient history, and epidemiological information. The expected result is Negative.  Fact Sheet for Patients:  EntrepreneurPulse.com.au  Fact Sheet for Healthcare Providers:  IncredibleEmployment.be  This test is no t yet approved or cleared by the [place] FDA and  has been authorized for detection and/or diagnosis of SARS-CoV-2 by FDA under an Emergency Use Authorization (EUA). This EUA will remain  in effect (meaning this test can be used) for the duration of the COVID-19 declaration under Section 564(b)(1) of the Act, 21 U.S.C.section 360bbb-3(b)(1), unless the authorization is terminated  or revoked sooner.       Influenza A by PCR NEGATIVE NEGATIVE Final   Influenza B by PCR NEGATIVE NEGATIVE Final    Comment: (NOTE) The Xpert Xpress  SARS-CoV-2/FLU/RSV plus assay is intended as an aid in the diagnosis of influenza from Nasopharyngeal swab specimens and should not be used as a sole basis for treatment. Nasal washings and aspirates are unacceptable for Xpert Xpress SARS-CoV-2/FLU/RSV testing.  Fact Sheet for Patients: EntrepreneurPulse.com.au  Fact Sheet for Healthcare Providers: IncredibleEmployment.be  This test is not yet approved or cleared by the [place] FDA and has been authorized for detection and/or diagnosis of SARS-CoV-2 by FDA under an Emergency Use Authorization (EUA). This EUA will remain in effect (meaning this test can be used) for the duration of the COVID-19 declaration under Section 564(b)(1) of the Act, 21 U.S.C. section 360bbb-3(b)(1), unless the authorization is terminated or revoked.  Performed at [facility], [address] [place],  [address]   Resp Panel by RT-PCR (Flu A&B, Covid) Nasopharyngeal Swab     Status: None   Collection Time: 05/17/21  3:52 PM   Specimen: Nasopharyngeal Swab; Nasopharyngeal(NP) swabs in vial transport medium  Result Value Ref Range Status   SARS Coronavirus 2 by RT PCR NEGATIVE NEGATIVE Final    Comment: (NOTE) SARS-CoV-2 target nucleic acids are NOT DETECTED.  The SARS-CoV-2 RNA is generally detectable in upper respiratory specimens during the acute phase of infection. The lowest concentration of SARS-CoV-2 viral copies this assay can detect is 138 copies/mL. A negative result does not preclude SARS-Cov-2 infection and should not be used as the sole  basis for treatment or other patient management decisions. A negative result may occur with  improper specimen collection/handling, submission of specimen other than nasopharyngeal swab, presence of viral mutation(s) within the areas targeted by this assay, and inadequate number of viral copies(<138 copies/mL). A negative result must be combined  with clinical observations, patient history, and epidemiological information. The expected result is Negative.  Fact Sheet for Patients:  EntrepreneurPulse.com.au  Fact Sheet for Healthcare Providers:  IncredibleEmployment.be  This test is no t yet approved or cleared by the [place] FDA and  has been authorized for detection and/or diagnosis of SARS-CoV-2 by FDA under an Emergency Use Authorization (EUA). This EUA will remain  in effect (meaning this test can be used) for the duration of the COVID-19 declaration under Section 564(b)(1) of the Act, 21 U.S.C.section 360bbb-3(b)(1), unless the authorization is terminated  or revoked sooner.       Influenza A by PCR NEGATIVE NEGATIVE Final   Influenza B by PCR NEGATIVE NEGATIVE Final    Comment: (NOTE) The Xpert Xpress SARS-CoV-2/FLU/RSV plus assay is intended as an aid in the diagnosis of influenza from Nasopharyngeal swab specimens and should not be used as a sole basis for treatment. Nasal washings and aspirates are unacceptable for Xpert Xpress SARS-CoV-2/FLU/RSV testing.  Fact Sheet for Patients: EntrepreneurPulse.com.au  Fact Sheet for Healthcare Providers: IncredibleEmployment.be  This test is not yet approved or cleared by the [place] FDA and has been authorized for detection and/or diagnosis of SARS-CoV-2 by FDA under an Emergency Use Authorization (EUA). This EUA will remain in effect (meaning this test can be used) for the duration of the COVID-19 declaration under Section 564(b)(1) of the Act, 21 U.S.C. section 360bbb-3(b)(1), unless the authorization is terminated or revoked.  Performed at [facility], [address] [place], [place] [address]      Signed: Terrilee Croak  [facility] 05/19/2021, 1:17 PM

## 2021-05-18 NOTE — Progress Notes (Signed)
Patient had a very large emesis. Dr Pietro Cassis notified by [place] Scrape, Social Worker. Discharge cancelled for today

## 2021-05-18 NOTE — TOC Progression Note (Signed)
Transition of Care [facility]) - Progression Note    Patient Details  Name: Danielle Nixon MRN: [id number] Date of Birth: [birth year]  Transition of Care [facility]) CM/SW [place], LCSW Phone Number: 05/18/2021, 1:29 PM  Clinical Narrative: Went into room to tell patient EMS transport was being arranged and patient had vomited on herself. MD and RN are aware. Discharge on hold for today. Called son and provided update. [employer] said authorization is valid for 5 business days once approved. Authorization was obtained on 12/2.    Expected Discharge Plan: [place] Barriers to Discharge: Continued Medical Work up  Expected Discharge Plan and Services Expected Discharge Plan: [place] Choice: [place] arrangements for the past 2 months: Single Family Home Expected Discharge Date: 05/18/21                                     Social Determinants of Health (SDOH) Interventions    Readmission Risk Interventions No flowsheet data found.

## 2021-05-19 DIAGNOSIS — I10 Essential (primary) hypertension: Secondary | ICD-10-CM | POA: Diagnosis not present

## 2021-05-19 DIAGNOSIS — F330 Major depressive disorder, recurrent, mild: Secondary | ICD-10-CM | POA: Diagnosis not present

## 2021-05-19 DIAGNOSIS — E119 Type 2 diabetes mellitus without complications: Secondary | ICD-10-CM | POA: Diagnosis not present

## 2021-05-19 DIAGNOSIS — Z743 Need for continuous supervision: Secondary | ICD-10-CM | POA: Diagnosis not present

## 2021-05-19 DIAGNOSIS — N390 Urinary tract infection, site not specified: Secondary | ICD-10-CM | POA: Diagnosis not present

## 2021-05-19 DIAGNOSIS — G9341 Metabolic encephalopathy: Secondary | ICD-10-CM | POA: Diagnosis not present

## 2021-05-19 DIAGNOSIS — R41841 Cognitive communication deficit: Secondary | ICD-10-CM | POA: Diagnosis not present

## 2021-05-19 DIAGNOSIS — R0902 Hypoxemia: Secondary | ICD-10-CM | POA: Diagnosis not present

## 2021-05-19 DIAGNOSIS — R4182 Altered mental status, unspecified: Secondary | ICD-10-CM | POA: Diagnosis not present

## 2021-05-19 DIAGNOSIS — Z23 Encounter for immunization: Secondary | ICD-10-CM | POA: Diagnosis not present

## 2021-05-19 DIAGNOSIS — E785 Hyperlipidemia, unspecified: Secondary | ICD-10-CM | POA: Diagnosis not present

## 2021-05-19 DIAGNOSIS — M6281 Muscle weakness (generalized): Secondary | ICD-10-CM | POA: Diagnosis not present

## 2021-05-19 DIAGNOSIS — J9600 Acute respiratory failure, unspecified whether with hypoxia or hypercapnia: Secondary | ICD-10-CM | POA: Diagnosis not present

## 2021-05-19 LAB — GLUCOSE, CAPILLARY
Glucose-Capillary: 142 mg/dL — ABNORMAL HIGH (ref 70–99)
Glucose-Capillary: 222 mg/dL — ABNORMAL HIGH (ref 70–99)

## 2021-05-19 NOTE — Progress Notes (Signed)
Report called to [facility] at [facility]

## 2021-05-19 NOTE — TOC Transition Note (Signed)
Transition of Care [facility]) - CM/SW Discharge Note   Patient Details  Name: Danielle Nixon MRN: [id number] Date of Birth: [birth year]  Transition of Care [facility]) CM/SW Contact:  Beverly Sessions, RN Phone Number: 05/19/2021, 2:07 PM   Clinical Narrative:     Patient will DC to:[place] Anticipated DC date:05/19/21  Family notified:[place] Transport by:EMS  Per MD patient ready for DC to . RN, patient, patient's family, and facility notified of DC. Discharge Summary sent to facility. RN given number for report. DC packet on chart. Ambulance transport requested for patient.  TOC signing off.  Isaias Cowman [facility] [phone]     Barriers to Discharge: Continued Medical Work up   Patient Goals and CMS Choice        Discharge Placement                       Discharge Plan and Services     Post Acute Care Choice: Skilled Nursing Facility                               Social Determinants of Health (SDOH) Interventions     Readmission Risk Interventions No flowsheet data found.

## 2021-05-19 NOTE — Progress Notes (Signed)
Physical Therapy Treatment Patient Details Name: Danielle Nixon MRN: [id number] DOB: [birth year] Today's Date: 05/19/2021   History of Present Illness Danielle Nixon is a 85 y.o. female with history of breast cancer status postlumpectomy and radiation, hypertension, diabetes who presents to the emergency department with her son after she has had several falls over the past few days.    PT Comments    Pt received supine in bed agreeable to treatment. Requires mod VC's throughout supine and seated therex for improved AROM for targeted musculature.  Able to still transfer with supervision and HOB elevated with no noticeable post lean today. Performed x3 STS with RW but relies on heavy B tricep use to stand to RW with bed elevated and minA due to LE weakness. Able to progress amb with chair follow of 8' today requesting seated rest. Pt remains with more motivation and willing to progress OOB mobility with PT. Still will benefit from STR due to LE strength/endurance, poor safety awareness, and standing tolerance deficits.    Recommendations for follow up therapy are one component of a multi-disciplinary discharge planning process, led by the attending physician.  Recommendations may be updated based on patient status, additional functional criteria and insurance authorization.  Follow Up Recommendations  Skilled nursing-short term rehab (<3 hours/day)     Assistance Recommended at Discharge Frequent or constant Supervision/Assistance  Equipment Recommendations   (tbd by next venue of care)    Recommendations for Other Services       Precautions / Restrictions Precautions Precautions: Fall Restrictions Weight Bearing Restrictions: No     Mobility  Bed Mobility Overal bed mobility: Needs Assistance Bed Mobility: Supine to Sit     Supine to sit: Supervision;HOB elevated     General bed mobility comments: In recliner post session Patient Response:  Cooperative  Transfers Overall transfer level: Needs assistance Equipment used: Rolling walker (2 wheels) Transfers: Sit to/from Stand Sit to Stand: Min guard;From elevated surface           General transfer comment: poor hand placement despite cuing. Requires elbow extension to assist in standing up.    Ambulation/Gait Ambulation/Gait assistance: Min guard Gait Distance (Feet): 8 Feet Assistive device: Rolling walker (2 wheels) Gait Pattern/deviations: Narrow base of support;Trunk flexed;Shuffle;Decreased step length - right;Decreased stance time - left       General Gait Details: Shuffling gait pattern.   Stairs             Wheelchair Mobility    Modified Rankin (Stroke Patients Only)       Balance Overall balance assessment: Needs assistance Sitting-balance support: Feet supported;Bilateral upper extremity supported Sitting balance-Leahy Scale: Fair     Standing balance support: Reliant on assistive device for balance;Bilateral upper extremity supported Standing balance-Leahy Scale: Fair Standing balance comment: Requires BUE support on RW. No buckling of knees                            Cognition Arousal/Alertness: Awake/alert Behavior During Therapy: WFL for tasks assessed/performed Overall Cognitive Status: No family/caregiver present to determine baseline cognitive functioning                                          Exercises General Exercises - Lower Extremity Ankle Circles/Pumps: AROM;Both;15 reps;Supine Quad Sets: AROM;Strengthening;Both;15 reps;Supine Long Arc Quad: AROM;Strengthening;Both;10 reps;Seated Hip ABduction/ADduction: AROM;Both;10 reps;Supine  General Comments        Pertinent Vitals/Pain Pain Assessment: Faces Faces Pain Scale: Hurts a little bit Pain Location: R knee Pain Descriptors / Indicators: Discomfort Pain Intervention(s): Limited activity within patient's tolerance;Monitored  during session;Repositioned    Home Living                          Prior Function            PT Goals (current goals can now be found in the care plan section) Acute Rehab PT Goals Patient Stated Goal: to get better PT Goal Formulation: With patient Time For Goal Achievement: 05/22/21 Potential to Achieve Goals: Fair Progress towards PT goals: Progressing toward goals    Frequency    Min 2X/week      PT Plan Current plan remains appropriate    Co-evaluation              AM-PAC PT "6 Clicks" Mobility   Outcome Measure  Help needed turning from your back to your side while in a flat bed without using bedrails?: A Little Help needed moving from lying on your back to sitting on the side of a flat bed without using bedrails?: A Little Help needed moving to and from a bed to a chair (including a wheelchair)?: A Lot Help needed standing up from a chair using your arms (e.g., wheelchair or bedside chair)?: A Lot Help needed to walk in hospital room?: A Lot Help needed climbing 3-5 steps with a railing? : A Lot 6 Click Score: 14    End of Session Equipment Utilized During Treatment: Gait belt Activity Tolerance: Patient tolerated treatment well Patient left: with call bell/phone within reach;in chair Nurse Communication: Mobility status PT Visit Diagnosis: Other abnormalities of gait and mobility (R26.89);Muscle weakness (generalized) (M62.81);Pain Pain - Right/Left: Right Pain - part of body: Knee     Time: 2585-2778 PT Time Calculation (min) (ACUTE ONLY): 23 min  Charges:  $Therapeutic Exercise: 23-37 mins                     Ireene Ballowe M. Fairly IV, PT, DPT Physical Therapist- [facility]  05/19/2021, 10:53 AM

## 2021-05-19 NOTE — Progress Notes (Signed)
Yesterday, patient was planned for discharge.  However she had an acute episode of vomiting.  Discharge was held and she was monitored for further 24 hours.   Briefly seen and examined this morning.  Sitting up in chair.  In pleasant mood.   No recurrence of vomiting.  She was able to tolerate diet.  No other symptoms. Okay to discharge today. [facility] will not bill for today's visit.

## 2021-05-19 NOTE — Plan of Care (Signed)

## 2021-05-20 DIAGNOSIS — E785 Hyperlipidemia, unspecified: Secondary | ICD-10-CM | POA: Diagnosis not present

## 2021-05-20 DIAGNOSIS — E119 Type 2 diabetes mellitus without complications: Secondary | ICD-10-CM | POA: Diagnosis not present

## 2021-05-20 DIAGNOSIS — I10 Essential (primary) hypertension: Secondary | ICD-10-CM | POA: Diagnosis not present

## 2021-05-26 DIAGNOSIS — E119 Type 2 diabetes mellitus without complications: Secondary | ICD-10-CM | POA: Diagnosis not present

## 2021-05-26 DIAGNOSIS — E785 Hyperlipidemia, unspecified: Secondary | ICD-10-CM | POA: Diagnosis not present

## 2021-05-26 DIAGNOSIS — F330 Major depressive disorder, recurrent, mild: Secondary | ICD-10-CM | POA: Diagnosis not present

## 2021-05-26 DIAGNOSIS — I10 Essential (primary) hypertension: Secondary | ICD-10-CM | POA: Diagnosis not present

## 2021-06-01 DIAGNOSIS — G9341 Metabolic encephalopathy: Secondary | ICD-10-CM | POA: Diagnosis not present

## 2021-06-01 DIAGNOSIS — R4182 Altered mental status, unspecified: Secondary | ICD-10-CM | POA: Diagnosis not present

## 2021-06-02 DIAGNOSIS — I10 Essential (primary) hypertension: Secondary | ICD-10-CM | POA: Diagnosis not present

## 2021-06-02 DIAGNOSIS — E119 Type 2 diabetes mellitus without complications: Secondary | ICD-10-CM | POA: Diagnosis not present

## 2021-06-02 DIAGNOSIS — E785 Hyperlipidemia, unspecified: Secondary | ICD-10-CM | POA: Diagnosis not present

## 2021-06-02 DIAGNOSIS — F330 Major depressive disorder, recurrent, mild: Secondary | ICD-10-CM | POA: Diagnosis not present

## 2021-06-02 DIAGNOSIS — J9600 Acute respiratory failure, unspecified whether with hypoxia or hypercapnia: Secondary | ICD-10-CM | POA: Diagnosis not present

## 2021-07-03 DIAGNOSIS — F0390 Unspecified dementia without behavioral disturbance: Secondary | ICD-10-CM | POA: Diagnosis not present

## 2021-07-03 DIAGNOSIS — R532 Functional quadriplegia: Secondary | ICD-10-CM | POA: Diagnosis not present

## 2021-07-03 DIAGNOSIS — R630 Anorexia: Secondary | ICD-10-CM | POA: Diagnosis not present

## 2021-07-03 DIAGNOSIS — R64 Cachexia: Secondary | ICD-10-CM | POA: Diagnosis not present

## 2021-07-06 DIAGNOSIS — Z79899 Other long term (current) drug therapy: Secondary | ICD-10-CM | POA: Diagnosis not present

## 2021-07-06 DIAGNOSIS — R5381 Other malaise: Secondary | ICD-10-CM | POA: Diagnosis not present

## 2021-07-06 DIAGNOSIS — Z5189 Encounter for other specified aftercare: Secondary | ICD-10-CM | POA: Diagnosis not present

## 2021-07-06 DIAGNOSIS — F0390 Unspecified dementia without behavioral disturbance: Secondary | ICD-10-CM | POA: Diagnosis not present

## 2021-07-13 DIAGNOSIS — F419 Anxiety disorder, unspecified: Secondary | ICD-10-CM | POA: Diagnosis not present

## 2021-07-13 DIAGNOSIS — F0390 Unspecified dementia without behavioral disturbance: Secondary | ICD-10-CM | POA: Diagnosis not present

## 2021-07-13 DIAGNOSIS — G8929 Other chronic pain: Secondary | ICD-10-CM | POA: Diagnosis not present

## 2021-07-13 DIAGNOSIS — Z515 Encounter for palliative care: Secondary | ICD-10-CM | POA: Diagnosis not present

## 2021-08-12 DEATH — deceased
